# Patient Record
Sex: Male | Born: 1940
Health system: Southern US, Community
[De-identification: ages and names within clinical notes are randomized; demographics above are authoritative.]

## PROBLEM LIST (undated history)

## (undated) DIAGNOSIS — E785 Hyperlipidemia, unspecified: Secondary | ICD-10-CM

## (undated) DIAGNOSIS — IMO0001 Reserved for inherently not codable concepts without codable children: Secondary | ICD-10-CM

## (undated) DIAGNOSIS — B001 Herpesviral vesicular dermatitis: Secondary | ICD-10-CM

## (undated) DIAGNOSIS — I639 Cerebral infarction, unspecified: Secondary | ICD-10-CM

## (undated) DIAGNOSIS — R011 Cardiac murmur, unspecified: Secondary | ICD-10-CM

## (undated) DIAGNOSIS — Z5189 Encounter for other specified aftercare: Secondary | ICD-10-CM

## (undated) DIAGNOSIS — K222 Esophageal obstruction: Secondary | ICD-10-CM

## (undated) DIAGNOSIS — F4310 Post-traumatic stress disorder, unspecified: Secondary | ICD-10-CM

## (undated) DIAGNOSIS — D126 Benign neoplasm of colon, unspecified: Secondary | ICD-10-CM

## (undated) DIAGNOSIS — F329 Major depressive disorder, single episode, unspecified: Secondary | ICD-10-CM

## (undated) DIAGNOSIS — I219 Acute myocardial infarction, unspecified: Secondary | ICD-10-CM

## (undated) DIAGNOSIS — K5790 Diverticulosis of intestine, part unspecified, without perforation or abscess without bleeding: Secondary | ICD-10-CM

## (undated) DIAGNOSIS — M4150 Other secondary scoliosis, site unspecified: Secondary | ICD-10-CM

## (undated) DIAGNOSIS — R351 Nocturia: Secondary | ICD-10-CM

## (undated) DIAGNOSIS — T884XXA Failed or difficult intubation, initial encounter: Secondary | ICD-10-CM

## (undated) DIAGNOSIS — Z8719 Personal history of other diseases of the digestive system: Secondary | ICD-10-CM

## (undated) DIAGNOSIS — Z9889 Other specified postprocedural states: Secondary | ICD-10-CM

## (undated) DIAGNOSIS — M4180 Other forms of scoliosis, site unspecified: Secondary | ICD-10-CM

## (undated) DIAGNOSIS — T7840XA Allergy, unspecified, initial encounter: Secondary | ICD-10-CM

## (undated) DIAGNOSIS — N189 Chronic kidney disease, unspecified: Secondary | ICD-10-CM

## (undated) DIAGNOSIS — I259 Chronic ischemic heart disease, unspecified: Secondary | ICD-10-CM

## (undated) DIAGNOSIS — I10 Essential (primary) hypertension: Secondary | ICD-10-CM

## (undated) DIAGNOSIS — G478 Other sleep disorders: Secondary | ICD-10-CM

## (undated) DIAGNOSIS — H269 Unspecified cataract: Secondary | ICD-10-CM

## (undated) DIAGNOSIS — M47812 Spondylosis without myelopathy or radiculopathy, cervical region: Secondary | ICD-10-CM

## (undated) DIAGNOSIS — F0280 Dementia in other diseases classified elsewhere without behavioral disturbance: Secondary | ICD-10-CM

## (undated) DIAGNOSIS — G309 Alzheimer's disease, unspecified: Secondary | ICD-10-CM

## (undated) DIAGNOSIS — G4730 Sleep apnea, unspecified: Secondary | ICD-10-CM

## (undated) DIAGNOSIS — H9190 Unspecified hearing loss, unspecified ear: Secondary | ICD-10-CM

## (undated) DIAGNOSIS — R51 Headache: Secondary | ICD-10-CM

## (undated) DIAGNOSIS — S0990XA Unspecified injury of head, initial encounter: Secondary | ICD-10-CM

## (undated) DIAGNOSIS — C801 Malignant (primary) neoplasm, unspecified: Secondary | ICD-10-CM

## (undated) DIAGNOSIS — M1990 Unspecified osteoarthritis, unspecified site: Secondary | ICD-10-CM

## (undated) DIAGNOSIS — J189 Pneumonia, unspecified organism: Secondary | ICD-10-CM

## (undated) DIAGNOSIS — F419 Anxiety disorder, unspecified: Secondary | ICD-10-CM

## (undated) DIAGNOSIS — R0683 Snoring: Secondary | ICD-10-CM

## (undated) DIAGNOSIS — G2581 Restless legs syndrome: Secondary | ICD-10-CM

## (undated) DIAGNOSIS — K219 Gastro-esophageal reflux disease without esophagitis: Secondary | ICD-10-CM

## (undated) DIAGNOSIS — F32A Depression, unspecified: Secondary | ICD-10-CM

## (undated) HISTORY — DX: Allergy, unspecified, initial encounter: T78.40XA

## (undated) HISTORY — DX: Other forms of scoliosis, site unspecified: M41.80

## (undated) HISTORY — DX: Encounter for other specified aftercare: Z51.89

## (undated) HISTORY — PX: HERNIA REPAIR: SHX51

## (undated) HISTORY — PX: COLONOSCOPY: SHX174

## (undated) HISTORY — DX: Cerebral infarction, unspecified: I63.9

## (undated) HISTORY — DX: Spondylosis without myelopathy or radiculopathy, cervical region: M47.812

## (undated) HISTORY — DX: Anxiety disorder, unspecified: F41.9

## (undated) HISTORY — DX: Diverticulosis of intestine, part unspecified, without perforation or abscess without bleeding: K57.90

## (undated) HISTORY — DX: Pneumonia, unspecified organism: J18.9

## (undated) HISTORY — DX: Major depressive disorder, single episode, unspecified: F32.9

## (undated) HISTORY — DX: Restless legs syndrome: G25.81

## (undated) HISTORY — DX: Benign neoplasm of colon, unspecified: D12.6

## (undated) HISTORY — DX: Other secondary scoliosis, site unspecified: M41.50

## (undated) HISTORY — DX: Dementia in other diseases classified elsewhere without behavioral disturbance: F02.80

## (undated) HISTORY — DX: Malignant (primary) neoplasm, unspecified: C80.1

## (undated) HISTORY — DX: Esophageal obstruction: K22.2

## (undated) HISTORY — DX: Cardiac murmur, unspecified: R01.1

## (undated) HISTORY — PX: POLYPECTOMY: SHX149

## (undated) HISTORY — DX: Depression, unspecified: F32.A

## (undated) HISTORY — DX: Sleep apnea, unspecified: G47.30

## (undated) HISTORY — DX: Essential (primary) hypertension: I10

## (undated) HISTORY — DX: Hyperlipidemia, unspecified: E78.5

## (undated) HISTORY — PX: EYE SURGERY: SHX253

## (undated) HISTORY — DX: Gastro-esophageal reflux disease without esophagitis: K21.9

## (undated) HISTORY — DX: Alzheimer's disease, unspecified: G30.9

## (undated) HISTORY — DX: Post-traumatic stress disorder, unspecified: F43.10

---
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
## 1966-12-23 DIAGNOSIS — Z5189 Encounter for other specified aftercare: Secondary | ICD-10-CM

## 1966-12-23 DIAGNOSIS — IMO0001 Reserved for inherently not codable concepts without codable children: Secondary | ICD-10-CM

## 1966-12-23 HISTORY — DX: Reserved for inherently not codable concepts without codable children: IMO0001

## 1966-12-23 HISTORY — DX: Encounter for other specified aftercare: Z51.89

## 2000-12-23 DIAGNOSIS — I219 Acute myocardial infarction, unspecified: Secondary | ICD-10-CM

## 2000-12-23 DIAGNOSIS — F0280 Dementia in other diseases classified elsewhere without behavioral disturbance: Secondary | ICD-10-CM

## 2000-12-23 DIAGNOSIS — G478 Other sleep disorders: Secondary | ICD-10-CM

## 2000-12-23 HISTORY — DX: Acute myocardial infarction, unspecified: I21.9

## 2000-12-23 HISTORY — DX: Other sleep disorders: G47.8

## 2000-12-23 HISTORY — DX: Dementia in other diseases classified elsewhere, unspecified severity, without behavioral disturbance, psychotic disturbance, mood disturbance, and anxiety: F02.80

## 2001-01-22 ENCOUNTER — Encounter: Payer: Self-pay | Admitting: Internal Medicine

## 2001-02-20 ENCOUNTER — Other Ambulatory Visit: Admission: RE | Admit: 2001-02-20 | Discharge: 2001-02-20 | Payer: Self-pay | Admitting: Internal Medicine

## 2001-02-20 ENCOUNTER — Encounter: Payer: Self-pay | Admitting: Internal Medicine

## 2001-10-15 ENCOUNTER — Encounter: Payer: Self-pay | Admitting: Internal Medicine

## 2001-10-15 ENCOUNTER — Ambulatory Visit (HOSPITAL_COMMUNITY): Admission: RE | Admit: 2001-10-15 | Discharge: 2001-10-15 | Payer: Self-pay | Admitting: Internal Medicine

## 2002-01-22 HISTORY — PX: DRUG INDUCED ENDOSCOPY: SHX6808

## 2002-10-28 ENCOUNTER — Encounter: Payer: Self-pay | Admitting: Internal Medicine

## 2002-12-23 HISTORY — PX: OTHER SURGICAL HISTORY: SHX169

## 2003-09-08 ENCOUNTER — Encounter: Payer: Self-pay | Admitting: Family Medicine

## 2003-09-08 ENCOUNTER — Encounter: Admission: RE | Admit: 2003-09-08 | Discharge: 2003-09-08 | Payer: Self-pay | Admitting: Family Medicine

## 2003-10-17 ENCOUNTER — Encounter: Payer: Self-pay | Admitting: Critical Care Medicine

## 2003-10-17 ENCOUNTER — Ambulatory Visit (HOSPITAL_COMMUNITY): Admission: RE | Admit: 2003-10-17 | Discharge: 2003-10-17 | Payer: Self-pay | Admitting: Critical Care Medicine

## 2003-10-25 ENCOUNTER — Ambulatory Visit (HOSPITAL_COMMUNITY): Admission: RE | Admit: 2003-10-25 | Discharge: 2003-10-25 | Payer: Self-pay | Admitting: Critical Care Medicine

## 2003-10-25 ENCOUNTER — Encounter (INDEPENDENT_AMBULATORY_CARE_PROVIDER_SITE_OTHER): Payer: Self-pay | Admitting: Specialist

## 2003-10-25 HISTORY — PX: BRONCHOSCOPY: SUR163

## 2003-12-14 ENCOUNTER — Inpatient Hospital Stay (HOSPITAL_COMMUNITY): Admission: RE | Admit: 2003-12-14 | Discharge: 2003-12-14 | Payer: Self-pay | Admitting: Otolaryngology

## 2003-12-15 HISTORY — PX: UVULOPALATOPHARYNGOPLASTY: SHX827

## 2003-12-24 HISTORY — PX: SEPTOPLASTY: SUR1290

## 2004-03-13 ENCOUNTER — Encounter: Payer: Self-pay | Admitting: Internal Medicine

## 2004-05-04 ENCOUNTER — Inpatient Hospital Stay (HOSPITAL_COMMUNITY): Admission: AD | Admit: 2004-05-04 | Discharge: 2004-05-07 | Payer: Self-pay | Admitting: Internal Medicine

## 2004-08-29 ENCOUNTER — Ambulatory Visit: Admission: RE | Admit: 2004-08-29 | Discharge: 2004-08-29 | Payer: Self-pay | Admitting: Critical Care Medicine

## 2004-08-29 ENCOUNTER — Encounter (INDEPENDENT_AMBULATORY_CARE_PROVIDER_SITE_OTHER): Payer: Self-pay | Admitting: *Deleted

## 2004-10-23 ENCOUNTER — Ambulatory Visit: Payer: Self-pay | Admitting: Critical Care Medicine

## 2004-11-18 ENCOUNTER — Ambulatory Visit: Payer: Self-pay | Admitting: Hematology & Oncology

## 2004-11-23 ENCOUNTER — Ambulatory Visit: Payer: Self-pay | Admitting: Hematology & Oncology

## 2004-11-27 ENCOUNTER — Ambulatory Visit: Payer: Self-pay | Admitting: Infectious Diseases

## 2004-11-27 ENCOUNTER — Ambulatory Visit: Payer: Self-pay | Admitting: Critical Care Medicine

## 2004-11-27 ENCOUNTER — Inpatient Hospital Stay (HOSPITAL_COMMUNITY): Admission: RE | Admit: 2004-11-27 | Discharge: 2004-11-30 | Payer: Self-pay | Admitting: Critical Care Medicine

## 2004-11-29 ENCOUNTER — Encounter (INDEPENDENT_AMBULATORY_CARE_PROVIDER_SITE_OTHER): Payer: Self-pay | Admitting: *Deleted

## 2004-12-10 ENCOUNTER — Ambulatory Visit: Payer: Self-pay | Admitting: Critical Care Medicine

## 2004-12-23 HISTORY — PX: BUNIONECTOMY: SHX129

## 2004-12-31 ENCOUNTER — Encounter (HOSPITAL_COMMUNITY): Admission: RE | Admit: 2004-12-31 | Discharge: 2005-03-31 | Payer: Self-pay | Admitting: Critical Care Medicine

## 2005-01-15 ENCOUNTER — Ambulatory Visit: Payer: Self-pay | Admitting: Critical Care Medicine

## 2005-01-28 ENCOUNTER — Inpatient Hospital Stay (HOSPITAL_COMMUNITY): Admission: EM | Admit: 2005-01-28 | Discharge: 2005-01-31 | Payer: Self-pay | Admitting: Emergency Medicine

## 2005-01-28 ENCOUNTER — Ambulatory Visit: Payer: Self-pay | Admitting: Internal Medicine

## 2005-01-28 ENCOUNTER — Ambulatory Visit: Payer: Self-pay | Admitting: Infectious Diseases

## 2005-02-14 ENCOUNTER — Ambulatory Visit: Payer: Self-pay | Admitting: Critical Care Medicine

## 2005-02-28 ENCOUNTER — Ambulatory Visit (HOSPITAL_BASED_OUTPATIENT_CLINIC_OR_DEPARTMENT_OTHER): Admission: RE | Admit: 2005-02-28 | Discharge: 2005-02-28 | Payer: Self-pay | Admitting: Critical Care Medicine

## 2005-03-18 ENCOUNTER — Ambulatory Visit: Payer: Self-pay | Admitting: Internal Medicine

## 2005-03-20 ENCOUNTER — Ambulatory Visit: Payer: Self-pay | Admitting: Pulmonary Disease

## 2005-03-27 ENCOUNTER — Ambulatory Visit: Payer: Self-pay | Admitting: Critical Care Medicine

## 2005-03-29 ENCOUNTER — Ambulatory Visit: Payer: Self-pay | Admitting: Family Medicine

## 2005-04-18 ENCOUNTER — Ambulatory Visit: Payer: Self-pay | Admitting: Internal Medicine

## 2005-04-26 ENCOUNTER — Ambulatory Visit: Payer: Self-pay | Admitting: Family Medicine

## 2005-05-07 ENCOUNTER — Ambulatory Visit: Payer: Self-pay | Admitting: Internal Medicine

## 2005-05-13 ENCOUNTER — Ambulatory Visit: Payer: Self-pay | Admitting: Internal Medicine

## 2005-05-21 ENCOUNTER — Ambulatory Visit: Payer: Self-pay | Admitting: Internal Medicine

## 2005-08-05 ENCOUNTER — Ambulatory Visit: Payer: Self-pay | Admitting: Critical Care Medicine

## 2005-10-23 ENCOUNTER — Ambulatory Visit: Payer: Self-pay | Admitting: Internal Medicine

## 2005-12-06 ENCOUNTER — Ambulatory Visit: Payer: Self-pay | Admitting: Critical Care Medicine

## 2005-12-10 ENCOUNTER — Ambulatory Visit: Payer: Self-pay | Admitting: Internal Medicine

## 2005-12-23 HISTORY — PX: BUNIONECTOMY: SHX129

## 2005-12-23 HISTORY — PX: OTHER SURGICAL HISTORY: SHX169

## 2006-01-03 ENCOUNTER — Emergency Department (HOSPITAL_COMMUNITY): Admission: EM | Admit: 2006-01-03 | Discharge: 2006-01-03 | Payer: Self-pay | Admitting: Emergency Medicine

## 2006-01-29 ENCOUNTER — Ambulatory Visit: Payer: Self-pay | Admitting: Internal Medicine

## 2006-02-07 ENCOUNTER — Ambulatory Visit: Payer: Self-pay | Admitting: Internal Medicine

## 2006-04-30 ENCOUNTER — Ambulatory Visit: Payer: Self-pay | Admitting: Internal Medicine

## 2006-05-02 ENCOUNTER — Ambulatory Visit: Payer: Self-pay | Admitting: Critical Care Medicine

## 2006-08-29 ENCOUNTER — Ambulatory Visit: Payer: Self-pay | Admitting: Internal Medicine

## 2006-10-10 ENCOUNTER — Ambulatory Visit: Payer: Self-pay | Admitting: Internal Medicine

## 2006-11-18 ENCOUNTER — Ambulatory Visit: Payer: Self-pay | Admitting: Internal Medicine

## 2006-11-25 ENCOUNTER — Ambulatory Visit: Payer: Self-pay | Admitting: Cardiology

## 2006-11-26 ENCOUNTER — Ambulatory Visit: Payer: Self-pay | Admitting: Internal Medicine

## 2006-11-27 ENCOUNTER — Ambulatory Visit: Payer: Self-pay | Admitting: Internal Medicine

## 2006-11-27 LAB — CONVERTED CEMR LAB
BUN: 13 mg/dL (ref 6–23)
Calcium: 9.4 mg/dL (ref 8.4–10.5)
GFR calc non Af Amer: 90 mL/min
Glomerular Filtration Rate, Af Am: 109 mL/min/{1.73_m2}
Potassium: 3.4 meq/L — ABNORMAL LOW (ref 3.5–5.1)

## 2006-12-02 ENCOUNTER — Ambulatory Visit: Payer: Self-pay | Admitting: Internal Medicine

## 2006-12-10 ENCOUNTER — Ambulatory Visit: Payer: Self-pay | Admitting: Internal Medicine

## 2006-12-10 LAB — HM COLONOSCOPY

## 2007-02-09 ENCOUNTER — Ambulatory Visit: Payer: Self-pay | Admitting: Internal Medicine

## 2007-03-03 ENCOUNTER — Ambulatory Visit: Payer: Self-pay | Admitting: Internal Medicine

## 2007-03-03 LAB — CONVERTED CEMR LAB: Potassium: 4.3 meq/L (ref 3.5–5.1)

## 2007-03-27 ENCOUNTER — Encounter: Payer: Self-pay | Admitting: Internal Medicine

## 2007-03-27 ENCOUNTER — Ambulatory Visit: Payer: Self-pay | Admitting: Internal Medicine

## 2007-03-27 DIAGNOSIS — G4733 Obstructive sleep apnea (adult) (pediatric): Secondary | ICD-10-CM

## 2007-03-27 DIAGNOSIS — K219 Gastro-esophageal reflux disease without esophagitis: Secondary | ICD-10-CM | POA: Insufficient documentation

## 2007-03-27 DIAGNOSIS — H9190 Unspecified hearing loss, unspecified ear: Secondary | ICD-10-CM | POA: Insufficient documentation

## 2007-03-27 LAB — CONVERTED CEMR LAB
CO2: 27 meq/L (ref 19–32)
Chloride: 106 meq/L (ref 96–112)
Eosinophils Absolute: 0.3 10*3/uL (ref 0.0–0.7)
Glucose, Bld: 92 mg/dL (ref 70–99)
Lymphocytes Relative: 23 % (ref 12–46)
Lymphs Abs: 2.1 10*3/uL (ref 0.7–3.3)
Neutro Abs: 6 10*3/uL (ref 1.7–7.7)
Neutrophils Relative %: 67 % (ref 43–77)
Platelets: 212 10*3/uL (ref 150–400)
Potassium: 4.2 meq/L (ref 3.5–5.3)
Sodium: 143 meq/L (ref 135–145)
TSH: 0.686 microintl units/mL (ref 0.350–5.50)
WBC: 8.9 10*3/uL (ref 4.0–10.5)

## 2007-04-10 ENCOUNTER — Ambulatory Visit: Payer: Self-pay | Admitting: Internal Medicine

## 2007-04-10 ENCOUNTER — Encounter: Admission: RE | Admit: 2007-04-10 | Discharge: 2007-04-10 | Payer: Self-pay | Admitting: Internal Medicine

## 2007-04-10 ENCOUNTER — Encounter: Payer: Self-pay | Admitting: Internal Medicine

## 2007-04-17 ENCOUNTER — Ambulatory Visit: Payer: Self-pay | Admitting: Critical Care Medicine

## 2007-05-08 DIAGNOSIS — G2581 Restless legs syndrome: Secondary | ICD-10-CM | POA: Insufficient documentation

## 2007-05-13 ENCOUNTER — Ambulatory Visit: Payer: Self-pay | Admitting: Critical Care Medicine

## 2007-05-13 ENCOUNTER — Encounter: Payer: Self-pay | Admitting: Internal Medicine

## 2007-05-13 ENCOUNTER — Ambulatory Visit: Payer: Self-pay | Admitting: Internal Medicine

## 2007-05-13 DIAGNOSIS — I10 Essential (primary) hypertension: Secondary | ICD-10-CM

## 2007-05-14 ENCOUNTER — Ambulatory Visit: Payer: Self-pay | Admitting: Internal Medicine

## 2007-05-25 LAB — CONVERTED CEMR LAB
PSA: 0.82 ng/mL (ref 0.10–4.00)
Total CHOL/HDL Ratio: 5.8
Triglycerides: 152 mg/dL — ABNORMAL HIGH (ref 0–149)

## 2007-05-31 ENCOUNTER — Ambulatory Visit (HOSPITAL_BASED_OUTPATIENT_CLINIC_OR_DEPARTMENT_OTHER): Admission: RE | Admit: 2007-05-31 | Discharge: 2007-05-31 | Payer: Self-pay | Admitting: Internal Medicine

## 2007-06-03 ENCOUNTER — Ambulatory Visit: Payer: Self-pay | Admitting: Internal Medicine

## 2007-06-03 ENCOUNTER — Telehealth: Payer: Self-pay | Admitting: Internal Medicine

## 2007-06-06 ENCOUNTER — Ambulatory Visit: Payer: Self-pay | Admitting: Internal Medicine

## 2007-06-08 LAB — CONVERTED CEMR LAB
Creatinine, Ser: 0.8 mg/dL (ref 0.4–1.5)
Potassium: 4.3 meq/L (ref 3.5–5.1)
Sodium: 138 meq/L (ref 135–145)

## 2007-06-11 ENCOUNTER — Telehealth (INDEPENDENT_AMBULATORY_CARE_PROVIDER_SITE_OTHER): Payer: Self-pay | Admitting: *Deleted

## 2007-06-15 ENCOUNTER — Ambulatory Visit: Payer: Self-pay | Admitting: Internal Medicine

## 2007-06-18 ENCOUNTER — Ambulatory Visit: Payer: Self-pay | Admitting: Internal Medicine

## 2007-07-07 ENCOUNTER — Ambulatory Visit: Payer: Self-pay | Admitting: Internal Medicine

## 2007-07-10 ENCOUNTER — Telehealth (INDEPENDENT_AMBULATORY_CARE_PROVIDER_SITE_OTHER): Payer: Self-pay | Admitting: *Deleted

## 2007-07-17 ENCOUNTER — Ambulatory Visit: Payer: Self-pay | Admitting: Internal Medicine

## 2007-07-22 ENCOUNTER — Ambulatory Visit: Payer: Self-pay | Admitting: Family Medicine

## 2007-07-23 LAB — CONVERTED CEMR LAB
BUN: 16 mg/dL (ref 6–23)
CO2: 32 meq/L (ref 19–32)
GFR calc Af Amer: 109 mL/min
Glucose, Bld: 124 mg/dL — ABNORMAL HIGH (ref 70–99)
Potassium: 4 meq/L (ref 3.5–5.1)

## 2007-07-28 ENCOUNTER — Ambulatory Visit: Payer: Self-pay | Admitting: Internal Medicine

## 2007-08-03 ENCOUNTER — Telehealth (INDEPENDENT_AMBULATORY_CARE_PROVIDER_SITE_OTHER): Payer: Self-pay | Admitting: *Deleted

## 2007-08-13 ENCOUNTER — Ambulatory Visit: Payer: Self-pay | Admitting: Internal Medicine

## 2007-08-20 ENCOUNTER — Telehealth (INDEPENDENT_AMBULATORY_CARE_PROVIDER_SITE_OTHER): Payer: Self-pay | Admitting: *Deleted

## 2007-08-20 ENCOUNTER — Encounter: Payer: Self-pay | Admitting: Internal Medicine

## 2007-08-28 ENCOUNTER — Telehealth: Payer: Self-pay | Admitting: Internal Medicine

## 2007-08-28 ENCOUNTER — Ambulatory Visit: Payer: Self-pay | Admitting: Internal Medicine

## 2007-08-28 ENCOUNTER — Encounter: Payer: Self-pay | Admitting: Internal Medicine

## 2007-08-28 DIAGNOSIS — K449 Diaphragmatic hernia without obstruction or gangrene: Secondary | ICD-10-CM | POA: Insufficient documentation

## 2007-08-31 ENCOUNTER — Encounter: Payer: Self-pay | Admitting: Internal Medicine

## 2007-09-16 ENCOUNTER — Encounter: Payer: Self-pay | Admitting: Internal Medicine

## 2007-11-16 ENCOUNTER — Ambulatory Visit: Payer: Self-pay | Admitting: Internal Medicine

## 2007-11-30 ENCOUNTER — Encounter: Payer: Self-pay | Admitting: Internal Medicine

## 2007-11-30 ENCOUNTER — Ambulatory Visit: Payer: Self-pay | Admitting: Internal Medicine

## 2007-12-29 ENCOUNTER — Telehealth: Payer: Self-pay | Admitting: Internal Medicine

## 2008-02-04 ENCOUNTER — Ambulatory Visit: Payer: Self-pay | Admitting: Internal Medicine

## 2008-02-10 LAB — CONVERTED CEMR LAB
Basophils Relative: 0.5 % (ref 0.0–1.0)
CO2: 32 meq/L (ref 19–32)
Eosinophils Relative: 6 % — ABNORMAL HIGH (ref 0.0–5.0)
GFR calc Af Amer: 124 mL/min
Glucose, Bld: 91 mg/dL (ref 70–99)
HCT: 42.6 % (ref 39.0–52.0)
HDL: 44.3 mg/dL (ref >39.0)
Hemoglobin: 14.4 g/dL (ref 13.0–17.0)
Lymphocytes Relative: 26.3 % (ref 12.0–46.0)
Monocytes Absolute: 0.5 10*3/uL (ref 0.2–0.7)
Neutro Abs: 4.3 10*3/uL (ref 1.4–7.7)
PSA: 0.86 ng/mL (ref 0.10–4.00)
Potassium: 3.9 meq/L (ref 3.5–5.1)
VLDL: 8 mg/dL (ref 0–40)
WBC: 7 10*3/uL (ref 4.5–10.5)

## 2008-03-17 ENCOUNTER — Telehealth: Payer: Self-pay | Admitting: Internal Medicine

## 2008-05-23 ENCOUNTER — Ambulatory Visit: Payer: Self-pay | Admitting: Internal Medicine

## 2008-06-17 ENCOUNTER — Ambulatory Visit: Payer: Self-pay | Admitting: Internal Medicine

## 2008-06-17 ENCOUNTER — Telehealth (INDEPENDENT_AMBULATORY_CARE_PROVIDER_SITE_OTHER): Payer: Self-pay | Admitting: *Deleted

## 2008-06-17 ENCOUNTER — Encounter: Payer: Self-pay | Admitting: Internal Medicine

## 2008-06-17 ENCOUNTER — Telehealth: Payer: Self-pay | Admitting: Internal Medicine

## 2008-06-17 LAB — CONVERTED CEMR LAB
ALT: 31 units/L (ref 0–53)
AST: 25 units/L (ref 0–37)
Albumin: 4.6 g/dL (ref 3.5–5.2)
Alkaline Phosphatase: 72 units/L (ref 39–117)
Bilirubin Urine: NEGATIVE
CO2: 24 meq/L (ref 19–32)
Calcium: 9.7 mg/dL (ref 8.4–10.5)
Chloride: 104 meq/L (ref 96–112)
Eosinophils Absolute: 0.4 10*3/uL (ref 0.0–0.7)
Glucose, Bld: 76 mg/dL (ref 70–99)
Indirect Bilirubin: 0.3 mg/dL (ref 0.0–0.9)
Ketones, urine, test strip: NEGATIVE
Lymphocytes Relative: 21 % (ref 12–46)
Lymphs Abs: 2.1 10*3/uL (ref 0.7–4.0)
MCV: 88.3 fL (ref 78.0–100.0)
Monocytes Relative: 5 % (ref 3–12)
Neutrophils Relative %: 70 % (ref 43–77)
Potassium: 4 meq/L (ref 3.5–5.3)
Protein, U semiquant: NEGATIVE
RBC: 4.89 M/uL (ref 4.22–5.81)
Sodium: 143 meq/L (ref 135–145)
Total Protein: 6.9 g/dL (ref 6.0–8.3)
Urobilinogen, UA: NEGATIVE
WBC: 9.9 10*3/uL (ref 4.0–10.5)
pH: 6

## 2008-06-20 ENCOUNTER — Encounter: Payer: Self-pay | Admitting: Internal Medicine

## 2008-06-22 ENCOUNTER — Encounter (INDEPENDENT_AMBULATORY_CARE_PROVIDER_SITE_OTHER): Payer: Self-pay | Admitting: *Deleted

## 2008-06-23 ENCOUNTER — Encounter (INDEPENDENT_AMBULATORY_CARE_PROVIDER_SITE_OTHER): Payer: Self-pay | Admitting: *Deleted

## 2008-07-01 ENCOUNTER — Ambulatory Visit: Payer: Self-pay | Admitting: Internal Medicine

## 2008-07-01 DIAGNOSIS — F329 Major depressive disorder, single episode, unspecified: Secondary | ICD-10-CM

## 2008-07-14 ENCOUNTER — Ambulatory Visit: Payer: Self-pay | Admitting: Licensed Clinical Social Worker

## 2008-07-18 ENCOUNTER — Telehealth (INDEPENDENT_AMBULATORY_CARE_PROVIDER_SITE_OTHER): Payer: Self-pay | Admitting: *Deleted

## 2008-07-21 ENCOUNTER — Ambulatory Visit: Payer: Self-pay | Admitting: Licensed Clinical Social Worker

## 2008-07-26 ENCOUNTER — Ambulatory Visit: Payer: Self-pay | Admitting: Internal Medicine

## 2008-07-28 ENCOUNTER — Ambulatory Visit: Payer: Self-pay | Admitting: Licensed Clinical Social Worker

## 2008-08-23 ENCOUNTER — Telehealth (INDEPENDENT_AMBULATORY_CARE_PROVIDER_SITE_OTHER): Payer: Self-pay | Admitting: *Deleted

## 2008-08-23 ENCOUNTER — Encounter: Payer: Self-pay | Admitting: Internal Medicine

## 2008-08-24 ENCOUNTER — Encounter: Payer: Self-pay | Admitting: Internal Medicine

## 2008-08-24 ENCOUNTER — Encounter (INDEPENDENT_AMBULATORY_CARE_PROVIDER_SITE_OTHER): Payer: Self-pay | Admitting: *Deleted

## 2008-08-25 ENCOUNTER — Encounter: Payer: Self-pay | Admitting: Internal Medicine

## 2008-09-02 ENCOUNTER — Ambulatory Visit: Payer: Self-pay | Admitting: Internal Medicine

## 2008-09-15 ENCOUNTER — Ambulatory Visit: Payer: Self-pay | Admitting: Internal Medicine

## 2008-10-06 ENCOUNTER — Encounter: Payer: Self-pay | Admitting: Internal Medicine

## 2008-10-06 ENCOUNTER — Telehealth (INDEPENDENT_AMBULATORY_CARE_PROVIDER_SITE_OTHER): Payer: Self-pay | Admitting: *Deleted

## 2008-11-14 ENCOUNTER — Ambulatory Visit: Payer: Self-pay | Admitting: Internal Medicine

## 2008-12-05 ENCOUNTER — Encounter: Payer: Self-pay | Admitting: Internal Medicine

## 2008-12-05 ENCOUNTER — Ambulatory Visit: Payer: Self-pay | Admitting: Internal Medicine

## 2008-12-05 DIAGNOSIS — L821 Other seborrheic keratosis: Secondary | ICD-10-CM | POA: Insufficient documentation

## 2008-12-09 ENCOUNTER — Telehealth (INDEPENDENT_AMBULATORY_CARE_PROVIDER_SITE_OTHER): Payer: Self-pay | Admitting: *Deleted

## 2008-12-23 HISTORY — PX: OTHER SURGICAL HISTORY: SHX169

## 2009-04-05 ENCOUNTER — Ambulatory Visit: Payer: Self-pay | Admitting: Internal Medicine

## 2009-04-05 DIAGNOSIS — M949 Disorder of cartilage, unspecified: Secondary | ICD-10-CM

## 2009-04-05 DIAGNOSIS — M899 Disorder of bone, unspecified: Secondary | ICD-10-CM | POA: Insufficient documentation

## 2009-04-05 LAB — CONVERTED CEMR LAB: Vit D, 25-Hydroxy: 45 ng/mL (ref 30–89)

## 2009-04-10 LAB — CONVERTED CEMR LAB
BUN: 19 mg/dL (ref 6–23)
CO2: 36 meq/L — ABNORMAL HIGH (ref 19–32)
Chloride: 103 meq/L (ref 96–112)
Creatinine, Ser: 0.8 mg/dL (ref 0.4–1.5)
Direct LDL: 168.2 mg/dL
Glucose, Bld: 94 mg/dL (ref 70–99)
Hemoglobin: 14.6 g/dL (ref 13.0–17.0)
Potassium: 4.6 meq/L (ref 3.5–5.1)
Total CHOL/HDL Ratio: 6
VLDL: 29.2 mg/dL (ref 0.0–40.0)

## 2009-04-20 ENCOUNTER — Encounter: Payer: Self-pay | Admitting: Internal Medicine

## 2009-07-26 ENCOUNTER — Telehealth: Payer: Self-pay | Admitting: Internal Medicine

## 2009-10-04 ENCOUNTER — Ambulatory Visit: Payer: Self-pay | Admitting: Internal Medicine

## 2009-10-04 DIAGNOSIS — E785 Hyperlipidemia, unspecified: Secondary | ICD-10-CM | POA: Insufficient documentation

## 2009-10-05 ENCOUNTER — Telehealth (INDEPENDENT_AMBULATORY_CARE_PROVIDER_SITE_OTHER): Payer: Self-pay | Admitting: *Deleted

## 2009-10-09 LAB — CONVERTED CEMR LAB
BUN: 16 mg/dL (ref 6–23)
CO2: 33 meq/L — ABNORMAL HIGH (ref 19–32)
Chloride: 102 meq/L (ref 96–112)
Cholesterol: 202 mg/dL — ABNORMAL HIGH (ref 0–200)
Direct LDL: 157.9 mg/dL
Glucose, Bld: 84 mg/dL (ref 70–99)
Potassium: 4.7 meq/L (ref 3.5–5.1)
VLDL: 29 mg/dL (ref 0.0–40.0)

## 2009-10-24 ENCOUNTER — Ambulatory Visit: Payer: Self-pay | Admitting: Internal Medicine

## 2009-10-25 ENCOUNTER — Telehealth (INDEPENDENT_AMBULATORY_CARE_PROVIDER_SITE_OTHER): Payer: Self-pay | Admitting: *Deleted

## 2009-10-25 ENCOUNTER — Telehealth: Payer: Self-pay | Admitting: Internal Medicine

## 2009-10-25 LAB — CONVERTED CEMR LAB
Basophils Relative: 0.1 % (ref 0.0–3.0)
Eosinophils Absolute: 0.4 10*3/uL (ref 0.0–0.7)
HCT: 39.4 % (ref 39.0–52.0)
Hemoglobin: 14.1 g/dL (ref 13.0–17.0)
Lymphocytes Relative: 14.1 % (ref 12.0–46.0)
Lymphs Abs: 1.8 10*3/uL (ref 0.7–4.0)
MCHC: 35.8 g/dL (ref 30.0–36.0)
Monocytes Relative: 7.2 % (ref 3.0–12.0)
Neutro Abs: 9.6 10*3/uL — ABNORMAL HIGH (ref 1.4–7.7)
RBC: 4.23 M/uL (ref 4.22–5.81)

## 2009-10-26 ENCOUNTER — Ambulatory Visit: Payer: Self-pay | Admitting: Gastroenterology

## 2009-10-26 DIAGNOSIS — Z860100 Personal history of colon polyps, unspecified: Secondary | ICD-10-CM | POA: Insufficient documentation

## 2009-10-26 DIAGNOSIS — R6881 Early satiety: Secondary | ICD-10-CM

## 2009-10-26 DIAGNOSIS — K5730 Diverticulosis of large intestine without perforation or abscess without bleeding: Secondary | ICD-10-CM | POA: Insufficient documentation

## 2009-10-31 ENCOUNTER — Ambulatory Visit (HOSPITAL_COMMUNITY): Admission: RE | Admit: 2009-10-31 | Discharge: 2009-10-31 | Payer: Self-pay | Admitting: Gastroenterology

## 2009-11-22 ENCOUNTER — Ambulatory Visit: Payer: Self-pay | Admitting: Internal Medicine

## 2009-11-28 ENCOUNTER — Telehealth: Payer: Self-pay | Admitting: Internal Medicine

## 2009-12-13 ENCOUNTER — Telehealth: Payer: Self-pay | Admitting: Internal Medicine

## 2009-12-23 HISTORY — PX: NISSEN FUNDOPLICATION: SHX2091

## 2010-01-09 ENCOUNTER — Encounter: Admission: RE | Admit: 2010-01-09 | Discharge: 2010-01-09 | Payer: Self-pay | Admitting: Surgery

## 2010-01-19 ENCOUNTER — Inpatient Hospital Stay (HOSPITAL_COMMUNITY): Admission: RE | Admit: 2010-01-19 | Discharge: 2010-01-23 | Payer: Self-pay | Admitting: Surgery

## 2010-03-21 ENCOUNTER — Ambulatory Visit: Payer: Self-pay | Admitting: Internal Medicine

## 2010-03-23 LAB — CONVERTED CEMR LAB
Basophils Absolute: 0.1 10*3/uL (ref 0.0–0.1)
Eosinophils Absolute: 0.4 10*3/uL (ref 0.0–0.7)
HCT: 45.6 % (ref 39.0–52.0)
Hemoglobin: 15 g/dL (ref 13.0–17.0)
Lymphs Abs: 2 10*3/uL (ref 0.7–4.0)
MCHC: 32.9 g/dL (ref 30.0–36.0)
Neutro Abs: 3.8 10*3/uL (ref 1.4–7.7)
RDW: 12.6 % (ref 11.5–14.6)

## 2010-03-26 ENCOUNTER — Ambulatory Visit: Payer: Self-pay | Admitting: Internal Medicine

## 2010-03-26 ENCOUNTER — Encounter: Payer: Self-pay | Admitting: Internal Medicine

## 2010-04-04 ENCOUNTER — Ambulatory Visit: Payer: Self-pay | Admitting: Internal Medicine

## 2010-04-05 ENCOUNTER — Encounter: Payer: Self-pay | Admitting: Internal Medicine

## 2010-04-09 LAB — CONVERTED CEMR LAB
BUN: 15 mg/dL (ref 6–23)
Calcium: 9.7 mg/dL (ref 8.4–10.5)
Creatinine, Ser: 0.8 mg/dL (ref 0.4–1.5)
GFR calc non Af Amer: 101.91 mL/min (ref >60)
Glucose, Bld: 92 mg/dL (ref 70–99)

## 2010-04-19 ENCOUNTER — Encounter: Payer: Self-pay | Admitting: Internal Medicine

## 2010-04-23 ENCOUNTER — Telehealth: Payer: Self-pay | Admitting: Internal Medicine

## 2010-07-02 ENCOUNTER — Encounter: Payer: Self-pay | Admitting: Internal Medicine

## 2010-07-04 ENCOUNTER — Encounter: Payer: Self-pay | Admitting: Internal Medicine

## 2010-07-30 ENCOUNTER — Telehealth: Payer: Self-pay | Admitting: Internal Medicine

## 2010-08-20 ENCOUNTER — Encounter: Payer: Self-pay | Admitting: Internal Medicine

## 2010-09-17 ENCOUNTER — Inpatient Hospital Stay (HOSPITAL_COMMUNITY): Admission: RE | Admit: 2010-09-17 | Discharge: 2010-09-20 | Payer: Self-pay | Admitting: Neurosurgery

## 2010-09-17 HISTORY — PX: OTHER SURGICAL HISTORY: SHX169

## 2010-10-08 ENCOUNTER — Ambulatory Visit: Payer: Self-pay | Admitting: Internal Medicine

## 2010-10-08 DIAGNOSIS — B009 Herpesviral infection, unspecified: Secondary | ICD-10-CM | POA: Insufficient documentation

## 2010-10-11 ENCOUNTER — Encounter: Payer: Self-pay | Admitting: Internal Medicine

## 2010-10-11 LAB — CONVERTED CEMR LAB
CO2: 30 meq/L (ref 19–32)
Calcium: 9.9 mg/dL (ref 8.4–10.5)
Chloride: 102 meq/L (ref 96–112)
Sodium: 140 meq/L (ref 135–145)
Total CHOL/HDL Ratio: 6

## 2010-10-30 ENCOUNTER — Encounter: Admission: RE | Admit: 2010-10-30 | Discharge: 2010-10-30 | Payer: Self-pay | Admitting: Neurosurgery

## 2010-11-01 ENCOUNTER — Encounter: Payer: Self-pay | Admitting: Internal Medicine

## 2010-12-23 HISTORY — PX: OTHER SURGICAL HISTORY: SHX169

## 2010-12-27 ENCOUNTER — Telehealth: Payer: Self-pay | Admitting: Internal Medicine

## 2011-01-04 ENCOUNTER — Encounter: Payer: Self-pay | Admitting: Internal Medicine

## 2011-01-08 ENCOUNTER — Encounter
Admission: RE | Admit: 2011-01-08 | Discharge: 2011-01-08 | Payer: Self-pay | Source: Home / Self Care | Attending: Neurosurgery | Admitting: Neurosurgery

## 2011-01-17 ENCOUNTER — Other Ambulatory Visit: Payer: Self-pay | Admitting: Neurosurgery

## 2011-01-17 DIAGNOSIS — Z981 Arthrodesis status: Secondary | ICD-10-CM

## 2011-01-24 NOTE — Medication Information (Signed)
Summary: Patient Assistance Form/Reclast  Patient Assistance Form/Reclast   Imported By: Lanelle Bal 04/27/2010 10:49:15  _____________________________________________________________________  External Attachment:    Type:   Image     Comment:   External Document

## 2011-01-24 NOTE — Progress Notes (Signed)
Summary: [facility] refill  Phone Note Refill Request Message from:  Fax from Pharmacy on December 27, 2010 9:56 AM  Refills Requested: Medication #1:  HYDROCHLOROTHIAZIDE 25 MG  TABS 1 by mouth once daily   Notes: [facility]  Medication #2:  CELEBREX 200 MG CAPS 1 by mouth bid  Medication #3:  MICARDIS 80 MG  TABS 2 p.o. daily Initial call taken by: Lucious Groves CMA,  December 27, 2010 9:57 AM    Prescriptions: CELEBREX 200 MG CAPS (CELECOXIB) 1 by mouth bid  #180 x 2   Entered by:   Lucious Groves CMA   Authorized by:   Nolon Rod. Arville Postlewaite MD   Signed by:   Lucious Groves CMA on 12/27/2010   Method used:   Faxed to ...       [facility]             , [place]         Ph: [phone]       Fax: [phone]   RxID:   [id number] MICARDIS 80 MG  TABS (TELMISARTAN) 2 p.o. daily  #180 x 2   Entered by:   Lucious Groves CMA   Authorized by:   Nolon Rod. Darlys Buis MD   Signed by:   Lucious Groves CMA on 12/27/2010   Method used:   Faxed to ...       [facility]             , [place]         Ph: [phone]       Fax: [phone]   RxID:   [id number] HYDROCHLOROTHIAZIDE 25 MG  TABS (HYDROCHLOROTHIAZIDE) 1 by mouth once daily  #90 x 2   Entered by:   Lucious Groves CMA   Authorized by:   Nolon Rod. Zelina Jimerson MD   Signed by:   Lucious Groves CMA on 12/27/2010   Method used:   Faxed to ...       [facility]             , [place]         Ph: [phone]       Fax: [phone]   RxID:   [id number]

## 2011-01-24 NOTE — Letter (Signed)
Summary: back pain, Rx  epidural---orthopedic surgery  [facility]   Imported By: Lanelle Bal 07/11/2010 11:18:13  _____________________________________________________________________  External Attachment:    Type:   Image     Comment:   External Document

## 2011-01-24 NOTE — Assessment & Plan Note (Signed)
Summary: 6 MTH FU/KDC   Vital Signs:  Patient profile:   70 year old male Weight:      191 pounds Pulse rate:   103 / minute Pulse rhythm:   regular BP sitting:   138 / 82  (left arm) Cuff size:   large  Vitals Entered By: Army Fossa CMA (October 08, 2010 8:34 AM) CC: 6 month f/u -fasting  [facility] [address]   History of Present Illness: ROV  HYPERTENSION-- good ambulatory BPs , 120-140/80   BACK PAIN -- after local shots failed, had surgery, see PSH  has a lesion on the nose, increase in size?Marland Kitchen Would like a referral  During the wintertime, he has many episodes of "fever blisters" would like a prescription for Valtrex  Osteopenia--last bone density test  4 / 2011, showed osteopenia, declined reclast. States that orthopedic surgery is following up on that  Current Medications (verified): 1)  Hydrochlorothiazide 25 Mg  Tabs (Hydrochlorothiazide) .Marland Kitchen.. 1 By Mouth Once Daily 2)  Micardis 80 Mg  Tabs (Telmisartan) .... 2 P.o. Daily 3)  Celebrex 200 Mg Caps (Celecoxib) .Marland Kitchen.. 1 By Mouth Bid 4)  Mangosteen .Marland Kitchen.. 1 By Mouth Once Daily Prn 5)  Multivitamins  Tabs (Multiple Vitamin) .Marland Kitchen.. 1 By Mouth Once Daily 6)  Oxycodone-Acetaminophen 5-325 Mg Tabs (Oxycodone-Acetaminophen) .Marland Kitchen.. 1-2 Tabs X 4-6 Hrs As Needed 7)  Flexeril 10 Mg Tabs (Cyclobenzaprine Hcl) .Marland Kitchen.. 1 By Mouth Daily. 8)  Pataday 0.2 % Soln (Olopatadine Hcl) .Marland Kitchen.. 1 Drop Daily.  Allergies (verified): 1)  ! * Calcium Channel Blockers 2)  * Penicillins Group 3)  * Vioxx (Rofecoxib) 4)  Felodipine (Felodipine)  Past History:  Past Medical History: GERD  HYPERTENSION  RESTLESS LEG SYNDROME  BACK PAIN  SPONDYLOSIS, CERVICAL SLEEP APNEA, CHRONIC ,CPAP intolerant DECREASED HEARING  COLONOSCOPY: last 11/2006, TICS , no polyp; next 2011/HYPERPLASTIC 2005 Hx of PNEUMONIA, RECURRENT , bronchoscopy 2005: Chronic organized pneumonia, Dr Delford Field Dx w/ [facility] DISEASE (2002 Dr Barb Merino  resolved Depression Osteopenia  Past Surgical History: Tonsillectomy uvuloplasty 2004 Foot surgery 2006 Nissen Fundoplication 12/2009 back fusion L 3-4-5 09-17-10 DR Wynetta Emery   Social History: Reviewed history from 04/04/2010 and no changes required. 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 CV:  Denies chest pain or discomfort and swelling of feet. Resp:  Denies cough and shortness of breath. GI:  Denies bloody stools, nausea, and vomiting.  Physical Exam  General:  alert and well-developed.   Nose:  he has a 3 mm soft mass in the right side of the nose consistent with a cyst. Skin over the cyst seems normal Lungs:  normal respiratory effort, no intercostal retractions, no accessory muscle use, and normal breath sounds.   Heart:  normal rate, regular rhythm, no murmur, and no gallop.   Extremities:  no edema Psych:  Oriented X3, memory intact for recent and remote, normally interactive, good eye contact, not anxious appearing, and not depressed appearing.     Impression & Recommendations:  Problem # 1:  HYPERLIPIDEMIA, MILD (ICD-272.4) labs   Labs Reviewed: SGOT: 26 (04/04/2010)   SGPT: 25 (04/04/2010)   HDL:33.90 (10/04/2009), 36.00 (04/05/2009)  LDL:122 (02/04/2008), 134 (05/13/2007)  Chol:202 (10/04/2009), 232 (  04/05/2009)  Trig:145.0 (10/04/2009), 146.0 (04/05/2009)  Orders: Venipuncture (78295) TLB-Lipid Panel (80061-LIPID) Specimen Handling (62130)  Problem # 2:  OSTEOPENIA (ICD-733.90) last bone density test 03/2010, showed osteopenia, declined reclast. States that orthopedic surgery is following up on that  Problem # 3:  HYPERTENSION (ICD-401.9) at goal, labs His updated  medication list for this problem includes:    Hydrochlorothiazide 25 Mg Tabs (Hydrochlorothiazide) .Marland Kitchen... 1 by mouth once daily    Micardis 80 Mg Tabs (Telmisartan) .Marland Kitchen... 2 p.o. daily    BP today: 138/82 Prior BP: 140/72 (04/04/2010)  Labs Reviewed: K+: 3.8 (04/04/2010) Creat: : 0.8 (04/04/2010)   Chol: 202 (10/04/2009)   HDL: 33.90 (10/04/2009)   LDL: 122 (02/04/2008)   TG: 145.0 (10/04/2009)  Orders: TLB-BMP (Basic Metabolic Panel-BMET) (80048-METABOL) Specimen Handling (86578)  Problem # 4:  BACK PAIN (ICD-724.5) status post surgery September 2011, so far doing well The following medications were removed from the medication list:    Tylenol Extra Strength 500 Mg Tabs (Acetaminophen) .Marland Kitchen... Take as needed His updated medication list for this problem includes:    Celebrex 200 Mg Caps (Celecoxib) .Marland Kitchen... 1 by mouth bid    Oxycodone-acetaminophen 5-325 Mg Tabs (Oxycodone-acetaminophen) .Marland Kitchen... 1-2 tabs x 4-6 hrs as needed    Flexeril 10 Mg Tabs (Cyclobenzaprine hcl) .Marland Kitchen... 1 by mouth daily.  Problem # 5:  FEVER BLISTER (ICD-054.9) Assessment: New symptoms usually more frequent during the wintertime, request Valtrex. Done   Problem # 6:  ? of SEBACEOUS CYST (ICD-706.2)  sebaceous cyst in the nose? Refer to ENT  Orders: ENT Referral (ENT)  Complete Medication List: 1)  Hydrochlorothiazide 25 Mg Tabs (Hydrochlorothiazide) .Marland Kitchen.. 1 by mouth once daily 2)  Micardis 80 Mg Tabs (Telmisartan) .... 2 p.o. daily 3)  Celebrex 200 Mg Caps (Celecoxib) .Marland Kitchen.. 1 by mouth bid 4)  Mangosteen  .Marland Kitchen.. 1 by mouth once daily prn 5)  Multivitamins Tabs (Multiple vitamin) .Marland Kitchen.. 1 by mouth once daily 6)  Oxycodone-acetaminophen 5-325 Mg Tabs (Oxycodone-acetaminophen) .Marland Kitchen.. 1-2 tabs x 4-6 hrs as needed 7)  Flexeril 10 Mg Tabs (Cyclobenzaprine hcl) .Marland Kitchen.. 1 by mouth daily. 8)  Pataday 0.2 % Soln (Olopatadine hcl) .Marland Kitchen.. 1 drop daily. 9)  Valtrex 1 Gm Tabs (Valacyclovir hcl) .... 2 tablets twice a day for one day  with the onset of fever blisters  Patient Instructions: 1)  Please schedule a follow-up appointment in 4 to 5  months .  Prescriptions: VALTREX 1 GM TABS (VALACYCLOVIR HCL) 2 tablets twice a day for one day with the onset of fever blisters  #30 x 0   Entered and Authorized by:   Nolon Rod. Dayane Hillenburg MD   Signed by:   Nolon Rod. Lille Karim MD on 10/08/2010   Method used:   [facility]        [facility] [phone]* (retail)       [address]       [place], [place]  [address]       Ph: [phone]       Fax: [phone]   RxID:   [id number]    Orders Added: 1)  Venipuncture [29518] 2)  TLB-Lipid Panel [80061-LIPID] 3)  TLB-BMP (Basic Metabolic Panel-BMET) [80048-METABOL] 4)  Specimen Handling [99000] 5)  ENT Referral [ENT] 6)  Est. Patient Level IV [84166]   Immunization History:  Influenza Immunization History:    Influenza:  historical (09/19/2010)   Immunization History:  Influenza Immunization History:  Influenza:  Historical (09/19/2010)

## 2011-01-24 NOTE — Miscellaneous (Signed)
Summary: BONE DENSITY  Clinical Lists Changes  Orders: Added new Test order of T-Bone Densitometry (77080) - Signed Added new Test order of T-Lumbar Vertebral Assessment (77082) - Signed 

## 2011-01-24 NOTE — Assessment & Plan Note (Signed)
Summary: 6 mth fu/ns/kdc   Vital Signs:  Patient profile:   70 year old male Height:      66.5 inches Weight:      187.6 pounds BMI:     29.93 Pulse rate:   76 / minute BP sitting:   140 / 72  Vitals Entered By: Shary Decamp (April 04, 2010 8:34 AM) CC: ROV, FASTING   History of Present Illness: yearly, chart reviewed   was seen w/ dyspnea few days ago; feels better, SOB back to baseline, no F, no cough anymore   GERD-- symptoms improved , s/p Nissan fundaplic. 1-11, has hicups occasionally   HYPERTENSION -- ambulatory BPs 120-140/60-70   Depression-- mood ok, varies through the year, mood worse in the winter     Current Medications (verified): 1)  Hydrochlorothiazide 25 Mg  Tabs (Hydrochlorothiazide) .Marland Kitchen.. 1 By Mouth Once Daily 2)  Micardis 80 Mg  Tabs (Telmisartan) .... 2 P.o. Daily 3)  Celebrex 200 Mg Caps (Celecoxib) .Marland Kitchen.. 1 By Mouth Bid 4)  Calcium--Vit D--Mvi 5)  Mangosteen .Marland Kitchen.. 1 By Mouth Once Daily Prn 6)  Multivitamins  Tabs (Multiple Vitamin) .Marland Kitchen.. 1 By Mouth Once Daily 7)  Tylenol Extra Strength 500 Mg Tabs (Acetaminophen) .... Take As Needed 8)  Folic Acid  Allergies (verified): 1)  ! * Calcium Channel Blockers 2)  * Penicillins Group 3)  * Vioxx (Rofecoxib) 4)  Felodipine (Felodipine)  Past History:  Past Medical History: GERD  HYPERTENSION  RESTLESS LEG SYNDROME  BACK PAIN  SPONDYLOSIS, CERVICAL SLEEP APNEA, CHRONIC ,CPAP intolerant DECREASED HEARING  COLONOSCOPY: last 11/2006, TICS , no polyp; next 2011/HYPERPLASTIC 2005 Hx of PNEUMONIA, RECURRENT , bronchoscopy 2005: Chronic organized pneumonia, Dr Delford Field Dx w/ [facility] DISEASE (2002 Dr Barb Merino resolved Depression Osteopenia  Past Surgical History: Reviewed history from 03/21/2010 and no changes required. 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 CV:  Denies chest pain or discomfort; occasionally LE edema (mostly feet particulalrly if inactive). GI:  Denies bloody stools, nausea, and vomiting; occasionally diarrhea , no blood . GU:  Denies dysuria, hematuria, urinary frequency, and urinary hesitancy.  Physical Exam  General:  alert, well-developed, and well-nourished.   Neck:  no masses and no thyromegaly.   Lungs:  normal respiratory effort, no intercostal retractions, no accessory muscle use, and normal breath sounds.   Heart:  normal rate, regular rhythm, no murmur, and no gallop.   Abdomen:  soft, non-tender, no distention, and no masses.   Rectal:  external hemorrhoids noted. Normal sphincter tone. No rectal masses or tenderness. no stools found Prostate:  Prostate gland firm and smooth, no enlargement, nodularity, tenderness, mass, asymmetry or induration. Extremities:  no edema Psych:  Oriented X3, memory intact for recent and remote, normally interactive, good eye contact, not anxious appearing, and not depressed appearing.     Impression & Recommendations:  Problem # 1:  HYPERLIPIDEMIA, MILD (ICD-272.4)  Labs Reviewed: SGOT: 26 (04/05/2009)   SGPT: 34 (04/05/2009)   HDL:33.90 (  10/04/2009), 36.00 (04/05/2009)  LDL:122 (02/04/2008), 134 (05/13/2007)  Chol:202 (10/04/2009), 232 (04/05/2009)  Trig:145.0 (10/04/2009), 146.0 (04/05/2009)  Orders: TLB-ALT (SGPT) (84460-ALT) TLB-AST (SGOT) (84450-SGOT)  Problem # 2:  HEALTH SCREENING (ICD-V70.0) Td 2002 pneumonia shot   4-10 2008 had shingles shot   Cscope several Cscopes due to polyps, last 12-07, TICS , no polyp; next 2011; patient aware   tobacco--quit 2010, praised! encoraged a healthy life style  check a PSA   Problem # 3:  OSTEOPENIA (ICD-733.90) last bone density test on December 2008-- had one recently, results pending  4-10  Vit D normal took alendronate up until 11-10    Problem # 4:  HYPERTENSION (ICD-401.9)  no change  His updated medication list for this problem includes:    Hydrochlorothiazide 25 Mg Tabs (Hydrochlorothiazide) .Marland Kitchen... 1 by mouth once daily    Micardis 80 Mg Tabs (Telmisartan) .Marland Kitchen... 2 p.o. daily  BP today: 140/72 Prior BP: 158/88 (03/21/2010)  Labs Reviewed: K+: 4.7 (10/04/2009) Creat: : 0.9 (10/04/2009)   Chol: 202 (10/04/2009)   HDL: 33.90 (10/04/2009)   LDL: 122 (02/04/2008)   TG: 145.0 (10/04/2009)  Orders: Venipuncture ([address]) TLB-TSH (Thyroid Stimulating Hormone) (84443-TSH) TLB-BMP (Basic Metabolic Panel-BMET) (80048-METABOL)  Problem # 5:  GERD (ICD-530.81) status post a Nissan fundoplication 1/11, symptoms   well controlled except for hicups sporadically, off PPI  Complete Medication List: 1)  Hydrochlorothiazide 25 Mg Tabs (Hydrochlorothiazide) .Marland Kitchen.. 1 by mouth once daily 2)  Micardis 80 Mg Tabs (Telmisartan) .... 2 p.o. daily 3)  Celebrex 200 Mg Caps (Celecoxib) .Marland Kitchen.. 1 by mouth bid 4)  Calcium--vit D--mvi  5)  Mangosteen  .Marland Kitchen.. 1 by mouth once daily prn 6)  Multivitamins Tabs (Multiple vitamin) .Marland Kitchen.. 1 by mouth once daily 7)  Tylenol Extra Strength 500 Mg Tabs (Acetaminophen) .... Take as needed 8)  Folic Acid   Other Orders: TLB-PSA (Prostate Specific Antigen) (84153-PSA)  Patient Instructions: 1)  Please schedule a follow-up appointment in 6 months .

## 2011-01-24 NOTE — Medication Information (Signed)
Summary: Patient Assistance Form/Reclast  Patient Assistance Form/Reclast   Imported By: Lanelle Bal 07/06/2010 08:27:50  _____________________________________________________________________  External Attachment:    Type:   Image     Comment:   External Document

## 2011-01-24 NOTE — Letter (Signed)
Summary: doing well after Nissan Fundaplic.  [facility]   Imported By: Lanelle Bal 05/01/2010 12:14:56  _____________________________________________________________________  External Attachment:    Type:   Image     Comment:   External Document

## 2011-01-24 NOTE — Medication Information (Signed)
Summary: Drug Interaction Notification  Drug Interaction Notification   Imported By: Maryln Gottron 01/16/2011 14:40:04  _____________________________________________________________________  External Attachment:    Type:   Image     Comment:   External Document

## 2011-01-24 NOTE — Letter (Signed)
Summary: Rx back surgery----[facility]  [facility]   Imported By: Lanelle Bal 09/03/2010 14:24:06  _____________________________________________________________________  External Attachment:    Type:   Image     Comment:   External Document

## 2011-01-24 NOTE — Letter (Signed)
Summary: Colonoscopy Letter  [facility]  [address] [place], [place] [address]   Phone: [phone]  Fax: [phone]      November 01, 2010 MRN: [id number]   DOC MANDALA [address] [place], [place]  [address]   Dear Mr. Mckendree,   According to your medical record, it is time for you to schedule a Colonoscopy. The American Cancer Society recommends this procedure as a method to detect early colon cancer. Patients with a family history of colon cancer, or a personal history of colon polyps or inflammatory bowel disease are at increased risk.  This letter has been generated based on the recommendations made at the time of your procedure. If you feel that in your particular situation this may no longer apply, please contact our office.  Please call our office at [phone] to schedule this appointment or to update your records at your earliest convenience.  Thank you for cooperating with [place] to provide you with the very best care possible.   Sincerely,  Wilhemina Bonito. Marina Goodell, M.D.  [facility] [phone]

## 2011-01-24 NOTE — Progress Notes (Signed)
Summary: fyi - reclast not approved, will try again in July  Medicare denied reclast for dx of osteopenia.  However, I was told today by drug rep that after june Medicare will be changing providers.  They should start covering the dx.  Advised pt that we would try again in July so that he will not have to pay as much out of pocket. Shary Decamp  Apr 23, 2010 11:15 AM

## 2011-01-24 NOTE — Progress Notes (Signed)
Summary: FYI- declined  reclast  Phone Note Outgoing Call   Summary of Call: I called pt and spoke with him regarding his Reclast, and he states he does not want to go on with the Reclast. He is being seeing by [facility] and they  are montioring his osteopenia. He has some bulging discs in his back. He states they are treating everything.  Army Fossa CMA  July 30, 2010 9:39 AM   Follow-up for Phone Call        noted  Follow-up by: [facility] E. Paz MD,  July 30, 2010 1:59 PM

## 2011-01-24 NOTE — Consult Note (Signed)
Summary: [facility]  [facility]   Imported By: Lanelle Bal 10/31/2010 09:23:09  _____________________________________________________________________  External Attachment:    Type:   Image     Comment:   External Document

## 2011-01-24 NOTE — Assessment & Plan Note (Signed)
Summary: possible pneumonia//lch   Vital Signs:  Patient profile:   70 year old male Weight:      190.2 pounds BMI:     30.35 O2 Sat:      95 % Temp:     98.3 degrees F oral Pulse rate:   76 / minute Resp:     16 per minute BP sitting:   158 / 88  (left arm) Cuff size:   large  Vitals Entered By: Shonna Chock (March 21, 2010 1:25 PM) CC: Possible Pneumonia, shortness of breath Comments REVIEWED MED LIST, PATIENT AGREED DOSE AND INSTRUCTION CORRECT  Pulse O2% started at 95%, and while walking to the lab it increased to 96% and remained there.   Primary Care Provider:  Marga Melnick, MD  CC:  Possible Pneumonia and shortness of breath.  History of Present Illness: Dyspnea , ? antedated  surgery for  "a leak in esophagus"  & HH 01/19/2010 by Dr Daphine Deutscher.Op report reviewed : Nissen fundoplication. Dyspnea no worse since surgery but he  "breathes shorter" according to wife. PMH of Sleep Apnea.  PMH of PNA X 11 , last  in Fall of 2010. There is no history of COPD, asthma, ASCVD, CHF, valvular heart disease, arrhythmias, renal failure, or  thyroid disease.There is PMH of "chronic organized PNA" as per PMH in EMR.  Patient notes dyspnea with ADL's, walking on a flat surface after 150 feet, and walking up one flight of stairs.    Allergies: 1)  ! * Calcium Channel Blockers 2)  * Penicillins Group 3)  * Vioxx (Rofecoxib) 4)  Felodipine (Felodipine)  Past History:  Past Medical History: GERD/ HYPERTENSION  RESTLESS LEG SYNDROME  BACK PAIN  SPONDYLOSIS, CERVICAL SLEEP APNEA, CHRONIC ,CPAP intolerant DECREASED HEARING  COLONOSCOPY: last 11/2006, TICS , no polyp; next 2011/HYPERPLASTIC 2005 Hx of PNEUMONIA, RECURRENT , bronchoscopy 2005: Chronic organized pneumonia, Dr Delford Field Dx w/ [facility] DISEASE (2002 Dr Barb Merino resolved Depression Osteopenia  Past Surgical History: Tonsillectomy uvuloplasty 2004 Foot surgery 2006 Nissen Fundoplication 12/2009  Review of  Systems General:  Complains of chills and sweats; denies fever and weight loss; Weight up 12 #. ENT:  No frontal headache , facial pain or purulence. CV:  Complains of swelling of hands; denies bluish discoloration of lips or nails, difficulty breathing at night, difficulty breathing while lying down, and swelling of feet. Resp:  Denies coughing up blood and sputum productive; Occasional pleuritic chest pain.  Physical Exam  General:  in no acute distress; alert,appropriate and cooperative throughout examination Ears:  External ear exam shows no significant lesions or deformities.  Otoscopic examination reveals clear canals, tympanic membranes are intact bilaterally without bulging, retraction, inflammation or discharge. Hearing is grossly  decreased("I don't wear hearing aids in the rain") Nose:  External nasal examination shows no deformity or inflammation. Nasal mucosa are pink and moist without lesions or exudates. septal dislocation Mouth:  Oral mucosa and oropharynx without lesions or exudates.  Teeth in good repair.S/P uvulectomy Lungs:  Normal respiratory effort, chest expands symmetrically. Lungs are clear to auscultation, no crackles or wheezes. O2 sats 95 @ rest, rose to 96% with ambulation Heart:  normal rate, regular rhythm, no murmur, no gallop, no rub, no JVD, and no HJR.   Abdomen:  Bowel sounds positive,abdomen soft and non-tender without masses, organomegaly or hernias noted. Multiple lap op scars Pulses:  R and L carotid,radial,dorsalis pedis and posterior tibial pulses are full and equal bilaterally Extremities:  No clubbing, cyanosis, edema. Neg  Homan's  Neurologic:  alert ; history disjointed (? affected by hearing compromise) Skin:  Intact without suspicious lesions or rashes Cervical Nodes:  No lymphadenopathy noted Axillary Nodes:  No palpable lymphadenopathy Psych:  flat affect and subdued.     Impression & Recommendations:  Problem # 1:  DYSPNEA/SHORTNESS OF  BREATH (ICD-786.09)  His updated medication list for this problem includes:    Hydrochlorothiazide 25 Mg Tabs (Hydrochlorothiazide) .Marland Kitchen... 1 by mouth once daily  Orders: Venipuncture (09811) TLB-CBC Platelet - w/Differential (85025-CBCD) T-2 View CXR (71020TC)  Problem # 2:  GERD (ICD-530.81) PMH of HH, S/P Nissen Fundoplication01/2011; ? exacerbation with 12+ # weight gain The following medications were removed from the medication list:    Protonix 40 Mg Tbec (Pantoprazole sodium) .Marland Kitchen... 1 tablet by mouth two times a day  Orders: Venipuncture (91478) TLB-CBC Platelet - w/Differential (85025-CBCD)  Problem # 3:  HIATAL HERNIA (ICD-553.3)  The following medications were removed from the medication list:    Protonix 40 Mg Tbec (Pantoprazole sodium) .Marland Kitchen... 1 tablet by mouth two times a day  Orders: T-2 View CXR (71020TC)  Complete Medication List: 1)  Hydrochlorothiazide 25 Mg Tabs (Hydrochlorothiazide) .Marland Kitchen.. 1 by mouth once daily 2)  Micardis 80 Mg Tabs (Telmisartan) .... 2 p.o. daily 3)  Celebrex 200 Mg Caps (Celecoxib) .Marland Kitchen.. 1 by mouth bid 4)  Calcium--vit D--mvi  5)  Pataday 0.2 % Soln (Olopatadine hcl) .Marland Kitchen.. 1 gtt both eyes as needed 6)  Mangosteen  .Marland Kitchen.. 1 by mouth once daily prn 7)  Multivitamins Tabs (Multiple vitamin) .Marland Kitchen.. 1 by mouth once daily 8)  Tylenol Extra Strength 500 Mg Tabs (Acetaminophen) .... Take as needed  Patient Instructions: 1)  Lung function tests  may be necessary if films are negative & symptoms progress

## 2011-02-12 ENCOUNTER — Ambulatory Visit (INDEPENDENT_AMBULATORY_CARE_PROVIDER_SITE_OTHER): Payer: Medicare Other | Admitting: Internal Medicine

## 2011-02-12 ENCOUNTER — Other Ambulatory Visit: Payer: Self-pay | Admitting: Internal Medicine

## 2011-02-12 ENCOUNTER — Encounter: Payer: Self-pay | Admitting: Internal Medicine

## 2011-02-12 DIAGNOSIS — M549 Dorsalgia, unspecified: Secondary | ICD-10-CM

## 2011-02-12 DIAGNOSIS — I10 Essential (primary) hypertension: Secondary | ICD-10-CM

## 2011-02-12 LAB — BASIC METABOLIC PANEL
BUN: 17 mg/dL (ref 6–23)
Calcium: 10.1 mg/dL (ref 8.4–10.5)
GFR: 84.39 mL/min (ref >60.00)
Potassium: 5.2 mEq/L — ABNORMAL HIGH (ref 3.5–5.1)
Sodium: 141 mEq/L (ref 135–145)

## 2011-02-12 LAB — CBC WITH DIFFERENTIAL/PLATELET
Eosinophils Relative: 4.6 % (ref 0.0–5.0)
HCT: 44.4 % (ref 39.0–52.0)
Hemoglobin: 15.3 g/dL (ref 13.0–17.0)
Lymphocytes Relative: 20.9 % (ref 12.0–46.0)
Lymphs Abs: 1.7 10*3/uL (ref 0.7–4.0)
Monocytes Relative: 7.8 % (ref 3.0–12.0)
Platelets: 230 10*3/uL (ref 150.0–400.0)
WBC: 8.3 10*3/uL (ref 4.5–10.5)

## 2011-02-19 ENCOUNTER — Encounter: Payer: Self-pay | Admitting: Internal Medicine

## 2011-02-19 NOTE — Assessment & Plan Note (Signed)
Summary: 4-5 MONTH FU/SPH   Vital Signs:  Patient profile:   70 year old male Height:      66.5 inches Weight:      193.50 pounds BMI:     30.88 Pulse rate:   79 / minute Pulse rhythm:   regular BP sitting:   124 / 82  (left arm) Cuff size:   large  Vitals Entered By: Army Fossa CMA (February 12, 2011 7:58 AM) CC: 5 month f/u- fasting  Comments express scripts    History of Present Illness: ROV  HYPERTENSION -good medication compliance   BP readings varies through the day: 170s in AM, 120s in the afternoon, sometimes as low as 100 at 6 pm, DBP usually ok at 70. when SBP  ~100, sometimes feels wek but not consistently so.   Current Medications (verified): 1)  Hydrochlorothiazide 25 Mg  Tabs (Hydrochlorothiazide) .Marland Kitchen.. 1 By Mouth Once Daily 2)  Micardis 80 Mg  Tabs (Telmisartan) .... 2 P.o. Daily 3)  Celebrex 200 Mg Caps (Celecoxib) .Marland Kitchen.. 1 By Mouth Bid 4)  Mangosteen .Marland Kitchen.. 1 By Mouth Once Daily Prn 5)  Multivitamins  Tabs (Multiple Vitamin) .Marland Kitchen.. 1 By Mouth Once Daily 6)  Pataday 0.2 % Soln (Olopatadine Hcl) .Marland Kitchen.. 1 Drop Daily. 7)  Valtrex 1 Gm Tabs (Valacyclovir Hcl) .... 2 Tablets Twice A Day For One Day With The Onset of Fever Blisters  Allergies (verified): 1)  ! * Calcium Channel Blockers 2)  * Penicillins Group 3)  * Vioxx (Rofecoxib) 4)  Felodipine (Felodipine)  Past History:  Past Medical History: Reviewed history from 10/08/2010 and no changes required. GERD  HYPERTENSION  RESTLESS LEG SYNDROME  BACK PAIN  SPONDYLOSIS, CERVICAL SLEEP APNEA, CHRONIC ,CPAP intolerant DECREASED HEARING  COLONOSCOPY: last 11/2006, TICS , no polyp; next 2011/HYPERPLASTIC 2005 Hx of PNEUMONIA, RECURRENT , bronchoscopy 2005: Chronic organized pneumonia, Dr Delford Field Dx w/ [facility] DISEASE (2002 Dr Barb Merino resolved Depression Osteopenia  Past Surgical History: Reviewed history from 10/08/2010 and no changes required. Tonsillectomy uvuloplasty 2004 Foot surgery  2006 Nissen Fundoplication 12/2009 back fusion L 3-4-5 09-17-10 DR Wynetta Emery   Review of Systems CV:  Denies chest pain or discomfort, shortness of breath with exertion, and swelling of feet. GI:  Denies diarrhea, nausea, and vomiting. MS:  shoulder paoin R, thumb pain L--- to be seen by ortho.  Physical Exam  General:  alert and well-developed.   Lungs:  normal respiratory effort, no intercostal retractions, no accessory muscle use, and normal breath sounds.   Heart:  normal rate, regular rhythm, no murmur, and no gallop.   Psych:  Oriented X3, memory intact for recent and remote, normally interactive, good eye contact, not anxious appearing, and not depressed appearing.     Impression & Recommendations:  Problem # 1:  HYPERTENSION (ICD-401.9) has been on micardis two times a day x long time, BP lowest in the afternoon before his 2nd micardis. Change timing to: 1/2  micardis AM 1.5 micardis PM  we are checking as BMP, last creat. was slightly  high, consider decrease ARB dose  His updated medication list for this problem includes:    Hydrochlorothiazide 25 Mg Tabs (Hydrochlorothiazide) .Marland Kitchen... 1 by mouth once daily    Micardis 80 Mg Tabs (Telmisartan) .Marland Kitchen... 1/2  tab in am and 1.5 tab in the afternoon  Orders: Venipuncture (98119) TLB-BMP (Basic Metabolic Panel-BMET) (80048-METABOL) TLB-CBC Platelet - w/Differential (85025-CBCD) Specimen Handling (14782)  Problem # 2:  BACK PAIN (ICD-724.5) s/p surgery last year, still recovering  The following medications were removed from the medication list:    Oxycodone-acetaminophen 5-325 Mg Tabs (Oxycodone-acetaminophen) .Marland Kitchen... 1-2 tabs x 4-6 hrs as needed    Flexeril 10 Mg Tabs (Cyclobenzaprine hcl) .Marland Kitchen... 1 by mouth daily. His updated medication list for this problem includes:    Celebrex 200 Mg Caps (Celecoxib) .Marland Kitchen... 1 by mouth bid  Problem # 3:  HEALTH SCREENING (ICD-V70.0) due for a Cscope, still recovering from surgery in ther back ,  has put Cscope on hold   Complete Medication List: 1)  Hydrochlorothiazide 25 Mg Tabs (Hydrochlorothiazide) .Marland Kitchen.. 1 by mouth once daily 2)  Micardis 80 Mg Tabs (Telmisartan) .... 1/2  tab in am and 1.5 tab in the afternoon 3)  Celebrex 200 Mg Caps (Celecoxib) .Marland Kitchen.. 1 by mouth bid 4)  Mangosteen  .Marland Kitchen.. 1 by mouth once daily prn 5)  Multivitamins Tabs (Multiple vitamin) .Marland Kitchen.. 1 by mouth once daily 6)  Pataday 0.2 % Soln (Olopatadine hcl) .Marland Kitchen.. 1 drop daily. 7)  Valtrex 1 Gm Tabs (Valacyclovir hcl) .... 2 tablets twice a day for one day with the onset of fever blisters  Patient Instructions: 1)  Please schedule a follow-up appointment in 3 months . Fasting , physical exam .    Orders Added: 1)  Venipuncture [36415] 2)  TLB-BMP (Basic Metabolic Panel-BMET) [80048-METABOL] 3)  TLB-CBC Platelet - w/Differential [85025-CBCD] 4)  Specimen Handling [99000] 5)  Est. Patient Level III [38756]

## 2011-03-05 NOTE — Letter (Signed)
Summary: [facility]  [facility]   Imported By: Maryln Gottron 02/22/2011 10:49:48  _____________________________________________________________________  External Attachment:    Type:   Image     Comment:   External Document

## 2011-03-07 LAB — CBC
MCV: 92 fL (ref 78.0–100.0)
Platelets: 246 10*3/uL (ref 150–400)
RBC: 4.77 MIL/uL (ref 4.22–5.81)
RDW: 12.8 % (ref 11.5–15.5)
WBC: 8.7 10*3/uL (ref 4.0–10.5)

## 2011-03-07 LAB — ABO/RH: ABO/RH(D): O POS

## 2011-03-07 LAB — BASIC METABOLIC PANEL
BUN: 12 mg/dL (ref 6–23)
Calcium: 10.4 mg/dL (ref 8.4–10.5)
Chloride: 105 mEq/L (ref 96–112)
Creatinine, Ser: 0.93 mg/dL (ref 0.4–1.5)
GFR calc Af Amer: >60 mL/min (ref >60)
GFR calc non Af Amer: >60 mL/min (ref >60)

## 2011-03-07 LAB — SURGICAL PCR SCREEN
MRSA, PCR: NEGATIVE
Staphylococcus aureus: NEGATIVE

## 2011-03-07 LAB — TYPE AND SCREEN: ABO/RH(D): O POS

## 2011-03-10 LAB — DIFFERENTIAL
Eosinophils Absolute: 0 10*3/uL (ref 0.0–0.7)
Eosinophils Relative: 0 % (ref 0–5)
Lymphocytes Relative: 9 % — ABNORMAL LOW (ref 12–46)
Lymphs Abs: 1.4 10*3/uL (ref 0.7–4.0)
Monocytes Absolute: 1.2 10*3/uL — ABNORMAL HIGH (ref 0.1–1.0)

## 2011-03-10 LAB — BASIC METABOLIC PANEL
BUN: 15 mg/dL (ref 6–23)
Chloride: 98 mEq/L (ref 96–112)
GFR calc non Af Amer: >60 mL/min (ref >60)
Glucose, Bld: 127 mg/dL — ABNORMAL HIGH (ref 70–99)
Potassium: 4.4 mEq/L (ref 3.5–5.1)
Sodium: 138 mEq/L (ref 135–145)

## 2011-03-10 LAB — CBC
HCT: 38.2 % — ABNORMAL LOW (ref 39.0–52.0)
Hemoglobin: 12.8 g/dL — ABNORMAL LOW (ref 13.0–17.0)
MCV: 91.6 fL (ref 78.0–100.0)
WBC: 15.3 10*3/uL — ABNORMAL HIGH (ref 4.0–10.5)

## 2011-03-13 LAB — CBC
HCT: 39.1 % (ref 39.0–52.0)
MCHC: 34.2 g/dL (ref 30.0–36.0)
MCV: 91.7 fL (ref 78.0–100.0)
Platelets: 249 10*3/uL (ref 150–400)
RDW: 12.5 % (ref 11.5–15.5)

## 2011-03-13 LAB — DIFFERENTIAL
Basophils Absolute: 0 10*3/uL (ref 0.0–0.1)
Basophils Relative: 0 % (ref 0–1)
Eosinophils Absolute: 0.4 10*3/uL (ref 0.0–0.7)
Eosinophils Relative: 6 % — ABNORMAL HIGH (ref 0–5)
Neutrophils Relative %: 56 % (ref 43–77)

## 2011-03-28 ENCOUNTER — Other Ambulatory Visit: Payer: Self-pay | Admitting: Neurosurgery

## 2011-03-28 ENCOUNTER — Ambulatory Visit
Admission: RE | Admit: 2011-03-28 | Discharge: 2011-03-28 | Disposition: A | Discharge status: Home / Self Care | Payer: Medicare Other | Source: Ambulatory Visit | Attending: Neurosurgery | Admitting: Neurosurgery

## 2011-03-28 DIAGNOSIS — M542 Cervicalgia: Secondary | ICD-10-CM

## 2011-03-28 DIAGNOSIS — Z09 Encounter for follow-up examination after completed treatment for conditions other than malignant neoplasm: Secondary | ICD-10-CM

## 2011-03-28 DIAGNOSIS — M25511 Pain in right shoulder: Secondary | ICD-10-CM

## 2011-03-30 ENCOUNTER — Ambulatory Visit
Admission: RE | Admit: 2011-03-30 | Discharge: 2011-03-30 | Disposition: A | Discharge status: Home / Self Care | Payer: Medicare Other | Source: Ambulatory Visit | Attending: Neurosurgery | Admitting: Neurosurgery

## 2011-03-30 DIAGNOSIS — M25511 Pain in right shoulder: Secondary | ICD-10-CM

## 2011-03-30 DIAGNOSIS — M542 Cervicalgia: Secondary | ICD-10-CM

## 2011-04-29 ENCOUNTER — Ambulatory Visit (INDEPENDENT_AMBULATORY_CARE_PROVIDER_SITE_OTHER): Payer: Medicare Other | Admitting: Internal Medicine

## 2011-04-29 ENCOUNTER — Encounter: Payer: Self-pay | Admitting: Internal Medicine

## 2011-04-29 VITALS — BP 144/86 | HR 76 | Wt 196.2 lb

## 2011-04-29 DIAGNOSIS — G459 Transient cerebral ischemic attack, unspecified: Secondary | ICD-10-CM

## 2011-04-29 NOTE — Progress Notes (Signed)
  Subjective:    Patient ID: [name], male    DOB: 16-Jul-1941, 70 y.o.   MRN: [id number]  HPI TIA? 2 episodes of generalized left-sided numbness that lasted for a few minutes, one episode on April 25 and the second episode of April 29. The first episode lasted about 10 minutes and the second episode lasted 3 minutes. During the second episode, he stumble , wife had to help him to prevent a fall. Both symptoms happening in the context of severe neck pain, he has seen neurosurgery and subsequently pain management. He has constant bilateral neck pain with  "pins and needles" in the left arm. He is scheduled to have a local injection in the neck in 3 days, he is holding his regular aspirin for that reasom. Reports a recent Cervical MRI in preparation to local injection .  Past Medical History  Diagnosis Date  . GERD (gastroesophageal reflux disease)   . Hypertension   . RLS (restless legs syndrome)   . Back pain   . Spondylosis, cervical   . Sleep apnea     chronic, CPAP intolerant  . Decreased hearing   . Pneumonia     recurrent, bronchoscopy 2005: chronic organized pneumonia, Dr. Delford Field   . Alzheimer disease 2002    Dr.Ferrarou- sx resolved   . Depression   . Osteopenia     Past Surgical History  Procedure Date  . Tonsillectomy   . Uvoloplasty 2004  . Foot surgery 2006  . Nissen fundoplication 12/2009  . Back fusion 09/17/10    L 3-4-5, Dr.Cram    Review of Systems Denies any associated nausea, diplopia, slurred speech. No phase paralysis or motor deficits. No dizziness. He is ambulatory BP readings are within normal. No chest pain or shortness of breath     Objective:   Physical Exam Alert, oriented x3. Neck, normal carotid pulses without bruit. Lungs, CTA bilaterally Cardiovascular, regular rate and rhythm, no murmur. Extremities, no edema. Neurological exam: Cranial nerves intact, motor symmetric, DTRs symmetric. Gait is normal, speech is fluent, he is  oriented x3.       Assessment & Plan:

## 2011-04-29 NOTE — Assessment & Plan Note (Addendum)
TIA?? Episodic left-sided numbness in the setting of severe neck pain with left-sided radiculopathy. Neurological exam is negative, no red flag symptoms such as slurred speech or dizziness. As far as his cardiovascular risk factors, he quit tobacco in 2009, last LDL was 143  Few months ago. His blood pressure is well controlled. I recommended to restart aspirin but the patient is somewhat reluctant because he is looking forward to have his cervical injection due to the severe pain. With that in mind, we will schedule a brain MRI as soon as possible, also a carotid ultrasound, echocardiogram. If all of above normal, will restart aspirin as soon as possible, and consider neurological referral although I think sx can be explained by neck issues .

## 2011-04-30 ENCOUNTER — Ambulatory Visit
Admission: RE | Admit: 2011-04-30 | Discharge: 2011-04-30 | Disposition: A | Discharge status: Home / Self Care | Payer: Medicare Other | Source: Ambulatory Visit | Attending: Internal Medicine | Admitting: Internal Medicine

## 2011-04-30 ENCOUNTER — Telehealth: Payer: Self-pay | Admitting: *Deleted

## 2011-04-30 DIAGNOSIS — G459 Transient cerebral ischemic attack, unspecified: Secondary | ICD-10-CM

## 2011-04-30 MED ORDER — GADOBENATE DIMEGLUMINE 529 MG/ML IV SOLN
18.0000 mL | Freq: Once | INTRAVENOUS | Status: AC | PRN
  Administered 2011-04-30: 18 mL via INTRAVENOUS

## 2011-04-30 NOTE — Progress Notes (Signed)
Addended by: Doristine Devoid on: 04/30/2011 10:55 AM   Modules accepted: Orders

## 2011-04-30 NOTE — Telephone Encounter (Signed)
Message copied by Army Fossa on Tue Apr 30, 2011 10:23 AM ------      Message from: Donzetta Kohut      Created: Tue Apr 30, 2011 10:14 AM      Regarding: ORDERS TO BE ENTERED       DR. PAZ SENT ME BELOW......  PLEASE ENTER THE ORDERS HE IS REQUESTING.  I HAVEN'T ENTERED ORDERS, SO I DON'T KNOW HOW.            ----- Message -----         From: Wanda Plump, MD         Sent: 04/30/2011   9:43 AM           To: Donzetta Kohut            Please enter at echocardiogram to be done at a [facility] cardiology. I couldn't do it. Also need a brain MRI and a carotid ultrasound at [facility]

## 2011-04-30 NOTE — Telephone Encounter (Signed)
Dr.Paz need diagnosis for all orders below.

## 2011-04-30 NOTE — Telephone Encounter (Signed)
Dx TIA or L sided numbness/paresthesia

## 2011-05-03 ENCOUNTER — Other Ambulatory Visit (HOSPITAL_COMMUNITY): Payer: Medicare Other | Admitting: Radiology

## 2011-05-03 ENCOUNTER — Ambulatory Visit (HOSPITAL_COMMUNITY): Payer: Medicare Other | Attending: Internal Medicine | Admitting: Radiology

## 2011-05-03 DIAGNOSIS — G459 Transient cerebral ischemic attack, unspecified: Secondary | ICD-10-CM

## 2011-05-03 DIAGNOSIS — R0989 Other specified symptoms and signs involving the circulatory and respiratory systems: Secondary | ICD-10-CM

## 2011-05-07 ENCOUNTER — Telehealth: Payer: Self-pay | Admitting: *Deleted

## 2011-05-07 DIAGNOSIS — G459 Transient cerebral ischemic attack, unspecified: Secondary | ICD-10-CM

## 2011-05-07 NOTE — Assessment & Plan Note (Signed)
[name]                         GASTROENTEROLOGY OFFICE NOTE   [name], [name]                       MRN:          [id number]  DATE:07/17/2007                            DOB:          1941-12-09    REASON FOR EVALUATION:  Reflux and dysphagia.   HISTORY:  This is a 70 year old white male with a history of  hypertension, sleep apnea, arthritis, gastroesophageal reflux disease,  and adenomatous colon polyps.  He has had recurrent problems with  pneumonia felt likely due to aspiration.  Patient had been on Prevacid  chronically for his reflux.  Recently had been experiencing some  problems with breakthrough symptoms, regurgitation, particularly at  night, and intermittent solid food dysphagia.  He was tried on Protonix,  and subsequently Zegerid.  Currently on Zegerid 40 mg daily, symptoms  have improved.  He still has some intermittent dysphagia.  He sleeps on  pillows, but does not have his bed elevated.  He has had slight decrease  in appetite, and about a 4-pound weight loss over the past 4 weeks.  No  other active symptoms.  He is up to date in terms of his surveillance  colonoscopy.   PAST MEDICAL HISTORY:  As above.   PAST SURGICAL HISTORY:  Bunionectomy.   ALLERGIES:  1. TRAZODONE.  2. PENICILLIN.  3. VIOXX.   CURRENT MEDICATIONS:  1. Aspirin 325 mg daily.  2. Multivitamin.  3. Mirapex 0.5 two at night.  4. Celebrex 200 mg b.i.d.  5. Metoprolol 100 mg b.i.d.  6. Nasonex b.i.d.  7. Zegerid 40 mg daily.  8. Glucosamine sulfate.  9. Micardis 80 mg b.i.d.  10.He also takes Advil p.r.n., Restoril p.r.n., Provigil p.r.n., and      eye drops p.r.n.   FAMILY HISTORY:  Per diagnostic evaluation form.   SOCIAL HISTORY:  Per diagnostic evaluation form.   REVIEW OF SYSTEMS:  Per diagnostic evaluation form.   PHYSICAL EXAMINATION:  A well-appearing male in no acute distress.  Blood pressure is 140/72.  Heart rate is 48 and  regular.  Weight is 175  pounds.  He is 5 feet 7 inches in height.  HEENT:  Sclerae anicteric.  Conjunctivae are pink.  Oral mucosa is  intact.  No adenopathy.  LUNGS:  Clear.  HEART:  Regular.  ABDOMEN:  Soft without tenderness, mass, or hernia.  No succussion  splash.  Good bowel sounds heard.  EXTREMITIES:  Without edema.   IMPRESSION:  1. Gastroesophageal reflux disease.  The patient was experiencing      breakthrough symptoms on Prevacid.  He seems to be doing better on      Zegerid.  2. Intermittent solid food dysphagia.  Rule out peptic stricture.   RECOMMENDATIONS:  1. Continue Zegerid.  2. Reflux precautions with attention to elevating the head of the bed      at night and avoiding late night meals.  3. Schedule upper endoscopy with possible esophageal dilation.  The      nature of the procedure, as well as the risks, benefits, and      alternatives have been  reviewed.  He understood and agreed to      proceed.     Wilhemina Bonito. Marina Goodell, MD  Electronically Signed    JNP/MedQ  DD: 07/17/2007  DT: 07/18/2007  Job #: [id number]   cc:   Joni Fears D. Maple Willmon, MD, FCCP, FACP  Willow Ora, MD

## 2011-05-07 NOTE — Telephone Encounter (Signed)
Pt was scheduled for an Stress ECHO had stress ECHO done, they are now questioning if the Stress echo was the correct test to be done. Please advise.

## 2011-05-07 NOTE — Assessment & Plan Note (Signed)
[facility]                             PULMONARY OFFICE NOTE   DEVINE, KLINGEL                       MRN:          [id number]  DATE:07/28/2007                            DOB:          December 18, 1941    PROBLEMS:  1. Obstructive sleep apnea with hypersomnia.  2. Recurrent pneumonia/aspiration.  3. Restless legs syndrome.  4. Esophageal reflux (Dr. Marina Goodell).   HISTORY:  He failed completely to tolerate CPAP and turned it back in  because he says that he was taken it off every night. He says that he  feels much better rested without it. Now pending endoscopy for reflux.  Dr. Drue Novel has given Provigil which helps with daytime alertness. I  discussed Provigil as a treatment of symptom, but not of underlying  medical problems with sleep apnea. He understands this. He has contact  information to explore an oral appliance, but has not followed through  on it. I encouraged him to do so. He does keep his weight down and  understands to sleep off the flat of his back. 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   MEDICATIONS:  1. Celebrex 200 mg.  2. Metoprolol 100 mg.  3. Zegerid 40 mg.  4. Mirapex 0.5 mg.  5. Hydrochlorothiazide 25 mg.  6. Glucosamine.  7. Aspirin 325 mg.  8. Multivitamin.  9. Micardis 80 mg.  10.Nasonex.  11.Provigil p.r.n. usually 1/2 of a 200 mg tablet p.r.n.  12.Temazepam 15 mg p.r.n. at night.  13.Patiday eye drops.  14.Advil.   Drug intolerant TRAZODONE, PENICILLIN, VIOXX.   OBJECTIVE:  Weight 171 pounds, blood pressure 116/74, pulse 52, room air  saturation 97%. He is trim and alert with minor crackles in the lung  bases. Work of breathing is not increased. There is no cough. No  adenopathy. No neck vein distension. Heart sounds are unremarkable.   IMPRESSION:  1. Obstructive sleep apnea after palatoplasty, intolerant of CPAP.  2. Recurrent pneumonia  strongly suspicious aspiration events.  3. Esophageal reflux.   PLAN:  We agreed that I would see him again in a year, earlier p.r.n. I  did encourage him to learn more about oral appliances for treatment of  sleep apnea. We can repeat his sleep study in the future when needed,  but note that he scored 61.7 events per hour on 05/31/2007.     Clinton D. Maple Davie, MD, Tonny Bollman, FACP  Electronically Signed    CDY/MedQ  DD: 08/02/2007  DT: 08/03/2007  Job #: [id number]   cc:   Willow Ora, MD

## 2011-05-07 NOTE — Procedures (Signed)
NAME:  [name], [name] NO.:  [id number]   MEDICAL RECORD NO.:  [id number]          PATIENT TYPE:  OUT   LOCATION:  SLEEP CENTER                 FACILITY[facility]  [facility]   PHYSICIAN:  Clinton D. Maple Goulette, MD, FCCP, FACPDATE OF BIRTH:  10-17-41   DATE OF STUDY:  05/31/2007                            NOCTURNAL POLYSOMNOGRAM   REFERRING PHYSICIAN:  Clinton D. Young, MD, FCCP, FACP   INDICATION FOR STUDY:  Hypersomnia with sleep apnea.  The patient has a  history of obstructive sleep apnea with inability to tolerate CPAP tried  in the past and is being re-evaluated.  Also, a history of restless leg  syndrome and recurrent pneumonias.   EPWORTH SLEEPINESS SCORE:  12/24, BMI 27.2, weight 174 pounds.   MEDICATIONS:  Home medications are listed and reviewed.   SLEEP ARCHITECTURE:  Total sleep time 276 minutes with sleep deficiency  79%.  Stage one was 6%, stage two 94%.  Stages 3, 4 and REM were absent.  Sleep latency 2 minutes, awake after sleep onset 71 minutes, arousal  index markedly increased at 72.6.  No bedtime medication was taken.   RESPIRATORY DATA:  Apnea/hypopnea index (AHI, RDI) 61.7 obstructive  events per hour indicating severe obstructive sleep apnea/hypopnea  syndrome.  There were 14 central apneas, 175 obstructive apneas, 5 mixed  apneas and 90 hypopneas.  Most sleep and therefore most events initially  were supine.  The technician worked with a number of different mask  styles and with both CPAP and BIPAP but could not get the patient to  tolerate any of these sufficiently to permit CPAP titration by split  protocol.   OXYGEN DATA:  Moderately loud snoring with oxygen desaturation to a  nadir of 85%.  Mean oxygen saturation through the study was 93% on room  air.   CARDIAC DATA:  Normal sinus rhythm.   MOVEMENT-PARASOMNIA:  No significant limb movement or activity  disturbance.  Bathroom x1.   IMPRESSIONS-RECOMMENDATIONS:  1. Short total sleep time  absent stages 3, 4 and REM, with increased      sleep fragmentation/EEG arousals.  2. Severe obstructive sleep apnea/hypopnea syndrome, AHI 61.7 per hour      with non-positional events, more frequent while supine, but most      sleep also while supine.  Moderately loud snoring with oxygen      desaturation to a nadir of 85%.  3. Patient was unable to tolerate any of multiple CPAP masks, CPAP or      BIPAP modalities tried.  He seemed to      prefer a SNAPP med, CPAP mask, and the technician suggests use of a      sleeping pill.  He will need formal desensitization, unless other      therapies are more appropriate.  This will be determined in office      followup.      Clinton D. Maple Knutzen, MD, FCCP, FACP  Diplomate, Biomedical engineer of Sleep Medicine  Electronically Signed     CDY/MEDQ  D:  06/07/2007 11:06:17  T:  06/07/2007 19:41:22  Job:  [id number]

## 2011-05-07 NOTE — Assessment & Plan Note (Signed)
[facility]                             PULMONARY OFFICE NOTE   [name], [name]                       MRN:          [id number]  DATE:06/18/2007                            DOB:          05/09/1941    PROBLEM:  1. Obstructive sleep apnea with hypersomnia.  2. Recurrent pneumonia/aspiration.  3. Restless leg syndrome.  4. Esophageal reflux (Dr. Marina Goodell).   HISTORY:  He says he is doing much better. He is more comfortable since  nifedipine was switched to Micardis which resolved some discomfort in  the legs, including leg edema and generalized malaise.  He still finds  trouble sleeping.  His nocturnal polysomnogram done May 31, 2007  confirms severe obstructive apnea with an index of 61.7 per hour,  moderate snoring and desaturation to 85%.  He was unable to try any of a  number of different CPAP and BiPAP adjustments and masks, so titration  could not be done.  This had been his experience in the past as well. He  is hopeful he that he might be able to get used to a nasal pillows mask  style.  We spent time today reviewing the medical significance of sleep  apnea and available treatments, noting that he is not overweight.  He  asks reevaluation by Dr. Marina Goodell of an esophageal stricture, asking what  causes my esophagus narrowing.  The front desk is going to help him  make a followup appointment with Dr. Marina Goodell.   MEDICATIONS:  1. Aspirin.  2. Mirapex 0.5 mg x2.  3. Celebrex 200 mg b.i.d.  4. Metoprolol 100 mg b.i.d.  5. Nasonex.  6. Zegerid 40 mg.  7. Micardis 40/80.   Drug intolerance TRAZODONE, PENICILLIN and VIOXX.   OBJECTIVE:  Weight 173 pounds.  BP 126/60.  Pulse 58, room air  saturation 99%.  He is not overweight.  Lung fields are clear.  Palate spacing is 3 to 4/4 with high arched palate, status post  uvuloplasty.  There is no stridor, voice quality is normal.   IMPRESSION:  1. Severe obstructive sleep apnea, probable basis for his  complaints      of waking after sleep onset and nonrestorative sleep.  2. Recurrent pneumonias, likely associated with aspiration.  3. History of reflux and esophageal stricture.   PLAN:  1. We are going to work with CPAP titration and desensitization,      supplemented by  Temazepam 15 to 30 mg at night p.r.n.  2. Appointment with Dr. Marina Goodell to reassess his esophageal problems,      including reflux and stricture by his description.  3. Schedule return in 1 month, earlier p.r.n.     Clinton D. Maple Mccubbins, MD, Tonny Bollman, FACP  Electronically Signed    CDY/MedQ  DD: 06/18/2007  DT: 06/19/2007  Job #: [id number]   cc:   Willow Ora, MD

## 2011-05-07 NOTE — Telephone Encounter (Signed)
Does ECHO need to be without contrast or with?

## 2011-05-07 NOTE — Assessment & Plan Note (Signed)
[facility]                             PULMONARY OFFICE NOTE   [name], [name]                       MRN:          [id number]  DATE:05/14/2007                            DOB:          10-11-1941    PRIMARY CARE PHYSICIAN:  Willow Ora, M.D.   PULMONOLOGIST:  Charlcie Cradle. Delford Field, M.D.   PROBLEM:  1. Obstructive sleep apnea with hypersomnia.  2. Recurrent pneumonia/aspiration.  3. Restless legs syndrome.  4. Esophageal reflux.   HISTORY:  This gentleman is followed by Dr. Delford Field for recurrent  aspiration pneumonias.  We had stopped CPAP for him because he could not  keep it on on retrial.  He had previously seen Dr. Narda Bonds for ENT  surgical evaluation.  We had offered referral for oral appliances in  2006.  His original apnea/hypopnea index was 55 per hour, desaturating  to 83% in March of 2006.  He returns now saying he has had pneumonia  eight times.  He has come back to me because he says he is tired all  the time and can fall asleep in conversation.  He says he has no  motivation to get up and go outside to do anything and no stamina when  he gets there.  We talked again about his inability to make CPAP a  sustained therapy in 2006.  He says he is told now he has little snore  but does stop breathing.  His wife shakes him.  From the description I  am not sure if he might be having episodes of central apnea to explain  the lack of witnessed snoring.  Mirapex previously helped restless legs  quite a lot.  He is not sure if that is a problem any longer.  I told  him that both metoprolol and Mirapex might contribute to a sense of  daytime fatigue or tiredness.  He admits to some depression.   MEDICATIONS:  1. Aspirin.  2. Multivitamins.  3. Mirapex 0.5 mg x2 at h.s.  4. Celebrex 200 mg b.i.d.  5. Metoprolol.  6. Nasonex.  7. Zegerid 40 mg.  8. Nifedipine 60 mg.   ALLERGIES:  Drug intolerant:  1. TRAZODONE.  2. PENICILLIN.  3.  VIOXX.   OBJECTIVE:  VITAL SIGNS:  Weight 174 pounds, blood pressure 126/78,  pulse regular and 47, room air saturation 97%.  LUNGS:  Trace wheeze, left base.  HEART:  Heart sounds regular without murmur.  NEUROLOGIC:  There is no tremor or restlessness.  Affect is  unremarkable.  Speech quality is normal.   IMPRESSION:  1. Obstructive sleep apnea, currently untreated.  2. Complaint of daytime tiredness, which may be multifactorial,      recognizing possible contribution of medication effects, sleep      disturbance, and possibly depression.   PLAN:  1. We are scheduling a repeat sleep study and may need later to get a      multiple sleep latency test.  Consider referral for oral appliance.      Consider trial of Provigil.     Clinton D. Young,  MD, Tonny Bollman, FACP  Electronically Signed    CDY/MedQ  DD: 05/17/2007  DT: 05/18/2007  Job #: [id number]

## 2011-05-07 NOTE — Assessment & Plan Note (Signed)
[facility]                             PULMONARY OFFICE NOTE   [name], [name]                       MRN:          [id number]  DATE:05/13/2007                            DOB:          20-Apr-1941    [name] returns today in followup.  A 70 year old white male with  recent hemoptysis, acute bronchitis.  He was treated with a 10-day  course of Avelox.  Since he finished the course of therapy, his cough is  completely resolved.  He had a left, lower lobe, patchy nodular  infiltrate from an April chest x-ray which confirmed this, evidence of  pneumonia.  Pulmonary-wise he is improving.  He also received a course  of Cleocin 300 mg 3 times daily for 14 days.  He was switched over from  omeprazole to Zegerid 40 mg daily.  Overall he has improved.  He states  the Zegerid is working better than the omeprazole at this time.  He is  also on the aspirin 325 mg daily, Mirapex 0.5 mg 2 daily, metoprolol 100  mg b.i.d., Celebrex 200 mg b.i.d., nifedipine 60 mg daily.  He is off  Lyrica.   PHYSICAL EXAMINATION:  Temp was 98, blood pressure 162/78, pulse 47,  saturation 96% room air.  Chest showed to be clear, no evidence of wheeze or rhonchi.  CARDIAC EXAM:  Showed a regular rate and rhythm without S3, normal S1,  S2.  ABDOMEN:  Soft, nontender.  EXTREMITIES:  Showed no edema or clubbing.  SKIN:  Clear.   IMPRESSION:  Right lower lobe aspiration pneumonia now resolved.  Also  underlying obstructive sleep apnea, untreated with significant lethargy.   PLAN:  Refer back to Dr. Fannie Knee for further sleep reevaluation.  Pulmonary-wise, the patient will maintain Zegerid 40 mg daily for reflux  disease and will return this patient back to this office for followup as  needed.     Charlcie Cradle Delford Field, MD, [facility]  Electronically Signed    PEW/MedQ  DD: 05/13/2007  DT: 05/13/2007  Job #: [id number]   cc:   Willow Ora, MD

## 2011-05-07 NOTE — Telephone Encounter (Signed)
Per Dr.Paz needs ECHO w/o.

## 2011-05-10 NOTE — Op Note (Signed)
   [name], [name] NO.:  [id number]   MEDICAL RECORD NO.:  [id number]                   PATIENT TYPE:  AMB   LOCATION:  ENDO                                 FACILITY:  [facility]   PHYSICIAN:  Shan Levans, M.D. [facility]            DATE OF BIRTH:  [birth year]   DATE OF PROCEDURE:  10/25/2003  DATE OF DISCHARGE:                                 OPERATIVE REPORT   INDICATIONS FOR PROCEDURE:  Right upper lobe nodular infiltrate, evaluate  for cause.   SURGEON:  Shan Levans, M.D. [facility]   ANESTHESIA:  1% Xylocaine local.   PREMEDICATION:  Demerol 50 mg IV push, Versed 5 mg IV push.   DESCRIPTION OF PROCEDURE:  The Olympus video bronchoscope was introduced  through the right nares.  The upper airways were visualized and were  unremarkable.  The entire tracheobronchial tree was visualized and revealed  no endobronchial lesions, no excess secretions were seen, minimal  tracheobronchitis was identified.  Attention was then paid to the right  upper lobe under fluoroscopic control.  Transbronchial biopsies x6 were  obtained.  Bronchial washings were obtained as well.  There were no  complications.  Under fluoroscopy, nodular infiltrate could easily be seen  and was easily approached with transbronchial biopsies.   COMPLICATIONS:  None.   IMPRESSION:  Right upper lobe nodular infiltrate, rule out alveolar cell  carcinoma versus other infiltrate or pulmonary process.   RECOMMENDATIONS:  Follow with pathology and microbiology.                                               Shan Levans, M.D. [facility]    PW/MEDQ  D:  10/25/2003  T:  10/25/2003  Job:  [id number]   cc:   Angelena Sole, M.D. [facility]
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
## 2011-05-10 NOTE — H&P (Signed)
NAME:  [name], [name] NO.:  [id number]   MEDICAL RECORD NO.:  [id number]                   PATIENT TYPE:  OUT   LOCATION:  XRAY                                 FACILITY:  [facility]   PHYSICIAN:  Wanda Plump, MD [facility]                 DATE OF BIRTH:  10/16/1941   DATE OF ADMISSION:  11/01/2003  DATE OF DISCHARGE:  11/01/2003                                HISTORY & PHYSICAL   CHIEF COMPLAINT:  Fever.   HISTORY OF PRESENT ILLNESS:  [name] is a 70 year old white male who  presented today to the office complaining of fever.  His problems started  last night when he began coughing and had some heartburn.  He woke up  earlier today with a temperature of 102, along with chills and diaphoresis.  His temperature has somehow subsided with Tylenol and rest.  His wife  noticed him to have shallow and labored breathing earlier today.  At the  office, the patient was diagnosed with pneumonia, and has been admitted for  further work-up.   PAST MEDICAL HISTORY:  1. Arthritis of the neck.  2. Chronic paresthesias.  3. History of severe bilateral pneumonia about a year ago, and at that time     he was admitted to the [facility].  4. History of severe sleep apnea diagnosed in May of 2004.  He was operated     on on December of 2004 by Dr. Ezzard Standing, who performed a     uvulopalatopharyngoplasty, as well as turbinate reduction.  5. History of GERD.  6. History of a negative echo in October of 2004.   FAMILY HISTORY:  Positive for coronary artery disease, bladder cancer, and  prostate cancer.   SOCIAL HISTORY:  The patient drinks very seldom.  He used to smoke a pipe,  but right now he only chews on his pipe, and does not inhale the smoke.   REVIEW OF SYSTEMS:  He has started with some URI type of symptoms today,  characterized by runny nose.  He denies any sore throat or sinus pain.  He  denies any chest pain.  He did have some lower extremity edema.  No  nausea,  vomiting, or diarrhea.  No dysuria.  No chest congestion that he can tell.   MEDICATIONS:  1. Neurontin 300 mg one in the morning and 3 at bedtime.  2. Doxepin 25, 1-2 capsules at bedtime.  3. Exelon 6 mg one p.o. b.i.d.  4. Protonix 40 one b.i.d.  5. Mirapex 25 mg two tablets at bedtime.  6. HCTZ 25 mg one p.o. daily.  7. Albuterol inhaler as needed.  8. Advair 250/50 twice a day.  9. Concerta 10 mg 1-2 tablets daily.  10.      He takes a number of vitamins and minerals.   ALLERGIES:  1. PENICILLIN.  2. VIOXX.  3. FLU  SHOT.   PHYSICAL EXAMINATION:  GENERAL:  At the time of my examination, the patient  is alert, oriented.  He does not look toxic, but he is certainly weak.  VITAL SIGNS:  O2 saturation is 95%.  He weighs 172 pounds.  Afebrile at this  point.  Pulse 86, respirations 18, blood pressure 110/60.  HEENT:  Ears are normal.  Oropharynx is normal without redness or discharge.  LUNGS:  He has a few rhonchi bilaterally, but no respiratory distress.  CARDIOVASCULAR:  Regular rate and rhythm without a murmur.  EXTREMITIES:  No edema.  ABDOMEN:  Not distended, soft with good bowel sounds.  No organomegaly.  SKIN:  There is no rash.  NEUROLOGIC:  Speech, gait, and motor are intact.   LABORATORY AND X-RAYS:  Chest x-ray today shows extensive right middle lobe  pneumonia.  This looks very similar to the pneumonia he had a year ago.  However, I was able to get a report from a chest x-ray done in January 2005,  and it is read as normal.  Consequently, this is an acute infiltrate.   ASSESSMENT AND PLAN:  [name] has been admitted to the hospital with  extensive pneumonia.  After appropriate cultures are taken, I am going to  start him on Levaquin 500 mg IV daily.  Will continue with his routine  medications, as well as some Tylenol and oxygen supplementation.                                                Wanda Plump, MD [facility]    JEP/MEDQ  D:  05/04/2004  T:   05/04/2004  Job:  [id number]

## 2011-05-10 NOTE — Discharge Summary (Signed)
NAMEADDIS, TUOHY NO.:  [id number]   MEDICAL RECORD NO.:  [id number]          PATIENT TYPE:  INP   LOCATION:  0364                         FACILITY:  [facility]   PHYSICIAN:  Shan Levans, M.D. LHCDATE OF BIRTH:  1941/07/05   DATE OF ADMISSION:  01/28/2005  DATE OF DISCHARGE:  01/31/2005                                 DISCHARGE SUMMARY   DISCHARGE DIAGNOSIS:  Suspected aspiration pneumonia, recurrent.   HISTORY OF PRESENT ILLNESS:  Mr. Maul is a complex 70 year old gentleman  who presented to the emergency department  on January 28, 2005, at [facility] with altered mental status, i.e., being obtunded, rapid  shallow respirations, fever greater than 101, and nonproductive cough. He is  a pipe smoker, a Information systems manager. He does have a complex past  medical history. He was in his usual state of health and went to work on  January 27, 2005. In the early a.m. he developed rapid shallow respirations,  muscle spasms, decreased level of consciousness, labored breathing, and  fever to 101. Due to his history of recurrent pneumonia in his right upper  lobe, IgG deficiency, obstructive sleep apnea, and Agent Orange exposure he  was admitted for further evaluation and treatment at that time.   PAST MEDICAL HISTORY:  Significant for recurrent pneumonia with fiberoptic  bronchoscopy demonstrating a Klebsiella bacteria. He also has a history of  pneumothorax following fine-needle aspiration in December 2005 on the right.  He also has a known gait disorder, short-term memory loss, attention deficit  disorder that is followed by Dr. Rosario Jacks in [place], [place]. He also has restless leg syndrome, cervical disk degeneration. He  has multiple injuries secondary to [employer] and combat during  [place]. He also has borderline diabetes mellitus. His allergies consist of  PENICILLIN which creates a rash and __________ which creates  mental status  changes.   LABORATORY DATA:  Drug screen was negative. Urine culture was unremarkable  without growth. Arterial blood gas on room air revealed pH of 7.46, PCO2 36,  PO2 of 61 with a bicarbonate of 26. WBC 23.1, hemoglobin 14.5, hematocrit  42.6, platelet count 227,000, absolute neutrophil count 21.5, and  lymphocytes 3, Absolute lymphocyte count 0.7. Monocytes 0.4.  Absolute  monocyte count 0.9 and eosinophils 0, eosinophils absolute 0, and basophils  of 0.  AST 22, ALT 19, alkaline phosphatase 82, total bilirubin 0.9.  Hemoglobin A1C is 5.6.  CK is 21, CK-MB 1.0, troponin-I 0.01. TSH is 0.274.  Cortisol level was 12.8. PSA 0.35.  Blood culture showed no growth. Urine  culture showed no growth.  Legionella was negative. IgA serum was 175, IgG  serum was 545, IgM serum was 54. Brain natriuretic peptide less than 30.   RADIOGRAPHIC DATA:  Chest x-ray demonstrated right upper lobe pneumonia  which was improving. CT of the chest with contrast demonstrated progressive  airspace density in the right upper lobe which could represent progressive  pneumonia or questionable alveolar cell carcinoma, a 17-mm right hilar lymph  node which was seen previously but may be  a millimeter or 2 larger. Slight  worsening of opacity density in the superior segment of the right lower  lobe. Other areas of patchy density in the right lower lobe have resolved.  This was read by Dr. Janeece Riggers. [place] of radiology.   HOSPITAL COURSE BY DISCHARGE DIAGNOSES:  1.  Recurrent right upper lobe pneumonia, questionable etiology,      questionable aspiration. Mr. Hardman was admitted to [facility]      and treated with the usual pharmaceutical interventions of IV      antibiotics with continuation of oral prednisone. Cultures were      unremarkable. ID consult was called by Dr. Lenn Sink, evaluated,      and did not feel that IgG deficiency was creating this problem.      Therefore, he will be  treated with continuation of ten days more of      equivalent therapy with Avelox and then followed up with Dr. Delford Field. It      was doubtful that we were seeing __________ pneumonia or autoimmune      response or IgG and immune system was playing a role. There was some      question of having further evaluation at the [facility],      i.e., [facility] or [facility]      in the future.  2.  Attention deficit disorder. He is being followed by Dr. Rosario Jacks in      [place], [place]. He will continue on his current medications      until further evaluated by her. He will also continue on his medications      for narcolepsy and catalepsy.   DISCHARGE MEDICATIONS:  1.  Aspirin 325 mg one daily.  2.  Neurontin 300 mg one in the a.m., one at 12 noon, and 4 at bedtime.  3.  Doxepin 50 mg q.h.s.  4.  Exelon  6 mg one or two times a day.  5.  Namenda 10 mg two times a day.  6.  Zanaflex 4 mg one four times a day.  7.  Mirapex 1 mg q.h.s.  8.  Protonix 4 mg b.i.d.  9.  Avelox 400 mg daily until gone.  10. __________ salts 10 mg one to two daily.   ACTIVITY:  As tolerated.   DIET:  No-concentrated sweets.   SPECIAL INSTRUCTIONS:  To call for any problems. He has a followup with Dr.  Shan Levans on February 23, 006, at 4:50 p.m.   DISPOSITION/CONDITION ON DISCHARGE:  His altered mental status has improved.  He is now oriented without problems and his fever has resolved. He is being  discharged home in improved condition. Of note, he has  requested a copy of his chart to be sent to Dr. Rosanne Ashing Ward in [place],  [place], who is a [employer] fast streamer at the Department  of [employer]. His fax number is [phone].  He is located at [address], [place], [place], [address].      SM/MEDQ  D:  01/31/2005  T:  01/31/2005  Job:  [id number]   cc:   Rockey Situ. Flavia Shipper.,  M.D. [address]  [place]  [place] [address]  Fax: [phone]   Rosario Jacks  [address]  [place]  [place] [address]  Fax: [phone]   Genene Churn. Love, M.D.  [address]  [place] [address]  Fax: [phone]

## 2011-05-10 NOTE — Op Note (Signed)
NAME:  [name], STRAUCH                          ACCOUNT NO.:  [id number]   MEDICAL RECORD NO.:  [id number]                   PATIENT TYPE:  OUT   LOCATION:  XRAY                                 FACILITY:  [facility]   PHYSICIAN:  Kristine Garbe. Ezzard Standing, M.D.         DATE OF BIRTH:  January 29, 1941   DATE OF PROCEDURE:  12/14/2003  DATE OF DISCHARGE:  11/01/2003                                 OPERATIVE REPORT   PREOPERATIVE DIAGNOSIS:  Obstructive sleep apnea.  Turbinate hypertrophy.   POSTOPERATIVE DIAGNOSIS:  Obstructive sleep apnea.  Turbinate hypertrophy.   OPERATION PERFORMED:  1. Uvulopalatopharyngoplasty.  2. Bilateral inferior turbinate reductions.   SURGEON:  Kristine Garbe. Ezzard Standing, M.D.   ANESTHESIA:  General endotracheal.   COMPLICATIONS:  None.   INDICATIONS FOR PROCEDURE:  [name] is a 70 year old gentleman with a  diagnosis of obstructive sleep apnea.  He underwent a sleep test which  showed RDI of 59.  He has tried nasal CPAP.  Really did not tolerate this  well.  He is taken to the operating room at this time for turbinate  reductions and uvulopalatopharyngoplasty.   DESCRIPTION OF PROCEDURE:  After adequate endotracheal anesthesia, the  patient received 1 g IV preoperatively as well as 8 mg of Decadron.  The  nose was examined first.  The patient had large inferior turbinates.  He had  mild septal deformity to the left but this was causing only minimal  obstruction.  The turbinates were injected with 0.25% Marcaine with  1:100,000 epinephrine.  Cotton pledgets soaked in Afrin were placed for  decongestion and hemostasis.  After injecting the turbinates, incision was  made along the inferior edge of the turbinates.  The mucosa was elevated off  the turbinate bone and submucosal resection of turbinate bone was performed  bilaterally.  In addition, on the left side, a portion of the inferior  turbinate was amputated.  The remaining turbinate tissue was outfractured  and suction cautery was used for hemostasis.  After performing inferior  turbinate reductions and outfracturing the turbinates, mouth gag was used to  expose the oropharynx.  The patient was status post tonsillectomy.  Using  cautery, the distal 0.5 to 1 cm of soft palate was resected including the  uvula.  Hemostasis was obtained with cautery.  Mucosal edges of the palate  were then reapproximated with 4-0 Vicryl and 4-0 chromic sutures.  This  completed the procedure.  There was no substantial bleeding.  The oropharynx  was irrigated with saline.  The patient was awakened from anesthesia and  transferred to recovery room postoperatively doing well.   DISPOSITION:  The patient will be observed and admitted to the intensive  care unit overnight and discharged to home in the morning on Keflex 500 mg  twice daily for one week, Tylenol and Lortab elixir 1 to 1-1/2 tablespoons  every four hours as needed for pain.  Have him follow up in my  office in two  weeks for recheck.                                               Kristine Garbe. Ezzard Standing, M.D.    CEN/MEDQ  D:  12/14/2003  T:  12/15/2003  Job:  [id number]   cc:   Angelena Sole, M.D. [facility]

## 2011-05-10 NOTE — Procedures (Signed)
NAMEARTURO, SOFRANKO NO.:  [id number]   MEDICAL RECORD NO.:  [id number]          PATIENT TYPE:  OUT   LOCATION:  SLEEP LAB                    FACILITY[facility]  [facility]   PHYSICIAN:  Marcelyn Bruins, M.D. [facility] DATE OF BIRTH:  1941/03/20   DATE OF STUDY:  02/28/2005                              NOCTURNAL POLYSOMNOGRAM   REFERRING PHYSICIAN:  Dr. Shan Levans   INDICATION FOR THE STUDY:  Hypersomnia with sleep apnea.  Epworth score is  14.   SLEEP ARCHITECTURE:  The patient had total sleep time of 398 minutes with  decreased REM and slow wave sleep. Sleep onset latency was very rapid at 1-  1/2 minutes, and REM onset was normal.   IMPRESSION:  1.  Severe obstructive sleep apnea/hypopnea syndrome with a respiratory      disturbance index of 55 events per hour and O2 desaturation as low as      83%.  Most of the study was spent in the supine position.  2.  Severe snoring noted throughout.  3.  No clinically significant cardiac arrhythmias.      KC/MEDQ  D:  03/19/2005 10:56:07  T:  03/19/2005 15:41:03  Job:  [id number]   cc:   Shan Levans, M.D. [facility]

## 2011-05-10 NOTE — Consult Note (Signed)
NAMEMASAYOSHI, [name] NO.:  [id number]   MEDICAL RECORD NO.:  [id number]          PATIENT TYPE:  INP   LOCATION:  0375                         FACILITY:  [facility]   PHYSICIAN:  [name], M.D. DATE OF BIRTH:  [birth year]   DATE OF CONSULTATION:  11/29/2004  DATE OF DISCHARGE:                                   CONSULTATION   REFERRING PHYSICIAN:  Shan Mckee, M.D.   REASON FOR CONSULTATION:  1.  Recurrent pneumonia.  2.  Hypogamma globulinemia.   HISTORY OF PRESENT ILLNESS:  [name] is a very nice 70 year old white  gentleman who was a former career Conservation officer, historic buildings.  He was in [place].  He  states a lot of exposure to toxic chemicals, including Agent Orange.  He has  been in [place] for several years.  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  He is followed by [name].  He apparently has had problems with  recurrent pneumonia.  He has had bronchoscopy in the past which has not  shown any endobronchial lesions.  He has also had problems with respect to  syncope.  He has been seen by a physician out at [facility] for this.  No  studies have been done to elucidate this according to [name].  [name] is seeing him at the present time.  [name] did has last  bronchoscopy prior to this admission back in September 2005.  Everything  looked unremarkable.  Preoperatively [name] did have a CT scan done this  admission.  This was done on November 28, 2004.  The CT scan did show a mass-  like density in the right upper lobe, measuring 3.9 x 2.7 cm.  There was  some small central air bronchograms.  Extensive infiltrate noted in the  right upper lung.  There is decrease in his precarinal and right hilar  adenopathy.  CT scan of the head was unremarkable.  He also underwent an MRI  of the entire spine.  This was done on November 28, 2004.  CT scan of the  cervical spine showed no remarkable findings.  The CT scan of the thoracic  spine  showed no evidence of bony destruction.  CT scan of the lumbar spine  showed degenerative changes but nothing that was considered significant.  He  has been found to have decreased IgG level.  On November 27, 2004,  quantitative immunoglobulins showed an IgG of 588.  He had IJ level of 194  and IgM of 64 which were both normal.   He has seen [name] who did not find any autoimmune or causing vascular  abnormality.  He is also followed by [name].  [name] has not  uncovered any obvious infectious etiology.  [name] did order specific  studies to try to further evaluate the IgG deficiency.  [name] did  receive a dose of IVIG of 20 g on November 28, 2004.  He has lost about 30  pounds.  He has not had any change in bowel or bladder  problems.  He has had  no rashes.  He has not had any joint problems.  He has not noted any change  in vision.   PAST MEDICAL HISTORY:  1.  History of gastroesophageal reflux disease.  2.  ADD found in 1999.  3.  Suspected early Alzheimer's.  4.  Restless leg syndrome.  5.  Sleep apnea, status post surgical intervention.   ALLERGIES:  1.  PENICILLIN.  2.  VIOXX/CELEBREX.   CURRENT MEDICATIONS:  1.  Neurontin 900 mg  at night.  2.  Doxepin 25 mg daily.  3.  Amphetamine salts.  4.  Mirapex.  5.  Protonix 40 mg p.o. b.i.d.  6.  Nimenda 5 mg daily.  7.  Prednisone 5 mg daily.  8.  Aspirin 81 mg p.o. daily.  9.  Percocet as needed.  10. Exelon 6 mg b.i.d.  11. Ritalin 10 mg daily.   SOCIAL HISTORY:  He does smoke a pipe.  He has not had any alcohol for six  months.   FAMILY HISTORY:  Brother died at age 56 with lung cancer.  Mother died at  age 62 with sepsis.   REVIEW OF SYSTEMS:  As in the history of present illness.   PHYSICAL EXAMINATION:  GENERAL:  Well-developed, well-nourished white  gentleman in no obvious distress.  Alert and oriented x3.  VITAL SIGNS:  Temperature 97.4, pulse 69, respiratory 16, blood pressure  139/84.   HEENT:  Normocephalic, atraumatic skull.  No ocular or oral lesions.  No  thrush is noted.  NECK:  No adenopathy.  There is no palpable thyroid.  LUNGS:  Clear to percussion and auscultation bilaterally.  CARDIAC:  Regular rate and rhythm with a normal S1 and S2.  No murmurs, rubs  or bruits.  ABDOMEN:  Soft. Good bowel sounds.  No palpable abdominal mass.  No palpable  hepatosplenomegaly.  BACK:  No tenderness over the spine, ribs or hips.  EXTREMITIES:  No clubbing, cyanosis or edema.  He has good symmetric  strength bilaterally.  There is no joint swelling, erythema, or warmth.  SKIN:  No rashes, ecchymosis or petechiae.  NEUROLOGIC:  No focal neurological deficits.   ADDITIONAL LABORATORY DATA:  His CD4 count is 510 (normal [phone]).  TSH  0.713.  Blood cultures are negative.  His metabolic panel shows a BUN of 7,  creatinine 1.  LFTs are within normal limits.  His total protein is 6,  albumin 3.  Urinalysis is negative for protein.  Prothrombin time is 11.5  seconds with PTT of 20 seconds.   IMPRESSION:  [name] is a 70 year old gentleman with decreased IgG.  This  is certainly unclear as to the nature of this.  His IgG level certainly is  not that depressed that he should be having problems with recurrent  infection.  Typically, patients who have pulmonary and upper respiratory  tract infections often have depressed IgA levels.  Would think it would be  unlikely that he has any underlying hematologic abnormality.  One always  could check for a myeloma.  In particular, light chain disease certainly  could be evaluated.  As such, we can work him up as an outpatient easily  enough.  We could easily get a 24-hour urine.  We now have a serum light  chain assay that could be done.   I do not see any obvious signs of malignancy.  He did have a lung biopsy  today.  I suspect that this would not  be malignant.  I certainly can understand the need for IVIG.  This hopefully will  help  provide a boost to his immune system.  I think studies are controversial  as to whether or not maintenance IVIG is helpful.   Mr. Barich certainly is a nice guy.  We may end up having to do a bone  marrow biopsy on him if any of our studies are inconclusive.  I will  certainly follow along both as an inpatient and outpatient.   Thank you so much for allowing me to see Mr. Dibello.     Pete   PRE/MEDQ  D:  11/29/2004  T:  11/30/2004  Job:  [id number]   cc:   [name], M.D. [facility]   Angelena Sole, M.D. [facility]   Genene Churn. Love, M.D.  [address]  [address]  [place]  [place] [address]  Fax: [phone]   Fransisco Hertz, M.D.  [address]  [place] [address]  Fax: [phone]   Areatha Keas, M.D.  [address]  [address]  [place]  [place] [address]  Fax: [phone]

## 2011-05-10 NOTE — Assessment & Plan Note (Signed)
[facility]                             PULMONARY OFFICE NOTE   [name], [name]                       MRN:          [id number]  DATE:04/17/2007                            DOB:          09-05-1941    [name] is a 70 year old white male whom I have seen previously for  recurrent aspiration pneumonia, last seen in this office in May of 2007.  He has noted 10 days of coughing up brown and bloody material,  associated with green mucus and fever to 100 degrees.  No shortness of  breath or wheezing.   MEDICATIONS:  1. He is finishing 7 days of a 10-day course of Avelox.  2. Maintenance program includes aspirin 325 mg daily.  3. Multivitamins daily.  4. Potassium daily.  5. Mirapex 1 mg at bedtime.  6. Celebrex 200 mg b.i.d.  7. Metoprolol 100 mg b.i.d.  8. Mucinex b.i.d.  9. Omeprazole 20 mg b.i.d.  10.Nifedipine 60 mg daily.  11.Lyrica 50 mg t.i.d.  12.Nasonex b.i.d. 2 sprays.   EXAM:  Temperature was 97.5, blood pressure 128/72, pulse 53, saturation  is 96% on room air.  CHEST:  Shows distant breath sounds without evidence of wheeze or  rhonchi.  Chest exam also revealed rales at the left base.  CARDIAC:  Regular rate and rhythm without S3.  Normal S1, S2.  ABDOMEN:  Soft.  Nontender.  EXTREMITIES:  No edema or clubbing.  SKIN:  Clear.   Chest x-ray today showed infiltrate, left lower lobe, patchy and  nodular.  CT scan from April 10, 2007 confirmed this.   IMPRESSION:  Chronic recurrent aspiration pneumonia with acute infection  and lower airway inflammation with esophageal dysmotility.   PLAN:  Add Cleocin 300 mg 3 times daily for 14 days.  Switch omeprazole  to Zegerid 40 mg daily.  Finish a course of Avelox.  Return to this  office in 3 weeks.     Charlcie Cradle Delford Field, MD, [facility]  Electronically Signed    PEW/MedQ  DD: 04/17/2007  DT: 04/17/2007  Job #: [id number]   cc:   Willow Ora, MD

## 2011-05-10 NOTE — Op Note (Signed)
NAME:  [name], [name] NO.:  [id number]   MEDICAL RECORD NO.:  [id number]                   PATIENT TYPE:  OUT   LOCATION:  CARD                                 FACILITY:  [facility]   PHYSICIAN:  Shan Levans, M.D. [facility]            DATE OF BIRTH:  06/02/1941   DATE OF PROCEDURE:  08/29/2004  DATE OF DISCHARGE:                                 OPERATIVE REPORT   PROCEDURE:  Bronchoscopy.   INDICATIONS:  Chronic right upper lobe infiltrate.   OPERATOR:  Shan Levans, M.D.   ANESTHESIA:  1% Xylocaine local.   PREMEDICATION:  Demerol 40 mg IV push, Versed 4 mg IV push.   PROCEDURE:  The Pentax video bronchoscope was introduced through the right  naris.  The upper airways were visualized and were unremarkable.  The entire  tracheobronchial tree was visualized and revealed diffuse tracheobronchitis,  particularly involving the right upper lobe orifice.  Bronchoalveolar lavage  was obtained from the right upper lobe, 150 mL infused saline with 80 mL  return.  Transbronchial biopsies also obtained from similar areas of the  apical and posterior segments, right upper lobe.   COMPLICATIONS:  None.  Minimal bleeding controlled with topical epinephrine.   IMPRESSION:  Chronic organized pneumonia and inflammation, bronchitis, right  upper lobe.   RECOMMENDATIONS:  Follow up microbiology and pathology.                                               Shan Levans, M.D. [facility]    PW/MEDQ  D:  08/29/2004  T:  08/29/2004  Job:  [id number]   cc:   Wanda Plump, MD [facility]  [phone] [address] [place], [place] [address]

## 2011-05-10 NOTE — Discharge Summary (Signed)
NAME:  [name], [name] NO.:  [id number]   MEDICAL RECORD NO.:  [id number]                   PATIENT TYPE:  INP   LOCATION:  5506                                 FACILITY:  [facility]   PHYSICIAN:  Corwin Levins, M.D. [facility]             DATE OF BIRTH:  November 18, 1941   DATE OF ADMISSION:  05/04/2004  DATE OF DISCHARGE:  05/07/2004                                 DISCHARGE SUMMARY   DISCHARGE DIAGNOSES:  1. Right-sided pneumonia.  2. Confusion.  3. Chronic seizures.  4. Cervical spine degenerative joint disease.  5. Obstructive sleep apnea.  6. Gastroesophageal reflux disease.  7. Slight glucose intolerance.   See otherwise history and physical on November 01, 2003.   PROCEDURE:  Chest CT with contrast.  No evidence of pulmonary embolism.  Right upper lobe and right lower lobe pneumonia, probable reactive right  hilar adenopathy.  Recommend followup chest x-ray at some point.   CONSULTATIONS:  None.   HISTORY OF PRESENT ILLNESS:  See the dictated date of admission, May 04, 2004.   HOSPITAL COURSE:  [name] is a very nice 70 year old white male with a  history of pneumonia, bilateral, in 2003 at [facility], as well as  sleep apnea and chronic pipe smoking who presents with left chest discomfort  and signs and symptoms suggestive of pneumonia, right side.  He was treated  aggressively with nebulizer treatments, O2, IV Avelox to which he responded  quite nicely with remarkable improvement over the next three days.  There  was also some slight glucose intolerance with glucose 140 and slight low  potassium with potassium of 3.2.  White blood cell count 19.9 at time of  admission.  Chest x-ray suggestive of pneumonia.  Chest CT done as above to  further investigate left posterior chest discomfort which was negative for  PE.  He became afebrile.  White blood cell count reduced to 12.9.  Hemoglobin remained stable.  Potassium rose to 3.6 and glucose  only slightly  high at 129 with hemoglobin A1c overall 5.5.  There were some mental status  changes at the onset as well for which the Ritalin was discontinued.  Transiently, he was restarted on this without difficulty and confusion  seemed to resolve with the improvement of pneumonia.  He was otherwise  continuing all other medications.  As he was ambulating, eating well, O2  saturation 95% on room air, and otherwise as above, he was felt to have  gained maximal benefit from his hospitalization at the time of discharge.  He was therefore to be discharged to home in good condition.  There are no  activity or dietary restrictions.  He needs to follow up with Dr. Drue Novel in 1-2  weeks.   DISCHARGE MEDICATIONS:  To include Avelox 400 mg p.o. daily to start May 08, 2004 for six days and all home medications to include:  1. Advair 250/50 one puff b.i.d.  2. Aldactone 25 mg p.o. daily.  3. Exelon 3-mg capsule 6 mg b.i.d.  4. K-Dur 20 mEq p.o. b.i.d.  5. Mirapex 0.25-mg tablet with 0.5 mg q.h.s.  6. Neurontin 300 mg q.a.m. with 900 mg q.h.s.  7. Protonix 40 mg b.i.d.  8. Ritalin 10 mg daily.                                                Corwin Levins, M.D. [facility]    JWJ/MEDQ  D:  05/07/2004  T:  05/07/2004  Job:  [id number]

## 2011-05-14 ENCOUNTER — Encounter: Payer: Self-pay | Admitting: Internal Medicine

## 2011-05-14 ENCOUNTER — Ambulatory Visit (INDEPENDENT_AMBULATORY_CARE_PROVIDER_SITE_OTHER): Payer: Medicare Other | Admitting: Internal Medicine

## 2011-05-14 DIAGNOSIS — Z23 Encounter for immunization: Secondary | ICD-10-CM

## 2011-05-14 DIAGNOSIS — Z125 Encounter for screening for malignant neoplasm of prostate: Secondary | ICD-10-CM

## 2011-05-14 DIAGNOSIS — M1990 Unspecified osteoarthritis, unspecified site: Secondary | ICD-10-CM | POA: Insufficient documentation

## 2011-05-14 DIAGNOSIS — Z8042 Family history of malignant neoplasm of prostate: Secondary | ICD-10-CM

## 2011-05-14 DIAGNOSIS — Z0000 Encounter for general adult medical examination without abnormal findings: Secondary | ICD-10-CM

## 2011-05-14 DIAGNOSIS — G459 Transient cerebral ischemic attack, unspecified: Secondary | ICD-10-CM

## 2011-05-14 DIAGNOSIS — E785 Hyperlipidemia, unspecified: Secondary | ICD-10-CM

## 2011-05-14 DIAGNOSIS — I10 Essential (primary) hypertension: Secondary | ICD-10-CM

## 2011-05-14 DIAGNOSIS — M542 Cervicalgia: Secondary | ICD-10-CM

## 2011-05-14 DIAGNOSIS — M899 Disorder of bone, unspecified: Secondary | ICD-10-CM

## 2011-05-14 LAB — LDL CHOLESTEROL, DIRECT: Direct LDL: 189.3 mg/dL

## 2011-05-14 LAB — BASIC METABOLIC PANEL
CO2: 29 mEq/L (ref 19–32)
Calcium: 9.8 mg/dL (ref 8.4–10.5)
Creatinine, Ser: 1.1 mg/dL (ref 0.4–1.5)
GFR: 73.41 mL/min (ref >60.00)

## 2011-05-14 LAB — LIPID PANEL
Cholesterol: 226 mg/dL — ABNORMAL HIGH (ref 0–200)
HDL: 48.8 mg/dL
Total CHOL/HDL Ratio: 5
Triglycerides: 150 mg/dL — ABNORMAL HIGH (ref 0.0–149.0)
VLDL: 30 mg/dL (ref 0.0–40.0)

## 2011-05-14 NOTE — Assessment & Plan Note (Addendum)
Workup so far is negative. Brain MRI normal Carotid ultrasound  ",50%" plaque he had a stress echo, what he needed was a TTE which is pending. Based on the clinical course, symptoms are most likely related to cervical spine issues.

## 2011-05-14 NOTE — Assessment & Plan Note (Addendum)
At some point she was prescribed Fosamax Subsequently he stopped it apparently under the advice of orthopedic surgery. He has declined reclast before. Last bone density test showed osteopenia, April 2011. Patient has reported in the past that orthopedic surgeries following up on this problem.

## 2011-05-14 NOTE — Assessment & Plan Note (Signed)
F/u by the ortho

## 2011-05-14 NOTE — Assessment & Plan Note (Addendum)
Td 2002 pneumonia shot 03-2009 shingles shot -- 2008  Cscope several Cscopes due to polyps, last 12-07, TICS , no polyp; next  Is due , GI sent a letter, pt aware. At this point he has other problems that he needs to work on. Unable to exercise as much as he likes due to pain. Will reassess that in few months. Counseling about diet. Check a PSA, he has a family history of prostate cancer  (addendum, unable to check a PSA due to coding  issue. Will keep trying)

## 2011-05-14 NOTE — Assessment & Plan Note (Signed)
Mild hyperlipidemia, he has never taken medications to my knowledge. Labs

## 2011-05-14 NOTE — Progress Notes (Signed)
Subjective:    Patient ID: [name], male    DOB: 07-Oct-1941, 70 y.o.   MRN: [id number]  HPI Here for Medicare AWV:  1. Risk factors based on Past M, S, F history: reviewed 2. Physical Activities: no active x 1 year d/t pain  3. Depression/mood:  Pain is an issue but he remains optimistic  4. Hearing:  Decreased , uses hearing aids, no major problems noted today to normal  conversation 5. ADL's:  Independent except for unable to trim his toe nails, request a referal to podiatrist---- will do 6. Fall Risk: no recent falls, risk increased due to neck pain, counseled about prevention 7. home Safety: does feelsafe at home  8. Height, weight, &visual acuity: see VS, sees ophtalmology yearly, doing well 9. Counseling: provided 10. Labs ordered based on risk factors: if needed  11. Referral Coordination: if needed 12.  Care Plan, see assessment and plan  13.   Cognitive Assessment:motor skills limited by neck pain, cognition normal  In addition, today we discussed the following: Neck pain, no doing any better, he had a local injection few days ago, since then the pain is steady and radiates to the left shoulder, left arm, he has some tingling. TIA? At this point I think is more clear that sx are radicular in nature , see above. Had a "stress ECHO"", needs a TTE (pending) HTN-- amb BPs wnl Osteopenia -- not on Ca and vit D, not exercising   Past Medical History  Diagnosis Date  . GERD (gastroesophageal reflux disease)   . Hypertension   . RLS (restless legs syndrome)   . Back pain   . Spondylosis, cervical   . Sleep apnea     chronic, CPAP intolerant  . Decreased hearing   . Pneumonia     recurrent, bronchoscopy 2005: chronic organized pneumonia, Dr. Delford Field   . Alzheimer disease 2002    Dr.Ferrarou- sx resolved   . Depression   . Osteopenia    Past Surgical History  Procedure Date  . Tonsillectomy   . Uvoloplasty 2004  . Foot surgery 2006  . Nissen fundoplication 12/2009   . 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 No mental status changes. No chest pain or shortness of breath No nausea, vomiting, diarrhea. No blood in the stools    Objective:   Physical Exam  Constitutional: He is oriented to person, place, and time. He appears well-developed and well-nourished.       On no distress however is clear that his neck mobility is diminished.  HENT:  Head: Normocephalic and atraumatic.  Neck: No thyromegaly present.       Normal carotid pulses  Cardiovascular: Normal rate, regular rhythm and normal heart sounds.   No murmur heard. Pulmonary/Chest: Effort normal and breath sounds normal. No respiratory distress. He has no wheezes.  Abdominal: Soft. Bowel sounds are normal. He exhibits no distension. There is no tenderness. There is no rebound and no guarding.  Genitourinary: Rectum normal and prostate normal.  Neurological: He is alert and oriented to person, place, and time.       No formal neurological exam is done today   Psychiatric: He has  a normal mood and affect. His behavior is normal. Thought content normal.          Assessment & Plan:

## 2011-05-14 NOTE — Assessment & Plan Note (Signed)
Blood pressure well controlled, last potassium was slightly elevated. Check a BMP

## 2011-05-16 ENCOUNTER — Ambulatory Visit (HOSPITAL_COMMUNITY): Payer: Medicare Other | Attending: Internal Medicine

## 2011-05-16 DIAGNOSIS — G459 Transient cerebral ischemic attack, unspecified: Secondary | ICD-10-CM | POA: Insufficient documentation

## 2011-05-16 DIAGNOSIS — I379 Nonrheumatic pulmonary valve disorder, unspecified: Secondary | ICD-10-CM | POA: Insufficient documentation

## 2011-05-16 DIAGNOSIS — I079 Rheumatic tricuspid valve disease, unspecified: Secondary | ICD-10-CM | POA: Insufficient documentation

## 2011-05-16 DIAGNOSIS — E785 Hyperlipidemia, unspecified: Secondary | ICD-10-CM | POA: Insufficient documentation

## 2011-05-16 DIAGNOSIS — I059 Rheumatic mitral valve disease, unspecified: Secondary | ICD-10-CM | POA: Insufficient documentation

## 2011-05-16 DIAGNOSIS — I10 Essential (primary) hypertension: Secondary | ICD-10-CM | POA: Insufficient documentation

## 2011-06-18 ENCOUNTER — Other Ambulatory Visit: Payer: Self-pay | Admitting: Internal Medicine

## 2011-07-15 ENCOUNTER — Ambulatory Visit (HOSPITAL_COMMUNITY)
Admission: RE | Admit: 2011-07-15 | Discharge: 2011-07-15 | Disposition: A | Discharge status: Home / Self Care | Payer: Medicare Other | Source: Ambulatory Visit | Attending: Neurosurgery | Admitting: Neurosurgery

## 2011-07-15 ENCOUNTER — Encounter (HOSPITAL_COMMUNITY)
Admission: RE | Admit: 2011-07-15 | Discharge: 2011-07-15 | Disposition: A | Discharge status: Home / Self Care | Payer: Medicare Other | Source: Ambulatory Visit | Attending: Neurosurgery | Admitting: Neurosurgery

## 2011-07-15 ENCOUNTER — Other Ambulatory Visit (HOSPITAL_COMMUNITY): Payer: Self-pay | Admitting: Neurosurgery

## 2011-07-15 DIAGNOSIS — I10 Essential (primary) hypertension: Secondary | ICD-10-CM | POA: Insufficient documentation

## 2011-07-15 DIAGNOSIS — M47812 Spondylosis without myelopathy or radiculopathy, cervical region: Secondary | ICD-10-CM | POA: Insufficient documentation

## 2011-07-15 DIAGNOSIS — Z01818 Encounter for other preprocedural examination: Secondary | ICD-10-CM | POA: Insufficient documentation

## 2011-07-15 DIAGNOSIS — Z01812 Encounter for preprocedural laboratory examination: Secondary | ICD-10-CM | POA: Insufficient documentation

## 2011-07-15 LAB — CBC
Hemoglobin: 16 g/dL (ref 13.0–17.0)
MCH: 31.8 pg (ref 26.0–34.0)
Platelets: 255 10*3/uL (ref 150–400)
RBC: 5.03 MIL/uL (ref 4.22–5.81)
WBC: 8.8 10*3/uL (ref 4.0–10.5)

## 2011-07-15 LAB — BASIC METABOLIC PANEL
CO2: 33 mEq/L — ABNORMAL HIGH (ref 19–32)
Calcium: 10.5 mg/dL (ref 8.4–10.5)
Chloride: 101 mEq/L (ref 96–112)
Glucose, Bld: 124 mg/dL — ABNORMAL HIGH (ref 70–99)
Potassium: 4.2 mEq/L (ref 3.5–5.1)
Sodium: 142 mEq/L (ref 135–145)

## 2011-07-15 LAB — SURGICAL PCR SCREEN
MRSA, PCR: NEGATIVE
Staphylococcus aureus: NEGATIVE

## 2011-07-19 ENCOUNTER — Inpatient Hospital Stay (HOSPITAL_COMMUNITY)
Admission: RE | Admit: 2011-07-19 | Discharge: 2011-07-20 | DRG: 473 | Disposition: A | Discharge status: Home / Self Care | Payer: Medicare Other | Source: Ambulatory Visit | Attending: Neurosurgery | Admitting: Neurosurgery

## 2011-07-19 ENCOUNTER — Observation Stay (HOSPITAL_COMMUNITY): Payer: Medicare Other

## 2011-07-19 DIAGNOSIS — Z87891 Personal history of nicotine dependence: Secondary | ICD-10-CM

## 2011-07-19 DIAGNOSIS — I2510 Atherosclerotic heart disease of native coronary artery without angina pectoris: Secondary | ICD-10-CM | POA: Diagnosis present

## 2011-07-19 DIAGNOSIS — Z887 Allergy status to serum and vaccine status: Secondary | ICD-10-CM

## 2011-07-19 DIAGNOSIS — Z79899 Other long term (current) drug therapy: Secondary | ICD-10-CM

## 2011-07-19 DIAGNOSIS — M47812 Spondylosis without myelopathy or radiculopathy, cervical region: Principal | ICD-10-CM | POA: Diagnosis present

## 2011-07-19 DIAGNOSIS — Z981 Arthrodesis status: Secondary | ICD-10-CM

## 2011-07-19 DIAGNOSIS — Z01812 Encounter for preprocedural laboratory examination: Secondary | ICD-10-CM

## 2011-07-19 DIAGNOSIS — K219 Gastro-esophageal reflux disease without esophagitis: Secondary | ICD-10-CM | POA: Diagnosis present

## 2011-07-19 DIAGNOSIS — I10 Essential (primary) hypertension: Secondary | ICD-10-CM | POA: Diagnosis present

## 2011-07-19 DIAGNOSIS — Z880 Allergy status to penicillin: Secondary | ICD-10-CM

## 2011-07-26 ENCOUNTER — Emergency Department (HOSPITAL_COMMUNITY)
Admission: EM | Admit: 2011-07-26 | Discharge: 2011-07-26 | Disposition: A | Discharge status: Home / Self Care | Payer: Medicare Other | Attending: Emergency Medicine | Admitting: Emergency Medicine

## 2011-07-26 ENCOUNTER — Emergency Department (HOSPITAL_COMMUNITY): Payer: Medicare Other

## 2011-07-26 ENCOUNTER — Encounter (HOSPITAL_COMMUNITY): Payer: Self-pay | Admitting: Radiology

## 2011-07-26 DIAGNOSIS — F4489 Other dissociative and conversion disorders: Secondary | ICD-10-CM | POA: Insufficient documentation

## 2011-07-26 DIAGNOSIS — I10 Essential (primary) hypertension: Secondary | ICD-10-CM | POA: Insufficient documentation

## 2011-07-26 DIAGNOSIS — R51 Headache: Secondary | ICD-10-CM | POA: Insufficient documentation

## 2011-07-26 DIAGNOSIS — Z9889 Other specified postprocedural states: Secondary | ICD-10-CM | POA: Insufficient documentation

## 2011-07-26 DIAGNOSIS — R6889 Other general symptoms and signs: Secondary | ICD-10-CM | POA: Insufficient documentation

## 2011-07-26 DIAGNOSIS — M542 Cervicalgia: Secondary | ICD-10-CM | POA: Insufficient documentation

## 2011-07-26 LAB — CBC
HCT: 38.2 % — ABNORMAL LOW (ref 39.0–52.0)
Hemoglobin: 13.8 g/dL (ref 13.0–17.0)
MCV: 87.2 fL (ref 78.0–100.0)
RBC: 4.38 MIL/uL (ref 4.22–5.81)
RDW: 12 % (ref 11.5–15.5)
WBC: 13.6 10*3/uL — ABNORMAL HIGH (ref 4.0–10.5)

## 2011-07-26 LAB — POCT I-STAT, CHEM 8
Chloride: 98 mEq/L (ref 96–112)
HCT: 41 % (ref 39.0–52.0)
Hemoglobin: 13.9 g/dL (ref 13.0–17.0)
Potassium: 4.2 mEq/L (ref 3.5–5.1)
Sodium: 133 mEq/L — ABNORMAL LOW (ref 135–145)

## 2011-07-26 LAB — URINALYSIS, ROUTINE W REFLEX MICROSCOPIC
Bilirubin Urine: NEGATIVE
Hgb urine dipstick: NEGATIVE
Nitrite: NEGATIVE
Specific Gravity, Urine: 1.038 — ABNORMAL HIGH (ref 1.005–1.030)
Urobilinogen, UA: 0.2 mg/dL (ref 0.0–1.0)
pH: 5.5 (ref 5.0–8.0)

## 2011-07-26 LAB — DIFFERENTIAL
Basophils Absolute: 0.1 10*3/uL (ref 0.0–0.1)
Eosinophils Relative: 4 % (ref 0–5)
Lymphocytes Relative: 16 % (ref 12–46)
Lymphs Abs: 2.2 10*3/uL (ref 0.7–4.0)
Neutro Abs: 9.8 10*3/uL — ABNORMAL HIGH (ref 1.7–7.7)
Neutrophils Relative %: 72 % (ref 43–77)

## 2011-07-26 LAB — SEDIMENTATION RATE: Sed Rate: 65 mm/hr — ABNORMAL HIGH (ref 0–16)

## 2011-07-26 MED ORDER — IOHEXOL 300 MG/ML  SOLN
75.0000 mL | Freq: Once | INTRAMUSCULAR | Status: AC | PRN
  Administered 2011-07-26: 75 mL via INTRAVENOUS

## 2011-07-27 LAB — C-REACTIVE PROTEIN: CRP: 4.2 mg/dL — ABNORMAL HIGH (ref <0.6)

## 2011-07-28 LAB — URINE CULTURE: Culture  Setup Time: 201208032028

## 2011-08-07 NOTE — Op Note (Signed)
[name], KAUFFMAN NO.:  [id number]  MEDICAL RECORD NO.:  [id number]  LOCATION:  3599                         FACILITY:  [facility]  PHYSICIAN:  Donalee Citrin, M.D.        DATE OF BIRTH:  1941/10/06  DATE OF PROCEDURE:  07/19/2011 DATE OF DISCHARGE:                              OPERATIVE REPORT   PREOPERATIVE DIAGNOSIS:  Cervical spondylosis with stenosis at C3-C4, C4- 5, and C5-6, with left-sided C4-5, C5-6 radiculopathies.  PROCEDURE:  Anterior cervical diskectomies and fusion at  C3-C4, C4-5, and C5-6 using Globus PEEK cages, packed with locally harvested autograft, mixed with Actifuse, and Medtronic Venture plate with eight variable angle 15-mm screws, actually six 15 and two 13.  SURGEON:  Donalee Citrin, MD.  ASSISTANT:  Kathaleen Maser. Pool, MD.  ANESTHESIA:  General endotracheal.  HISTORY OF PRESENT ILLNESS:  The patient is a very pleasant 70-year gentleman, who has had progressively worsening neck, predominantly left webspace, shoulder, and upper arm pain, which got progressively worse, failed all forms of treatment.  MRI scan showed multilevel spondylosis and stenosis at all the left-sided pathology was C3-C4, C4-5, and C5-6, so the patient is recommended anterior cervical diskectomy and fusion at all those levels.  The risks and benefits of the operation were explained to the patient.  He understood and agreed to proceed forward.  DESCRIPTION OF PROCEDURE:  The patient was brought to the OR, induced under general anesthesia, positioned supine and the neck flexed in extension with 5 pounds of halter traction.  The right side of his neck was prepped and draped in usual sterile fashion.  After preop x-ray was localized at the appropriate level, a curvilinear incision was made just off the midline to the anterior border of the sternocleidomastoid.  The superficial layer of the platysma was dissected out and divided longitudinally.  The avascular plane between the  sternocleidomastoid and strap muscles was developed down to the prevertebral fascia. Prevertebral fascia was then dissected with Kittners.  Intraoperative x- ray identified the C4-5 disk space level, so annulotomy was made with 15- mm scalpel to mark the disk space.  The longus colli was then reflected laterally at all three disk space levels and self-retaining retractor was placed.  Anterior osteophytes were bitten off the C3-4, C4-5, and C5- 6 disk spaces medially.  Both first working at C4-5 and C5-6 disk space were drilled down, capturing the bone shavings in mucus trap down the posterior annulus and osteophyte complexes.  Then working at C4-5, aggressive under biting both endplates and removal of a large posterior osteophyte coming off the C4 vertebral body causing marked spinal cord compression.  This was all removed.  Marching cross laterally at C5 pedicle.  Both C5 nerve roots were palpated and decompressed, flushed with pedicle.  There was marked spondylosis and stenosis predominantly along the left C5 of proximal foramen.  This was all cleaned up and decompressing the fiber.  After adequate decompression both centrally and lateral had been achieved, attention was taken to C5-6 in similar fashion, C5-6 was cleaned out.  The stenosis here was predominantly a midline osteophyte, coming off the C5 vertebral body.  This was all  aggressively under bitten, decompressing the central canal.  Both C6 pedicles were identified.  Both C6 neural foramina were opened up. Then, the bone graft was placed, 8-mm graft to C4-5, 7-mm to C5-6, all placed 2-mm deep to the anterior vertebral line.  Then, working at C3-4, again disk space was widened and was drilled down.  Aggressive under biting of the C3 vertebral body and marching across laterally with marked hypertrophy on the left and this was all bitten away, removed, and decompressing the 4 root.  After removal of PLL, both C4 pedicles were  identified, both C4 nerve roots were patent, and this was packed with seven graft, and then a Venture initially translational plate was attempted to be placed, but due to the patient's loss of lordosis and unable to be bend the translational plate, this was discarded and a Venture plate was placed.  All eight screws had excellent purchase, two 30-mm screws initially, and then fluoroscopy confirmed that these were a little bit short, so they were replaced with 15-mm screws everywhere else.  All locking mechanisms were engaged.  The wound was copiously irrigated, meticulous hemostasis was maintained.  The platysma was reapproximated with interrupted Vicryl and the skin was closed with running 4-0 subcuticular, Benzoin and Steri-Strips applied.  The patient went to the recovery room in stable condition.  At the end of the case, sponge and instrument count was correct.          ______________________________ Donalee Citrin, M.D.     GC/MEDQ  D:  07/19/2011  T:  07/19/2011  Job:  [id number]  Electronically Signed by Donalee Citrin M.D. on 08/07/2011 10:17:50 AM

## 2011-08-07 NOTE — H&P (Signed)
  [name], [name]  MEDICAL RECORD NO.:  [id number]  LOCATION:  3599                         FACILITY:  [facility]  PHYSICIAN:  [name], M.D.        DATE OF BIRTH:  [birth year]  DATE OF ADMISSION:  07/19/2011 DATE OF DISCHARGE:  07/15/2011                             HISTORY & PHYSICAL   PREOPERATIVE DIAGNOSES:  Cervical spondylosis with stenosis at C3-4, C4- 5, and C5-6 with left-sided C4, 5, and 6 radiculopathies.  HISTORY OF PRESENT ILLNESS:  The patient is a 71 year old gentleman with longstanding neck and left arm pain really dating back to the late 60s, but worse over the last several weeks and months.  The patient failed physical therapy and injections.  An MRI scan showed severe facet arthropathy and foraminal stenosis at C3-4, 4-5, and 5-6.  His clinical syndrome is consistent with left-sided neck pain, shoulder pain, and pain radiating down to his elbow, not so much into his hand.  PAST MEDICAL HISTORY:  Remarkable for hypertension, coronary artery disease.  SURGICAL HISTORY:  Sleep apnea, laparoscopy, bunionectomy, tonsillectomy, sebaceous cyst removal, lumbar fusion in 2011.  ALLERGIES:  He has no medication allergies.  CURRENT MEDICATIONS:  Glucosamine, multivitamins, Micardis, hydrochlorothiazide, Celebrex, atenolol, __________, and acidophilus.  PHYSICAL EXAMINATION:  GENERAL:  The patient is a very pleasant, awake, and alert 70 year old gentleman in no acute distress. HEENT:  Within normal limits. NECK:  Has limited range of motion. EXTREMITIES:  Strength is 5/5 in his deltoid, biceps, triceps, __________ 5/5.  Sensation is grossly intact and reflexes are grossly intact.  MRI scan shows severe cervical stenosis.  ASSESSMENT/PLAN:  This is a 70 year old gentleman who presents for anterior cervical diskectomy and fusion.  I went over the risks and benefits of the operation with him.  He understood and agreed to  proceed forward.          ______________________________ [name], M.D.     GC/MEDQ  D:  07/19/2011  T:  07/19/2011  Job:  [id number]  Electronically Signed by [name] M.D. on 08/07/2011 10:17:47 AM

## 2011-08-22 ENCOUNTER — Other Ambulatory Visit: Payer: Self-pay | Admitting: Neurosurgery

## 2011-08-22 ENCOUNTER — Ambulatory Visit
Admission: RE | Admit: 2011-08-22 | Discharge: 2011-08-22 | Disposition: A | Discharge status: Home / Self Care | Payer: Medicare Other | Source: Ambulatory Visit | Attending: Neurosurgery | Admitting: Neurosurgery

## 2011-08-22 DIAGNOSIS — M542 Cervicalgia: Secondary | ICD-10-CM

## 2011-08-30 ENCOUNTER — Other Ambulatory Visit: Payer: Self-pay | Admitting: Internal Medicine

## 2011-09-03 NOTE — Telephone Encounter (Signed)
Rx Done . 

## 2011-09-19 ENCOUNTER — Ambulatory Visit
Admission: RE | Admit: 2011-09-19 | Discharge: 2011-09-19 | Disposition: A | Discharge status: Home / Self Care | Payer: Medicare Other | Source: Ambulatory Visit | Attending: Neurosurgery | Admitting: Neurosurgery

## 2011-09-19 ENCOUNTER — Other Ambulatory Visit: Payer: Self-pay | Admitting: Neurosurgery

## 2011-09-19 DIAGNOSIS — M79609 Pain in unspecified limb: Secondary | ICD-10-CM

## 2011-10-02 NOTE — Discharge Summary (Signed)
  NAMEAPOLLOS, TENBRINK NO.:  [id number]  MEDICAL RECORD NO.:  [id number]  LOCATION:                                 FACILITY:  PHYSICIAN:  Donalee Citrin, M.D.        DATE OF BIRTH:  04-29-1941  DATE OF ADMISSION:  09/19/2011 DATE OF DISCHARGE:                              DISCHARGE SUMMARY   ADMITTING DIAGNOSIS:  Cervical spondylosis.  DISCHARGE DIAGNOSIS:  Cervical spondylosis.  PROCEDURE:  Anterior cervical discectomy and fusion.  HOSPITAL COURSE:  The patient was admitted as an EMA, went to the operating room, underwent the aforementioned procedure. Postoperatively, the patient did very well, went to recovery room on the floor.  On the floor was stable, ambulating, and voiding spontaneously on postop day #1.  He will be discharged home and scheduled to follow up in approximately 2 weeks.  At the time of discharge, the patient's neurologic pain and numbness had significantly improved.          ______________________________ Donalee Citrin, M.D.     GC/MEDQ  D:  09/19/2011  T:  09/19/2011  Job:  [id number]  Electronically Signed by Donalee Citrin M.D. on 10/02/2011 03:16:19 PM

## 2011-10-11 ENCOUNTER — Ambulatory Visit (AMBULATORY_SURGERY_CENTER): Payer: Medicare Other | Admitting: *Deleted

## 2011-10-11 VITALS — Ht 67.0 in | Wt 194.0 lb

## 2011-10-11 DIAGNOSIS — Z1211 Encounter for screening for malignant neoplasm of colon: Secondary | ICD-10-CM

## 2011-10-11 MED ORDER — PEG-KCL-NACL-NASULF-NA ASC-C 100 G PO SOLR: ORAL | Status: DC

## 2011-10-14 ENCOUNTER — Encounter: Payer: Self-pay | Admitting: Internal Medicine

## 2011-10-18 ENCOUNTER — Ambulatory Visit (AMBULATORY_SURGERY_CENTER): Payer: Medicare Other | Admitting: Internal Medicine

## 2011-10-18 ENCOUNTER — Encounter: Payer: Self-pay | Admitting: Internal Medicine

## 2011-10-18 VITALS — BP 132/78 | HR 68 | Temp 97.9°F | Resp 19 | Ht 67.0 in | Wt 194.0 lb

## 2011-10-18 DIAGNOSIS — D129 Benign neoplasm of anus and anal canal: Secondary | ICD-10-CM

## 2011-10-18 DIAGNOSIS — D1330 Benign neoplasm of unspecified part of small intestine: Secondary | ICD-10-CM

## 2011-10-18 DIAGNOSIS — Z1211 Encounter for screening for malignant neoplasm of colon: Secondary | ICD-10-CM

## 2011-10-18 DIAGNOSIS — Z860100 Personal history of colon polyps, unspecified: Secondary | ICD-10-CM

## 2011-10-18 DIAGNOSIS — D128 Benign neoplasm of rectum: Secondary | ICD-10-CM

## 2011-10-18 DIAGNOSIS — D126 Benign neoplasm of colon, unspecified: Secondary | ICD-10-CM

## 2011-10-18 MED ORDER — SODIUM CHLORIDE 0.9 % IV SOLN: 500.0000 mL | INTRAVENOUS | Status: DC

## 2011-10-18 NOTE — Patient Instructions (Signed)
Please read the handouts given to you by your recovery room nurse.   You need to come back in 5 years for another colonoscopy.   Resume your routine medications today.    If you have any questions, please call us at [phone].  Thank-you for choosing [place] today for your medical needs.

## 2011-10-21 ENCOUNTER — Telehealth: Payer: Self-pay | Admitting: *Deleted

## 2011-10-21 NOTE — Telephone Encounter (Signed)
Follow up Call- Patient questions:  Do you have a fever, pain , or abdominal swelling? no Pain Score  0 *  Have you tolerated food without any problems? yes  Have you been able to return to your normal activities? yes  Do you have any questions about your discharge instructions: Diet   no Medications  no Follow up visit  no  Do you have questions or concerns about your Care? no  Actions: * If pain score is 4 or above: No action needed, pain <4.  Pt states that he had severe upper chest and neck pain on Saturday morning but now it has "dissipated" Pt states that he is having no problems now. Educated pt to call back if any questions or concerns arose.

## 2011-10-31 ENCOUNTER — Other Ambulatory Visit: Payer: Medicare Other | Admitting: Gastroenterology

## 2011-11-12 ENCOUNTER — Encounter: Payer: Self-pay | Admitting: Internal Medicine

## 2011-11-12 ENCOUNTER — Ambulatory Visit (INDEPENDENT_AMBULATORY_CARE_PROVIDER_SITE_OTHER): Payer: Medicare Other | Admitting: Internal Medicine

## 2011-11-12 DIAGNOSIS — R0609 Other forms of dyspnea: Secondary | ICD-10-CM

## 2011-11-12 DIAGNOSIS — I10 Essential (primary) hypertension: Secondary | ICD-10-CM

## 2011-11-12 DIAGNOSIS — E785 Hyperlipidemia, unspecified: Secondary | ICD-10-CM

## 2011-11-12 DIAGNOSIS — R079 Chest pain, unspecified: Secondary | ICD-10-CM

## 2011-11-12 DIAGNOSIS — R0989 Other specified symptoms and signs involving the circulatory and respiratory systems: Secondary | ICD-10-CM

## 2011-11-12 DIAGNOSIS — Z23 Encounter for immunization: Secondary | ICD-10-CM

## 2011-11-12 NOTE — Assessment & Plan Note (Signed)
See last FLP, re do labs

## 2011-11-12 NOTE — Progress Notes (Signed)
  Subjective:    Patient ID: [name], male    DOB: Jul 25, 1941, 70 y.o.   MRN: [id number]  HPI Routine office visit, several concerns Echocardiogram showed LVH, patient wonders if the LVH is ischemic or not,  has several questions.  Has been quite inactive for the last few months d/t neck surgery, has noted his  stamina to be low, has difficulty breathing when he tries to do more than what he's doing (currently able to walk 10 or 15 minutes without problems and able to play golf). Wonders how he can get his stamina back.  Also has chest pain for 6 or 7 months, very rarely, unable to tell how often. Episodes are left-sided, nor radiation, last 5 minutes, w/ or without exertion, no associated diaphoresis, nausea or vomiting. Also continue to experience some difficulty swallowing since he had the cervical fusion.  Past Medical History  Diagnosis Date  . GERD (gastroesophageal reflux disease)   . Hypertension   . RLS (restless legs syndrome)   . Back pain   . Spondylosis, cervical   . Sleep apnea     chronic, CPAP intolerant  . Decreased hearing   . Pneumonia     recurrent, bronchoscopy 2005: chronic organized pneumonia, Dr. Delford Field   . Alzheimer disease 2002    Dr.Ferrarou- sx resolved   . Depression   . Osteopenia   . Blood transfusion   . Cataract   . Hyperlipidemia   . Osteoporosis   . Adenomatous colon polyp   . Enlarged heart    Past Surgical History  Procedure Date  . Tonsillectomy   . Uvoloplasty 2004  . Nissen fundoplication 12/2009  . Back fusion 09/17/10    L 3-4-5, Dr.Cram  . Cervical facets 2012  . Anterior cervical diskectomy with fusion 2012    c-5  . Bunionectomy 2007    left  . Bone graft/oral 2007  . Colonoscopy   . Cervical spine surgery July 2012    Dr Wynetta Emery, anterior aproach, fusion     Review of Systems He checks his BP frequently, BP usually 121/70, very rarely goes to 140. Medications reviewed, good compliance    Objective:   Physical  Exam  Constitutional: He is oriented to person, place, and time. He appears well-developed and well-nourished. No distress.  Cardiovascular: Normal rate, regular rhythm and normal heart sounds.   No murmur heard. Pulmonary/Chest: Effort normal and breath sounds normal. No respiratory distress. He has no wheezes. He has no rales.  Musculoskeletal: He exhibits no edema.  Neurological: He is alert and oriented to person, place, and time.  Skin: He is not diaphoretic.  Psychiatric: He has a normal mood and affect. His behavior is normal. Judgment and thought content normal.      Assessment & Plan:  Several Issues, F2F > 25 min due to chart review and answering multiple questions:  ECHO report reviewed, LVH not likely ischemic, BP currently under excellent control CP: sx are atypical, stress ECHO neg 5-12, rec observation. Will call if symptoms increase DOE: likely deconditioning, gradual return to a more active life style , discussed referral to  PT (declined).  Will check basic labs (last Creat and Hg slt low)

## 2011-11-12 NOTE — Assessment & Plan Note (Signed)
Well controlled 

## 2011-11-12 NOTE — Patient Instructions (Signed)
Please came back fasting: CBC BMP-- dx HTN FLP--- dx hyperlipidemia

## 2011-11-13 ENCOUNTER — Other Ambulatory Visit: Payer: Self-pay | Admitting: Internal Medicine

## 2011-11-13 DIAGNOSIS — E785 Hyperlipidemia, unspecified: Secondary | ICD-10-CM

## 2011-11-13 DIAGNOSIS — I10 Essential (primary) hypertension: Secondary | ICD-10-CM

## 2011-11-15 ENCOUNTER — Ambulatory Visit: Payer: Medicare Other | Admitting: Internal Medicine

## 2011-11-15 ENCOUNTER — Other Ambulatory Visit (INDEPENDENT_AMBULATORY_CARE_PROVIDER_SITE_OTHER): Payer: Medicare Other

## 2011-11-15 DIAGNOSIS — E785 Hyperlipidemia, unspecified: Secondary | ICD-10-CM

## 2011-11-15 DIAGNOSIS — I10 Essential (primary) hypertension: Secondary | ICD-10-CM

## 2011-11-15 LAB — CBC WITH DIFFERENTIAL/PLATELET
Basophils Absolute: 0.1 10*3/uL (ref 0.0–0.1)
Eosinophils Absolute: 0.3 10*3/uL (ref 0.0–0.7)
HCT: 45.3 % (ref 39.0–52.0)
Hemoglobin: 15.2 g/dL (ref 13.0–17.0)
Lymphs Abs: 1.6 10*3/uL (ref 0.7–4.0)
MCHC: 33.5 g/dL (ref 30.0–36.0)
MCV: 93.7 fl (ref 78.0–100.0)
Monocytes Absolute: 0.6 10*3/uL (ref 0.1–1.0)
Neutro Abs: 4.6 10*3/uL (ref 1.4–7.7)
Platelets: 235 10*3/uL (ref 150.0–400.0)
RDW: 13.2 % (ref 11.5–14.6)

## 2011-11-15 LAB — BASIC METABOLIC PANEL
Calcium: 9.9 mg/dL (ref 8.4–10.5)
Creatinine, Ser: 1 mg/dL (ref 0.4–1.5)
GFR: 82.19 mL/min (ref >60.00)
Sodium: 142 mEq/L (ref 135–145)

## 2011-11-15 LAB — LIPID PANEL
HDL: 43.1 mg/dL (ref >39.00)
Triglycerides: 226 mg/dL — ABNORMAL HIGH (ref 0.0–149.0)

## 2011-11-15 LAB — LDL CHOLESTEROL, DIRECT: Direct LDL: 148.9 mg/dL

## 2011-11-15 NOTE — Progress Notes (Signed)
12  

## 2011-11-21 ENCOUNTER — Other Ambulatory Visit: Payer: Self-pay | Admitting: Neurosurgery

## 2011-11-21 ENCOUNTER — Ambulatory Visit
Admission: RE | Admit: 2011-11-21 | Discharge: 2011-11-21 | Disposition: A | Discharge status: Home / Self Care | Payer: Medicare Other | Source: Ambulatory Visit | Attending: Neurosurgery | Admitting: Neurosurgery

## 2011-11-21 ENCOUNTER — Telehealth: Payer: Self-pay

## 2011-11-21 ENCOUNTER — Other Ambulatory Visit (HOSPITAL_COMMUNITY): Payer: Self-pay | Admitting: *Deleted

## 2011-11-21 DIAGNOSIS — M542 Cervicalgia: Secondary | ICD-10-CM

## 2011-11-21 NOTE — Telephone Encounter (Signed)
Message copied by Beverely Low on Thu Nov 21, 2011  4:44 PM ------      Message from: [name]      Created: Sun Nov 17, 2011  1:02 PM       Advise patient:      Cholesterol slt better than before but LDL still elevated, ideal less than 130 due to FH of CAD (brother w/ early CAD)      Recommend:      lipitor 10 mg 1 po qhs, #30, 6 RF      FLP AST ALT in 6 weeks      Other labs wnl
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
## 2011-11-27 ENCOUNTER — Ambulatory Visit (HOSPITAL_COMMUNITY)
Admission: RE | Admit: 2011-11-27 | Discharge: 2011-11-27 | Disposition: A | Discharge status: Home / Self Care | Payer: Medicare Other | Source: Ambulatory Visit | Attending: Neurosurgery | Admitting: Neurosurgery

## 2011-11-27 DIAGNOSIS — R1310 Dysphagia, unspecified: Secondary | ICD-10-CM | POA: Insufficient documentation

## 2011-11-27 NOTE — Procedures (Signed)
Modified Barium Swallow Procedure Note Patient Details  Name: [name] MRN: [id number] Date of Birth: 10-27-1941  Today's Date: 11/27/2011 Time:  -     Past Medical History:  Past Medical History  Diagnosis Date  . GERD (gastroesophageal reflux disease)   . Hypertension   . RLS (restless legs syndrome)   . Back pain   . Spondylosis, cervical   . Sleep apnea     chronic, CPAP intolerant  . Decreased hearing   . Pneumonia     recurrent, bronchoscopy 2005: chronic organized pneumonia, Dr. Delford Field   . Alzheimer disease 2002    Dr.Ferrarou- sx resolved   . Depression   . Osteopenia   . Blood transfusion   . Cataract   . Hyperlipidemia   . Osteoporosis   . Adenomatous colon polyp   . Enlarged heart    Past Surgical History:  Past Surgical History  Procedure Date  . Tonsillectomy   . Uvoloplasty 2004  . Nissen fundoplication 12/2009  . Back fusion 09/17/10    L 3-4-5, Dr.Cram  . Cervical facets 2012  . Anterior cervical diskectomy with fusion 2012    c-5  . Bunionectomy 2007    left  . Bone graft/oral 2007  . Colonoscopy   . Cervical spine surgery July 2012    Dr Wynetta Emery, anterior aproach, fusion   HPI:    Recommendation/Prognosis  Clinical Impression Dysphagia Diagnosis: Suspected primary esophageal dysphagia Clinical impression: Pt presents with suspected primary esophageal dysphagia characterized by a cricopharyngeal bar and cervical hardware (partcularly an anterior protruding screw at C6) resulting in midly sluggish clearance of solid bolus through the cervical esophagus. Bolus consistently cleared wtih extra time. Oropharyngeal swallow WFL with no aspiration or penetration observed.   Pt judged appropriate to continue current diet of regular solids with thin liquids using compensatory strategies to facilitate esophageal clearance. Defer further follow up including potential re-evaluation of cervical hardware to MD.  Recommendations Solid Consistency:  Regular Liquid Consistency: Thin Liquid Administration via: Cup;Straw Medication Administration: Whole meds with liquid Compensations: Small sips/bites;Follow solids with liquid Postural Changes and/or Swallow Maneuvers: Upright 30-60 min after meal;Seated upright 90 degrees   Individuals Consulted Consulted and Agree with Results and Recommendations: Patient Report Sent to : Referring physician  Chyrel Masson, Speech Pathology Student 11/27/2011

## 2011-12-05 ENCOUNTER — Encounter (HOSPITAL_COMMUNITY): Payer: Self-pay

## 2011-12-09 ENCOUNTER — Encounter (HOSPITAL_COMMUNITY): Payer: Self-pay

## 2011-12-09 ENCOUNTER — Encounter (HOSPITAL_COMMUNITY)
Admission: RE | Admit: 2011-12-09 | Discharge: 2011-12-09 | Disposition: A | Discharge status: Home / Self Care | Payer: Medicare Other | Source: Ambulatory Visit | Attending: Anesthesiology | Admitting: Anesthesiology

## 2011-12-09 ENCOUNTER — Encounter (HOSPITAL_COMMUNITY)
Admission: RE | Admit: 2011-12-09 | Discharge: 2011-12-09 | Disposition: A | Discharge status: Home / Self Care | Payer: Medicare Other | Source: Ambulatory Visit | Attending: Neurosurgery | Admitting: Neurosurgery

## 2011-12-09 HISTORY — DX: Headache: R51

## 2011-12-09 HISTORY — DX: Nocturia: R35.1

## 2011-12-09 HISTORY — DX: Reserved for inherently not codable concepts without codable children: IMO0001

## 2011-12-09 HISTORY — DX: Unspecified injury of head, initial encounter: S09.90XA

## 2011-12-09 HISTORY — DX: Personal history of other diseases of the digestive system: Z87.19

## 2011-12-09 HISTORY — DX: Unspecified hearing loss, unspecified ear: H91.90

## 2011-12-09 HISTORY — DX: Herpesviral vesicular dermatitis: B00.1

## 2011-12-09 HISTORY — DX: Acute myocardial infarction, unspecified: I21.9

## 2011-12-09 HISTORY — DX: Other sleep disorders: G47.8

## 2011-12-09 HISTORY — DX: Unspecified osteoarthritis, unspecified site: M19.90

## 2011-12-09 HISTORY — DX: Snoring: R06.83

## 2011-12-09 LAB — BASIC METABOLIC PANEL
Chloride: 103 mEq/L (ref 96–112)
GFR calc Af Amer: >90 mL/min (ref >90)
Potassium: 4.6 mEq/L (ref 3.5–5.1)

## 2011-12-09 LAB — CBC
HCT: 42 % (ref 39.0–52.0)
Hemoglobin: 14.4 g/dL (ref 13.0–17.0)
WBC: 8 10*3/uL (ref 4.0–10.5)

## 2011-12-09 LAB — SURGICAL PCR SCREEN: Staphylococcus aureus: NEGATIVE

## 2011-12-09 NOTE — Progress Notes (Signed)
Medical MD is Dr.Paz and he manages HTN;pt doesn't have a cardiologist  Echo/stress test in epic as well as EKG Never had a heart cath  Enlarged heart muscle

## 2011-12-09 NOTE — Pre-Procedure Instructions (Signed)
20 [name]  12/09/2011   Your procedure is scheduled on:  Wed, Dec 19   Report to [facility] @ [phone] @ 8 AM to find out arrival time  Call this number if you have problems the morning of surgery: [phone]   Remember:   Do not eat food:After Midnight.  May have clear liquids: up to 4 Hours before arrival.(until 4am)  Clear liquids include soda, tea, black coffee, apple or grape juice, broth.  Take these medicines the morning of surgery with A SIP OF WATER: Micardis,Valtrex,Eye Drops   Do not wear jewelry, make-up or nail polish.  Do not wear lotions, powders, or perfumes. You may wear deodorant.  Do not shave 48 hours prior to surgery.  Do not bring valuables to the hospital.  Contacts, dentures or bridgework may not be worn into surgery.  Leave suitcase in the car. After surgery it may be brought to your room.  For patients admitted to the hospital, checkout time is 11:00 AM the day of discharge.   Patients discharged the day of surgery will not be allowed to drive home.  Name and phone number of your driver:   Special Instructions: CHG Shower Use Special Wash: 1/2 bottle night before surgery and 1/2 bottle morning of surgery.   Please read over the following fact sheets that you were given: Pain Booklet, Coughing and Deep Breathing, MRSA Information and Surgical Site Infection Prevention

## 2011-12-10 ENCOUNTER — Other Ambulatory Visit (HOSPITAL_COMMUNITY): Payer: Self-pay | Admitting: *Deleted

## 2011-12-10 NOTE — Progress Notes (Signed)
Rocephin 1 gm ordered as pre-op antibiotic.  Message left with Erie Noe to see if Dr. Wynetta Emery wants to order a different antibiotic.Pt. Has allergy to penicillin.

## 2011-12-10 NOTE — Progress Notes (Signed)
LEFT MESSAGE ON VANESSA'S LINE  TO REQUEST ORDERS BE PLACED IN EPIC.

## 2011-12-11 ENCOUNTER — Inpatient Hospital Stay (HOSPITAL_COMMUNITY)
Admission: RE | Admit: 2011-12-11 | Discharge: 2011-12-11 | DRG: 497 | Disposition: A | Discharge status: Home / Self Care | Payer: Medicare Other | Source: Ambulatory Visit | Attending: Neurosurgery | Admitting: Neurosurgery

## 2011-12-11 ENCOUNTER — Encounter (HOSPITAL_COMMUNITY): Payer: Self-pay

## 2011-12-11 ENCOUNTER — Ambulatory Visit (HOSPITAL_COMMUNITY): Payer: Medicare Other | Admitting: Certified Registered Nurse Anesthetist

## 2011-12-11 ENCOUNTER — Encounter (HOSPITAL_COMMUNITY)
Admission: RE | Disposition: A | Discharge status: Home / Self Care | Payer: Self-pay | Source: Ambulatory Visit | Attending: Neurosurgery

## 2011-12-11 ENCOUNTER — Encounter (HOSPITAL_COMMUNITY): Payer: Self-pay | Admitting: Certified Registered Nurse Anesthetist

## 2011-12-11 DIAGNOSIS — Z880 Allergy status to penicillin: Secondary | ICD-10-CM

## 2011-12-11 DIAGNOSIS — F329 Major depressive disorder, single episode, unspecified: Secondary | ICD-10-CM | POA: Diagnosis present

## 2011-12-11 DIAGNOSIS — F0280 Dementia in other diseases classified elsewhere without behavioral disturbance: Secondary | ICD-10-CM | POA: Diagnosis present

## 2011-12-11 DIAGNOSIS — G2581 Restless legs syndrome: Secondary | ICD-10-CM | POA: Diagnosis present

## 2011-12-11 DIAGNOSIS — I10 Essential (primary) hypertension: Secondary | ICD-10-CM | POA: Diagnosis present

## 2011-12-11 DIAGNOSIS — G309 Alzheimer's disease, unspecified: Secondary | ICD-10-CM | POA: Diagnosis present

## 2011-12-11 DIAGNOSIS — T84498A Other mechanical complication of other internal orthopedic devices, implants and grafts, initial encounter: Principal | ICD-10-CM | POA: Diagnosis present

## 2011-12-11 DIAGNOSIS — Z87891 Personal history of nicotine dependence: Secondary | ICD-10-CM

## 2011-12-11 DIAGNOSIS — Y831 Surgical operation with implant of artificial internal device as the cause of abnormal reaction of the patient, or of later complication, without mention of misadventure at the time of the procedure: Secondary | ICD-10-CM | POA: Diagnosis present

## 2011-12-11 DIAGNOSIS — Z888 Allergy status to other drugs, medicaments and biological substances status: Secondary | ICD-10-CM

## 2011-12-11 DIAGNOSIS — F3289 Other specified depressive episodes: Secondary | ICD-10-CM | POA: Diagnosis present

## 2011-12-11 DIAGNOSIS — G4730 Sleep apnea, unspecified: Secondary | ICD-10-CM | POA: Diagnosis present

## 2011-12-11 DIAGNOSIS — K219 Gastro-esophageal reflux disease without esophagitis: Secondary | ICD-10-CM | POA: Diagnosis present

## 2011-12-11 DIAGNOSIS — Y92009 Unspecified place in unspecified non-institutional (private) residence as the place of occurrence of the external cause: Secondary | ICD-10-CM

## 2011-12-11 DIAGNOSIS — R1310 Dysphagia, unspecified: Secondary | ICD-10-CM | POA: Diagnosis present

## 2011-12-11 DIAGNOSIS — Z981 Arthrodesis status: Secondary | ICD-10-CM

## 2011-12-11 DIAGNOSIS — E785 Hyperlipidemia, unspecified: Secondary | ICD-10-CM | POA: Diagnosis present

## 2011-12-11 DIAGNOSIS — I252 Old myocardial infarction: Secondary | ICD-10-CM

## 2011-12-11 DIAGNOSIS — H9190 Unspecified hearing loss, unspecified ear: Secondary | ICD-10-CM | POA: Diagnosis present

## 2011-12-11 DIAGNOSIS — M810 Age-related osteoporosis without current pathological fracture: Secondary | ICD-10-CM | POA: Diagnosis present

## 2011-12-11 HISTORY — PX: ANTERIOR CERVICAL DECOMP/DISCECTOMY FUSION: SHX1161

## 2011-12-11 SURGERY — ANTERIOR CERVICAL DECOMPRESSION/DISCECTOMY FUSION 1 LEVEL/HARDWARE REMOVAL
Anesthesia: General | Site: Neck | Laterality: Bilateral | Wound class: Clean

## 2011-12-11 MED ORDER — 0.9 % SODIUM CHLORIDE (POUR BTL) OPTIME
TOPICAL | Status: DC | PRN
  Administered 2011-12-11: 1000 mL

## 2011-12-11 MED ORDER — SODIUM CHLORIDE 0.9 % IJ SOLN
3.0000 mL | Freq: Two times a day (BID) | INTRAMUSCULAR | Status: DC
  Administered 2011-12-11: 3 mL via INTRAVENOUS

## 2011-12-11 MED ORDER — MORPHINE SULFATE 2 MG/ML IJ SOLN: 0.0500 mg/kg | INTRAMUSCULAR | Status: DC | PRN

## 2011-12-11 MED ORDER — HYDROCODONE-ACETAMINOPHEN 5-325 MG PO TABS: 1.0000 | ORAL_TABLET | ORAL | Status: DC | PRN

## 2011-12-11 MED ORDER — OLOPATADINE HCL 0.2 % OP SOLN: 1.0000 [drp] | Freq: Every day | OPHTHALMIC | Status: DC

## 2011-12-11 MED ORDER — SODIUM CHLORIDE 0.9 % IR SOLN
Status: DC | PRN
  Administered 2011-12-11: 10:00:00

## 2011-12-11 MED ORDER — LACTATED RINGERS IV SOLN
INTRAVENOUS | Status: DC | PRN
  Administered 2011-12-11 (×2): via INTRAVENOUS

## 2011-12-11 MED ORDER — ONDANSETRON HCL 4 MG/2ML IJ SOLN: 4.0000 mg | INTRAMUSCULAR | Status: DC | PRN

## 2011-12-11 MED ORDER — MIDAZOLAM HCL 5 MG/5ML IJ SOLN
INTRAMUSCULAR | Status: DC | PRN
  Administered 2011-12-11: 2 mg via INTRAVENOUS

## 2011-12-11 MED ORDER — PHENOL 1.4 % MT LIQD: 1.0000 | OROMUCOSAL | Status: DC | PRN

## 2011-12-11 MED ORDER — OLOPATADINE HCL 0.1 % OP SOLN
1.0000 [drp] | Freq: Every day | OPHTHALMIC | Status: DC
  Filled 2011-12-11: qty 5

## 2011-12-11 MED ORDER — HYDROCHLOROTHIAZIDE 25 MG PO TABS
25.0000 mg | ORAL_TABLET | Freq: Every day | ORAL | Status: DC
  Administered 2011-12-11: 25 mg via ORAL
  Filled 2011-12-11: qty 1

## 2011-12-11 MED ORDER — NEOSTIGMINE METHYLSULFATE 1 MG/ML IJ SOLN
INTRAMUSCULAR | Status: DC | PRN
  Administered 2011-12-11: 4 mg via INTRAVENOUS

## 2011-12-11 MED ORDER — SODIUM CHLORIDE 0.9 % IV SOLN
INTRAVENOUS | Status: AC
  Filled 2011-12-11: qty 500

## 2011-12-11 MED ORDER — ACETAMINOPHEN 650 MG RE SUPP
650.0000 mg | RECTAL | Status: DC | PRN
Start: 2011-12-11 — End: 2011-12-11

## 2011-12-11 MED ORDER — HYDROMORPHONE HCL PF 1 MG/ML IJ SOLN: 0.5000 mg | INTRAMUSCULAR | Status: DC | PRN

## 2011-12-11 MED ORDER — MEPERIDINE HCL 25 MG/ML IJ SOLN
6.2500 mg | INTRAMUSCULAR | Status: DC | PRN
Start: 2011-12-11 — End: 2011-12-11

## 2011-12-11 MED ORDER — BACITRACIN 50000 UNITS IM SOLR
INTRAMUSCULAR | Status: AC
  Filled 2011-12-11: qty 50000

## 2011-12-11 MED ORDER — DEXAMETHASONE SODIUM PHOSPHATE 10 MG/ML IJ SOLN
INTRAMUSCULAR | Status: AC
  Filled 2011-12-11: qty 1

## 2011-12-11 MED ORDER — DEXTROSE 5 % IV SOLN
1.0000 g | Freq: Once | INTRAVENOUS | Status: AC
  Administered 2011-12-11: 1 g via INTRAVENOUS
  Filled 2011-12-11 (×3): qty 10

## 2011-12-11 MED ORDER — GLYCOPYRROLATE 0.2 MG/ML IJ SOLN
INTRAMUSCULAR | Status: DC | PRN
  Administered 2011-12-11: .8 mg via INTRAVENOUS

## 2011-12-11 MED ORDER — ACETAMINOPHEN 500 MG PO TABS
500.0000 mg | ORAL_TABLET | Freq: Four times a day (QID) | ORAL | Status: DC | PRN
  Administered 2011-12-11: 500 mg via ORAL
  Filled 2011-12-11: qty 1

## 2011-12-11 MED ORDER — HYDROMORPHONE HCL PF 1 MG/ML IJ SOLN: 0.2500 mg | INTRAMUSCULAR | Status: DC | PRN

## 2011-12-11 MED ORDER — FENTANYL CITRATE 0.05 MG/ML IJ SOLN
INTRAMUSCULAR | Status: DC | PRN
  Administered 2011-12-11 (×2): 50 ug via INTRAVENOUS
  Administered 2011-12-11: 100 ug via INTRAVENOUS

## 2011-12-11 MED ORDER — ONDANSETRON HCL 4 MG/2ML IJ SOLN: 4.0000 mg | Freq: Once | INTRAMUSCULAR | Status: DC | PRN

## 2011-12-11 MED ORDER — ROCURONIUM BROMIDE 100 MG/10ML IV SOLN
INTRAVENOUS | Status: DC | PRN
  Administered 2011-12-11: 30 mg via INTRAVENOUS

## 2011-12-11 MED ORDER — MENTHOL 3 MG MT LOZG: 1.0000 | LOZENGE | OROMUCOSAL | Status: DC | PRN

## 2011-12-11 MED ORDER — ACETAMINOPHEN 325 MG PO TABS: 650.0000 mg | ORAL_TABLET | ORAL | Status: DC | PRN

## 2011-12-11 MED ORDER — CEFAZOLIN SODIUM 1-5 GM-% IV SOLN
1.0000 g | Freq: Three times a day (TID) | INTRAVENOUS | Status: DC
  Administered 2011-12-11: 1 g via INTRAVENOUS
  Filled 2011-12-11 (×2): qty 50

## 2011-12-11 MED ORDER — ONDANSETRON HCL 4 MG/2ML IJ SOLN
INTRAMUSCULAR | Status: DC | PRN
  Administered 2011-12-11: 4 mg via INTRAVENOUS

## 2011-12-11 MED ORDER — OLMESARTAN 10 MG HALF TABLET
10.0000 mg | ORAL_TABLET | Freq: Every day | ORAL | Status: DC
  Filled 2011-12-11: qty 1

## 2011-12-11 MED ORDER — EPHEDRINE SULFATE 50 MG/ML IJ SOLN
INTRAMUSCULAR | Status: DC | PRN
  Administered 2011-12-11: 5 mg via INTRAVENOUS

## 2011-12-11 MED ORDER — PROPOFOL 10 MG/ML IV EMUL
INTRAVENOUS | Status: DC | PRN
  Administered 2011-12-11: 150 mg via INTRAVENOUS

## 2011-12-11 MED ORDER — DEXAMETHASONE SODIUM PHOSPHATE 10 MG/ML IJ SOLN
10.0000 mg | Freq: Once | INTRAMUSCULAR | Status: AC
  Administered 2011-12-11: 10 mg via INTRAVENOUS
  Filled 2011-12-11: qty 1

## 2011-12-11 SURGICAL SUPPLY — 72 items
ADH SKN CLS APL DERMABOND .7 (GAUZE/BANDAGES/DRESSINGS) ×1
APL SKNCLS STERI-STRIP NONHPOA (GAUZE/BANDAGES/DRESSINGS) ×1
BAG DECANTER FOR FLEXI CONT (MISCELLANEOUS) ×2 IMPLANT
BENZOIN TINCTURE PRP APPL 2/3 (GAUZE/BANDAGES/DRESSINGS) ×2 IMPLANT
BRUSH SCRUB EZ PLAIN DRY (MISCELLANEOUS) ×2 IMPLANT
BUR MATCHSTICK NEURO 3.0 LAGG (BURR) ×1 IMPLANT
CANISTER SUCTION 2500CC (MISCELLANEOUS) ×2 IMPLANT
CLOSURE STERI STRIP 1/2 X4 (GAUZE/BANDAGES/DRESSINGS) ×1 IMPLANT
CLOTH BEACON ORANGE TIMEOUT ST (SAFETY) ×2 IMPLANT
CONT SPEC 4OZ CLIKSEAL STRL BL (MISCELLANEOUS) ×1 IMPLANT
DECANTER SPIKE VIAL GLASS SM (MISCELLANEOUS) ×2 IMPLANT
DERMABOND ADVANCED (GAUZE/BANDAGES/DRESSINGS) ×1
DERMABOND ADVANCED .7 DNX12 (GAUZE/BANDAGES/DRESSINGS) IMPLANT
DRAPE C-ARM 42X72 X-RAY (DRAPES) ×2 IMPLANT
DRAPE LAPAROTOMY 100X72 PEDS (DRAPES) ×2 IMPLANT
DRAPE MICROSCOPE ZEISS OPMI (DRAPES) ×1 IMPLANT
DRAPE POUCH INSTRU U-SHP 10X18 (DRAPES) ×2 IMPLANT
DRSG OPSITE 4X5.5 SM (GAUZE/BANDAGES/DRESSINGS) ×2 IMPLANT
ELECT COATED BLADE 2.86 ST (ELECTRODE) ×2 IMPLANT
ELECT REM PT RETURN 9FT ADLT (ELECTROSURGICAL) ×2
ELECTRODE REM PT RTRN 9FT ADLT (ELECTROSURGICAL) ×1 IMPLANT
GAUZE SPONGE 4X4 12PLY STRL LF (GAUZE/BANDAGES/DRESSINGS) ×1 IMPLANT
GAUZE SPONGE 4X4 16PLY XRAY LF (GAUZE/BANDAGES/DRESSINGS) IMPLANT
GLOVE BIO SURGEON STRL SZ 6.5 (GLOVE) IMPLANT
GLOVE BIO SURGEON STRL SZ7 (GLOVE) IMPLANT
GLOVE BIO SURGEON STRL SZ7.5 (GLOVE) IMPLANT
GLOVE BIO SURGEON STRL SZ8 (GLOVE) ×2 IMPLANT
GLOVE BIO SURGEON STRL SZ8.5 (GLOVE) IMPLANT
GLOVE BIOGEL M 8.0 STRL (GLOVE) IMPLANT
GLOVE ECLIPSE 6.5 STRL STRAW (GLOVE) IMPLANT
GLOVE ECLIPSE 7.0 STRL STRAW (GLOVE) IMPLANT
GLOVE ECLIPSE 7.5 STRL STRAW (GLOVE) IMPLANT
GLOVE ECLIPSE 8.0 STRL XLNG CF (GLOVE) IMPLANT
GLOVE ECLIPSE 8.5 STRL (GLOVE) IMPLANT
GLOVE EXAM NITRILE LRG STRL (GLOVE) IMPLANT
GLOVE EXAM NITRILE MD LF STRL (GLOVE) IMPLANT
GLOVE EXAM NITRILE XL STR (GLOVE) IMPLANT
GLOVE EXAM NITRILE XS STR PU (GLOVE) IMPLANT
GLOVE INDICATOR 6.5 STRL GRN (GLOVE) IMPLANT
GLOVE INDICATOR 7.0 STRL GRN (GLOVE) IMPLANT
GLOVE INDICATOR 7.5 STRL GRN (GLOVE) IMPLANT
GLOVE INDICATOR 8.0 STRL GRN (GLOVE) IMPLANT
GLOVE INDICATOR 8.5 STRL (GLOVE) ×3 IMPLANT
GLOVE OPTIFIT SS 8.0 STRL (GLOVE) IMPLANT
GLOVE SURG SS PI 6.5 STRL IVOR (GLOVE) ×2 IMPLANT
GOWN BRE IMP SLV AUR LG STRL (GOWN DISPOSABLE) ×2 IMPLANT
GOWN BRE IMP SLV AUR XL STRL (GOWN DISPOSABLE) ×2 IMPLANT
GOWN STRL REIN 2XL LVL4 (GOWN DISPOSABLE) ×2 IMPLANT
HEAD HALTER (SOFTGOODS) ×2 IMPLANT
HEMOSTAT POWDER KIT SURGIFOAM (HEMOSTASIS) IMPLANT
KIT BASIN OR (CUSTOM PROCEDURE TRAY) ×2 IMPLANT
KIT ROOM TURNOVER OR (KITS) ×2 IMPLANT
NDL HYPO 18GX1.5 BLUNT FILL (NEEDLE) ×1 IMPLANT
NDL SPNL 20GX3.5 QUINCKE YW (NEEDLE) ×1 IMPLANT
NEEDLE HYPO 18GX1.5 BLUNT FILL (NEEDLE) ×2 IMPLANT
NEEDLE SPNL 20GX3.5 QUINCKE YW (NEEDLE) IMPLANT
NS IRRIG 1000ML POUR BTL (IV SOLUTION) ×2 IMPLANT
PACK LAMINECTOMY NEURO (CUSTOM PROCEDURE TRAY) ×2 IMPLANT
PAD ARMBOARD 7.5X6 YLW CONV (MISCELLANEOUS) ×6 IMPLANT
RUBBERBAND STERILE (MISCELLANEOUS) ×2 IMPLANT
SPONGE GAUZE 4X4 12PLY (GAUZE/BANDAGES/DRESSINGS) ×2 IMPLANT
SPONGE INTESTINAL PEANUT (DISPOSABLE) ×4 IMPLANT
SPONGE SURGIFOAM ABS GEL SZ50 (HEMOSTASIS) IMPLANT
STRIP CLOSURE SKIN 1/2X4 (GAUZE/BANDAGES/DRESSINGS) ×2 IMPLANT
SUT VIC AB 3-0 SH 8-18 (SUTURE) ×2 IMPLANT
SUT VICRYL 4-0 PS2 18IN ABS (SUTURE) ×2 IMPLANT
SYR 20ML ECCENTRIC (SYRINGE) ×2 IMPLANT
TAPE CLOTH 4X10 WHT NS (GAUZE/BANDAGES/DRESSINGS) IMPLANT
TOWEL OR 17X24 6PK STRL BLUE (TOWEL DISPOSABLE) ×2 IMPLANT
TOWEL OR 17X26 10 PK STRL BLUE (TOWEL DISPOSABLE) ×2 IMPLANT
TRAP SPECIMEN MUCOUS 40CC (MISCELLANEOUS) ×1 IMPLANT
WATER STERILE IRR 1000ML POUR (IV SOLUTION) ×2 IMPLANT

## 2011-12-11 NOTE — Preoperative (Signed)
Beta Blockers   Reason not to administer Beta Blockers:Not Applicable 

## 2011-12-11 NOTE — Transfer of Care (Signed)
Immediate Anesthesia Transfer of Care Note  Patient: [name]  Procedure(s) Performed:  ANTERIOR CERVICAL DECOMPRESSION/DISCECTOMY FUSION 1 LEVEL/HARDWARE REMOVAL - Exploration of cervical fusion with removal of hardware  Patient Location: PACU  Anesthesia Type: General  Level of Consciousness: awake and alert   Airway & Oxygen Therapy: Patient Spontanous Breathing and Patient connected to nasal cannula oxygen  Post-op Assessment: Report given to PACU RN and Post -op Vital signs reviewed and stable  Post vital signs: Reviewed and stable  Complications: No apparent anesthesia complications

## 2011-12-11 NOTE — Anesthesia Postprocedure Evaluation (Signed)
  Anesthesia Post-op Note  Patient: [name]  Procedure(s) Performed:  ANTERIOR CERVICAL DECOMPRESSION/DISCECTOMY FUSION 1 LEVEL/HARDWARE REMOVAL - Exploration of cervical fusion with removal of hardware  Patient Location: PACU  Anesthesia Type: General  Level of Consciousness: sedated  Airway and Oxygen Therapy: Patient Spontanous Breathing and Patient connected to nasal cannula oxygen  Post-op Pain: mild  Post-op Assessment: Post-op Vital signs reviewed, Patient's Cardiovascular Status Stable, Respiratory Function Stable, Patent Airway, No signs of Nausea or vomiting and Pain level controlled  Post-op Vital Signs: Reviewed and stable  Complications: No apparent anesthesia complications

## 2011-12-11 NOTE — Anesthesia Preprocedure Evaluation (Addendum)
Anesthesia Evaluation  Patient identified by MRN, date of birth, ID band Patient awake    Reviewed: Allergy & Precautions, H&P , NPO status , Patient's Chart, lab work & pertinent test results, reviewed documented beta blocker date and time   History of Anesthesia Complications Negative for: history of anesthetic complications  Airway Mallampati: I TM Distance: >3 FB Neck ROM: Full    Dental  (+) Teeth Intact and Dental Advisory Given   Pulmonary shortness of breath and with exertion, sleep apnea (Does not tolerate CPAP) , pneumonia  (Last time in 2007.  No sx now),    Pulmonary exam normal       Cardiovascular hypertension, Pt. on medications Past MI: No hx known to pt. Regular Normal    Neuro/Psych TIA   GI/Hepatic   Endo/Other    Renal/GU      Musculoskeletal  (+) Fibromyalgia -  Abdominal   Peds  Hematology   Anesthesia Other Findings   Reproductive/Obstetrics                       Anesthesia Physical Anesthesia Plan  ASA: II  Anesthesia Plan: General   Post-op Pain Management:    Induction: Intravenous  Airway Management Planned: Oral ETT  Additional Equipment:   Intra-op Plan:   Post-operative Plan: Extubation in OR  Informed Consent: I have reviewed the patients History and Physical, chart, labs and discussed the procedure including the risks, benefits and alternatives for the proposed anesthesia with the patient or authorized representative who has indicated his/her understanding and acceptance.   Dental advisory given  Plan Discussed with: CRNA, Anesthesiologist and Surgeon  Anesthesia Plan Comments:        Anesthesia Quick Evaluation
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
## 2011-12-11 NOTE — Op Note (Signed)
Preoperative diagnosis: Dysphagia from  Displaced C6 screw  Postoperative diagnosis: Same  Procedure: Expiration of hardware for removal of C6 screw  Surgeon Jillyn Hidden Salle Brandle  Anesthesia: Gen.  EBL: Minimal  History of present illness: Patient is 7 or gentleman who previously undergone a three-level into cervical discectomy and fusion from C3-C6 and did very well initially to over last several weeks and months of progressively worsening swallowing difficulty and dysphagia. Workup with a swallowing study revealed obstruction around the level of a C6 screw that had backed out partially and due to patient's persistent symptoms results a swallowing study and imaging findings patient recommend removal of the C7 screws. This meds of the operation perioperative course and expectations of outcome and alternatives surgery were all explained patient's and agreed to proceed forward.  Operative procedure: Patient was brought in the or was induced under general anesthesia and positioned supine the neck in slight extension in 5 pounds of halter traction. His old incision was opened up and scar tissues dissected free and the platysmas divided longitudinally oriented scar tissue the avascular tissue sternomastoid muscle was developed down to the fascia manner prevertebral scarring and after this was anatomy is currently identify the esophagus was reflected medially the carotid laterally the plate was identified and dissected free the displaced C6 screw was immediately identified and removed the other screw this level was also inspected the head popped off of the locking mechanism suicide to remove this screw as well both holes were waxed the remainder the plate seemed to be solid and the remainder of the fusion appeared to be solid as well. There was a copiously are good fixing space was maintained the platysmas reapproximated after Vicryl the skin was closed running 4 subcuticular benzoin Steri-Strips applied patient  recovered in stable condition. At the end of case all needle counts and sponge counts were correct.

## 2011-12-11 NOTE — Plan of Care (Signed)
Problem: Consults Goal: Diagnosis - Spinal Surgery Outcome: Completed/Met Date Met:  12/11/11 Cervical Spine Fusion     

## 2011-12-12 ENCOUNTER — Encounter (HOSPITAL_COMMUNITY): Payer: Self-pay | Admitting: Neurosurgery

## 2011-12-13 NOTE — Discharge Summary (Signed)
Physician Discharge Summary  Patient ID: [name] MRN: [id number] DOB/AGE: [birth year] 70 y.o.  Admit date: 12/11/2011 Discharge date: 12/13/2011  Admission Diagnoses: Dysphagia  Discharge Diagnoses: Same Active Problems:  * No active hospital problems. *    Discharged Condition: good  Hospital Course: Patient was admitted the hospital underwent removal of protruding screws from cervical plate postoperatively to the floor did very well was feeling great by the evening was a be discharged home area  Consults: none  Significant Diagnostic Studies:   Treatments:   Discharge Exam: Blood pressure 159/91, pulse 78, temperature 97.5 F (36.4 C), temperature source Oral, resp. rate 16, SpO2 92.00%. Patient is awake and alert his wound is clean and dry and flat  Disposition: Home or Self Care  Discharge Orders    Future Appointments: Provider: Department: Dept Phone: Center:   03/13/2012 9:00 AM Wanda Plump, MD [facility] [phone] [facility]     Medication List  As of 12/13/2011 12:22 PM   CONTINUE taking these medications         acetaminophen 500 MG tablet   Commonly known as: TYLENOL      CELEBREX 200 MG capsule   Generic drug: celecoxib   TAKE 1 CAPSULE BY MOUTH TWICE DAILY      GLUCOSAMINE 1500 COMPLEX PO      hydrochlorothiazide 25 MG tablet   Commonly known as: HYDRODIURIL      MICARDIS 80 MG tablet   Generic drug: telmisartan      multivitamin per tablet      OVER THE COUNTER MEDICATION      PATADAY 0.2 % Soln   Generic drug: Olopatadine HCl      valACYclovir 1000 MG tablet   Commonly known as: VALTREX           Follow-up Information    Follow up in 1 week.         Signed: Magenta Schmiesing P 12/13/2011, 12:22 PM

## 2012-01-06 DIAGNOSIS — H1045 Other chronic allergic conjunctivitis: Secondary | ICD-10-CM | POA: Diagnosis not present

## 2012-01-06 DIAGNOSIS — H1130 Conjunctival hemorrhage, unspecified eye: Secondary | ICD-10-CM | POA: Diagnosis not present

## 2012-01-28 ENCOUNTER — Other Ambulatory Visit: Payer: Self-pay | Admitting: Neurosurgery

## 2012-01-28 DIAGNOSIS — M542 Cervicalgia: Secondary | ICD-10-CM

## 2012-01-30 ENCOUNTER — Ambulatory Visit
Admission: RE | Admit: 2012-01-30 | Discharge: 2012-01-30 | Disposition: A | Discharge status: Home / Self Care | Payer: Medicare Other | Source: Ambulatory Visit | Attending: Neurosurgery | Admitting: Neurosurgery

## 2012-01-30 DIAGNOSIS — R1310 Dysphagia, unspecified: Secondary | ICD-10-CM | POA: Diagnosis not present

## 2012-01-30 DIAGNOSIS — M47812 Spondylosis without myelopathy or radiculopathy, cervical region: Secondary | ICD-10-CM | POA: Diagnosis not present

## 2012-01-30 DIAGNOSIS — M542 Cervicalgia: Secondary | ICD-10-CM

## 2012-01-30 DIAGNOSIS — M5090 Cervical disc disorder, unspecified, unspecified cervical region: Secondary | ICD-10-CM | POA: Diagnosis not present

## 2012-02-11 ENCOUNTER — Other Ambulatory Visit (HOSPITAL_COMMUNITY): Payer: Self-pay | Admitting: *Deleted

## 2012-02-19 ENCOUNTER — Ambulatory Visit (HOSPITAL_COMMUNITY)
Admission: RE | Admit: 2012-02-19 | Discharge: 2012-02-19 | Disposition: A | Discharge status: Home / Self Care | Payer: Medicare Other | Source: Ambulatory Visit | Attending: Neurosurgery | Admitting: Neurosurgery

## 2012-02-19 DIAGNOSIS — R1319 Other dysphagia: Secondary | ICD-10-CM | POA: Insufficient documentation
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
## 2012-03-04 ENCOUNTER — Encounter: Payer: Self-pay | Admitting: Internal Medicine

## 2012-03-04 ENCOUNTER — Ambulatory Visit (INDEPENDENT_AMBULATORY_CARE_PROVIDER_SITE_OTHER): Payer: Medicare Other | Admitting: Internal Medicine

## 2012-03-04 VITALS — BP 152/74 | HR 88 | Ht 67.0 in | Wt 198.8 lb

## 2012-03-04 DIAGNOSIS — Z86010 Personal history of colonic polyps: Secondary | ICD-10-CM

## 2012-03-04 DIAGNOSIS — R1310 Dysphagia, unspecified: Secondary | ICD-10-CM

## 2012-03-04 DIAGNOSIS — K219 Gastro-esophageal reflux disease without esophagitis: Secondary | ICD-10-CM

## 2012-03-04 DIAGNOSIS — R933 Abnormal findings on diagnostic imaging of other parts of digestive tract: Secondary | ICD-10-CM

## 2012-03-04 NOTE — Patient Instructions (Signed)
You have been scheduled for a Barium Esophogram at [facility] (1st floor of the hospital) on 03-06-12 at 9:30am. Please arrive 15 minutes prior to your appointment for registration. Make certain not to have anything to eat or drink 6 hours prior to your test. If you need to reschedule for any reason, please contact radiology at [phone] to do so.
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
## 2012-03-06 ENCOUNTER — Ambulatory Visit (HOSPITAL_COMMUNITY)
Admission: RE | Admit: 2012-03-06 | Discharge: 2012-03-06 | Disposition: A | Discharge status: Home / Self Care | Payer: Medicare Other | Source: Ambulatory Visit | Attending: Internal Medicine | Admitting: Internal Medicine

## 2012-03-06 DIAGNOSIS — R1310 Dysphagia, unspecified: Secondary | ICD-10-CM | POA: Diagnosis not present

## 2012-03-06 DIAGNOSIS — K224 Dyskinesia of esophagus: Secondary | ICD-10-CM | POA: Diagnosis not present

## 2012-03-06 DIAGNOSIS — R933 Abnormal findings on diagnostic imaging of other parts of digestive tract: Secondary | ICD-10-CM | POA: Diagnosis not present

## 2012-03-10 ENCOUNTER — Ambulatory Visit (AMBULATORY_SURGERY_CENTER): Payer: Medicare Other | Admitting: *Deleted

## 2012-03-10 VITALS — Ht 67.0 in | Wt 193.0 lb

## 2012-03-10 DIAGNOSIS — R1310 Dysphagia, unspecified: Secondary | ICD-10-CM

## 2012-03-10 DIAGNOSIS — R933 Abnormal findings on diagnostic imaging of other parts of digestive tract: Secondary | ICD-10-CM

## 2012-03-10 DIAGNOSIS — K219 Gastro-esophageal reflux disease without esophagitis: Secondary | ICD-10-CM

## 2012-03-13 ENCOUNTER — Ambulatory Visit (INDEPENDENT_AMBULATORY_CARE_PROVIDER_SITE_OTHER): Payer: Medicare Other | Admitting: Internal Medicine

## 2012-03-13 VITALS — BP 142/84 | HR 68 | Temp 98.3°F | Wt 194.0 lb

## 2012-03-13 DIAGNOSIS — E785 Hyperlipidemia, unspecified: Secondary | ICD-10-CM | POA: Diagnosis not present

## 2012-03-13 DIAGNOSIS — I10 Essential (primary) hypertension: Secondary | ICD-10-CM | POA: Diagnosis not present

## 2012-03-13 DIAGNOSIS — L821 Other seborrheic keratosis: Secondary | ICD-10-CM

## 2012-03-13 DIAGNOSIS — M47812 Spondylosis without myelopathy or radiculopathy, cervical region: Secondary | ICD-10-CM | POA: Diagnosis not present

## 2012-03-13 LAB — BASIC METABOLIC PANEL
BUN: 21 mg/dL (ref 6–23)
Calcium: 10 mg/dL (ref 8.4–10.5)
GFR: 68.03 mL/min (ref >60.00)
Glucose, Bld: 94 mg/dL (ref 70–99)
Potassium: 4.3 mEq/L (ref 3.5–5.1)
Sodium: 138 mEq/L (ref 135–145)

## 2012-03-13 LAB — TSH: TSH: 0.78 u[IU]/mL (ref 0.35–5.50)

## 2012-03-13 NOTE — Assessment & Plan Note (Signed)
Skin lesion on the right temple probably seborrheic k, we discussed a dermatology referral, he prefers to wait. We'll refer him when he comes back. In the meantime he will call me if the lesion changes

## 2012-03-13 NOTE — Assessment & Plan Note (Signed)
Ambulatory control good , no change, labs

## 2012-03-13 NOTE — Progress Notes (Signed)
  Subjective:    Patient ID: [name], male    DOB: 05/31/1941, 71 y.o.   MRN: [id number]  HPI Routine office visit, we discussed the following issues: Since the last visit, he had surgery in the neck, having difficulty with gagging and difficulty swallowing since then; to have an EGD in April. Hypertension, ambulatory blood pressure averaged 130/70, pulse in the 60s. Hyperlipidemia, he was recommended Lipitor, however with other medical issues going on he decided not to start any new medications and he is still not ready to try Has new skin lesion in the face, noted a few months ago.   Past Medical History: Hypertension GERD Depression RLS. Musculoskeletal: ---Osteopororsis ---Chronic back pain ---Cervical spondylosis Pulmonary: --- OSA ,CPAP intolerant --- Recurrent pneumonia,bronchoscopy 2005: Chronic organized pneumonia, Dr Delford Field CARD: ---Atypical chest pain, negative stress echo 04/2011, mild LVH. Decrease in hearing COLONOSCOPY: last 11/2006, TICS , no polyp; next 2011/HYPERPLASTIC 2005 Dx w/ Pincus Badder DISEASE (2002 Dr Barb Merino resolved Cataracts  Past Surgical History: Tonsillectomy uvuloplasty 2004 Foot surgery 2006 Nissen Fundoplication 12/2009 back fusion L 3-4-5  09-17-10 DR Talbert [place] surgery July 2012, Dr Wynetta Emery Revised Cspine surgery [phone]   Review of Systems No chest pain or shortness of breath As far as pain control, since his surgery in December 2012, the pain is slightly better, he is going back to more regular activities. He takes Celebrex twice a day, he cannot tell if that is really helping much. Denies any abdominal pain,  vomiting or diarrhea     Objective:   Physical Exam  HENT:  Head:      General -- alert, well-developed, and well-nourished. NAD  Lungs -- normal respiratory effort, no intercostal retractions, no accessory muscle use, and normal breath sounds.   Heart-- normal rate, regular rhythm, no murmur, and no  gallop.   Extremities-- no pretibial edema bilaterally      Assessment & Plan:

## 2012-03-13 NOTE — Assessment & Plan Note (Addendum)
Was recommended Lipitor based on the last cholesterol panel, decided not to  start a new medication given other medical issues; reassess on RTC

## 2012-03-13 NOTE — Assessment & Plan Note (Addendum)
Pain improving slightly, having issues with gagging and swallowing since the surgery in the neck 12 2012 according to the patient. F/u by GI As far as  pain control, Celebrex twice a day does not seem to make a difference thus recommended to take it only as need

## 2012-03-15 ENCOUNTER — Encounter: Payer: Self-pay | Admitting: Internal Medicine

## 2012-03-16 ENCOUNTER — Encounter: Payer: Self-pay | Admitting: *Deleted

## 2012-04-01 ENCOUNTER — Encounter: Payer: Self-pay | Admitting: Internal Medicine

## 2012-04-01 ENCOUNTER — Ambulatory Visit (AMBULATORY_SURGERY_CENTER): Payer: Medicare Other | Admitting: Internal Medicine

## 2012-04-01 VITALS — BP 141/72 | HR 62 | Temp 98.5°F | Resp 15 | Ht 67.0 in | Wt 193.0 lb

## 2012-04-01 DIAGNOSIS — I10 Essential (primary) hypertension: Secondary | ICD-10-CM | POA: Diagnosis not present

## 2012-04-01 DIAGNOSIS — R933 Abnormal findings on diagnostic imaging of other parts of digestive tract: Secondary | ICD-10-CM

## 2012-04-01 DIAGNOSIS — R1310 Dysphagia, unspecified: Secondary | ICD-10-CM

## 2012-04-01 DIAGNOSIS — J45909 Unspecified asthma, uncomplicated: Secondary | ICD-10-CM | POA: Diagnosis not present

## 2012-04-01 DIAGNOSIS — K222 Esophageal obstruction: Secondary | ICD-10-CM | POA: Diagnosis not present

## 2012-04-01 DIAGNOSIS — F329 Major depressive disorder, single episode, unspecified: Secondary | ICD-10-CM | POA: Diagnosis not present

## 2012-04-01 DIAGNOSIS — I252 Old myocardial infarction: Secondary | ICD-10-CM | POA: Diagnosis not present

## 2012-04-01 MED ORDER — SODIUM CHLORIDE 0.9 % IV SOLN: 500.0000 mL | INTRAVENOUS | Status: DC

## 2012-04-01 NOTE — Progress Notes (Signed)
Patient did not have preoperative order for IV antibiotic SSI prophylaxis. (G8918)  Patient did not experience any of the following events: a burn prior to discharge; a fall within the facility; wrong site/side/patient/procedure/implant event; or a hospital transfer or hospital admission upon discharge from the facility. (G8907)  

## 2012-04-01 NOTE — Patient Instructions (Addendum)
Impressions/Recommendations:  Stricture, dilated (post dilation diet instructions given to patient and care partner)  Please resume your medications as you had been taking them prior to your procedure.  YOU HAD AN ENDOSCOPIC PROCEDURE TODAY AT THE [facility]: Refer to the procedure report that was given to you for any specific questions about what was found during the examination.  If the procedure report does not answer your questions, please call your gastroenterologist to clarify.  If you requested that your care partner not be given the details of your procedure findings, then the procedure report has been included in a sealed envelope for you to review at your convenience later.  YOU SHOULD EXPECT: Some feelings of bloating in the abdomen. Passage of more gas than usual.  Walking can help get rid of the air that was put into your GI tract during the procedure and reduce the bloating. If you had a lower endoscopy (such as a colonoscopy or flexible sigmoidoscopy) you may notice spotting of blood in your stool or on the toilet paper. If you underwent a bowel prep for your procedure, then you may not have a normal bowel movement for a few days.  DIET: NOTHING TO EAT OR DRINK UNTIL 3:55 PM. 3:55 UNTIL 4:55 ONLY CLEAR LIQUIDS. AFTER 4:55 PM SOFT FOODS ONLY UNTIL MORNING. RESUME YOUR REGULAR DIET IN AM.  ACTIVITY: Your care partner should take you home directly after the procedure.  You should plan to take it easy, moving slowly for the rest of the day.  You can resume normal activity the day after the procedure however you should NOT DRIVE or use heavy machinery for 24 hours (because of the sedation medicines used during the test).    SYMPTOMS TO REPORT IMMEDIATELY: A gastroenterologist can be reached at any hour.  During normal business hours, 8:30 AM to 5:00 PM Monday through Friday, call [phone].  After hours and on weekends, please call the GI answering service at [phone] who will take a message and have the physician on call contact you.   Following lower endoscopy (colonoscopy or flexible sigmoidoscopy):  Excessive amounts of blood in the stool  Significant tenderness or worsening of abdominal pains  Swelling of the abdomen that is new, acute  Fever of 100F or higher  Following upper endoscopy (EGD)  Vomiting of blood or coffee ground material  New chest pain or pain under the shoulder blades  Painful or persistently difficult swallowing  New shortness of breath  Fever of 100F or higher  Black, tarry-looking stools  FOLLOW UP: If any biopsies were taken you will be contacted by phone or by letter within the next 1-3 weeks.  Call your gastroenterologist if you have not heard about the biopsies in 3 weeks.  Our staff will call the home number listed on your records the next business day following your procedure to check on you and address any questions or concerns that you may have at that time regarding the information given to you following your procedure. This is a courtesy call and so if there is no answer at the home number and we have not heard from you through the emergency physician on call, we will assume that you have returned to your regular daily activities without incident.  SIGNATURES/CONFIDENTIALITY: You and/or your care partner have signed paperwork which will be entered into your electronic medical record.  These signatures attest to the fact that that the information above on your After Visit Summary has  been reviewed and is understood.  Full responsibility of the confidentiality of this discharge information lies with you and/or your care-partner.  

## 2012-04-01 NOTE — Op Note (Signed)
 [facility] [address] [place], [place]  [address]  ENDOSCOPY PROCEDURE REPORT  PATIENT:  [name], [name]  MR#:  [id number] BIRTHDATE:  [birth year], 70 yrs. old  GENDER:  male  ENDOSCOPIST:  Wilhemina Bonito. Eda Keys, MD Referred by:  Office  PROCEDURE DATE:  04/01/2012 PROCEDURE:  EGD, diagnostic 43235, Maloney Dilation of Esophagus - 91F ASA CLASS:  Class II INDICATIONS:  dysphagia, dilation of esophageal stricture, abnormal imaging  MEDICATIONS:   MAC sedation, administered by CRNA, propofol (Diprivan) 150 mg IV TOPICAL ANESTHETIC:  none  DESCRIPTION OF PROCEDURE:   After the risks benefits and alternatives of the procedure were thoroughly explained, informed consent was obtained.  The LB [id number] endoscope was introduced through the mouth and advanced to the second portion of the duodenum, without limitations.  The instrument was slowly withdrawn as the mucosa was fully examined. <<PROCEDUREIMAGES>>  A mild stricture was found in the distal esophagus.  s/p fundoplication.  Otherwise the examination was normal to D2. Retroflexed views revealed prior fundoplication.    The scope was then withdrawn from the patient and the procedure completed  THERAPY: 52 F MALONEY DILATOR PASSED W/O RESISTANCE OR HEME. TOLERATED WELL.  COMPLICATIONS:  None  ENDOSCOPIC IMPRESSION: 1) Stricture in the distal esophagus - S/P DILATION 2) S/p fundoplication 3) Otherwise normal examination  RECOMMENDATIONS: 1) Clear liquids until 5 PM, then soft foods rest of day. Resume prior diet tomorrow. 2) FOLLOW UP PRN  ______________________________ Wilhemina Bonito. Eda Keys, MD  CC:  Willow Ora, MD; The Patient  n. eSIGNED:   Wilhemina Bonito. Eda Keys at 04/01/2012 03:01 PM  [place], [phone]

## 2012-04-02 ENCOUNTER — Telehealth: Payer: Self-pay | Admitting: *Deleted

## 2012-04-02 NOTE — Telephone Encounter (Signed)
  Follow up Call-  Call back number 04/01/2012 10/18/2011  Post procedure Call Back phone  # [phone]  Permission to leave phone message Yes -     Patient questions:  Do you have a fever, pain , or abdominal swelling? no Pain Score  0 *  Have you tolerated food without any problems? yes  Have you been able to return to your normal activities? yes  Do you have any questions about your discharge instructions: Diet   no Medications  no Follow up visit  no  Do you have questions or concerns about your Care? no  Actions: * If pain score is 4 or above: No action needed, pain <4.

## 2012-04-13 ENCOUNTER — Telehealth: Payer: Self-pay | Admitting: Internal Medicine

## 2012-04-13 ENCOUNTER — Encounter: Payer: Self-pay | Admitting: Nurse Practitioner

## 2012-04-13 ENCOUNTER — Ambulatory Visit (INDEPENDENT_AMBULATORY_CARE_PROVIDER_SITE_OTHER): Payer: Medicare Other | Admitting: Nurse Practitioner

## 2012-04-13 VITALS — BP 128/70 | HR 80 | Ht 67.0 in | Wt 196.2 lb

## 2012-04-13 DIAGNOSIS — R1310 Dysphagia, unspecified: Secondary | ICD-10-CM | POA: Diagnosis not present

## 2012-04-13 NOTE — Patient Instructions (Signed)
We have scheduled the Endoscopy with Dr. Yancey Flemings on 04-21-2012. Directions provided. Upper GI Endoscopy Upper GI endoscopy means using a flexible scope to look at the esophagus, stomach, and upper small bowel. This is done to make a diagnosis in people with heartburn, abdominal pain, or abnormal bleeding. Sometimes an endoscope is needed to remove foreign bodies or food that become stuck in the esophagus; it can also be used to take biopsy samples. For the best results, do not eat or drink for 8 hours before having your upper endoscopy.  To perform the endoscopy, you will probably be sedated and your throat will be numbed with a special spray. The endoscope is then slowly passed down your throat (this will not interfere with your breathing). An endoscopy exam takes 15 to 30 minutes to complete and there is no real pain. Patients rarely remember much about the procedure. The results of the test may take several days if a biopsy or other test is taken.  You may have a sore throat after an endoscopy exam. Serious complications are very rare. Stick to liquids and soft foods until your pain is better. Do not drive a car or operate any dangerous equipment for at least 24 hours after being sedated. SEEK IMMEDIATE MEDICAL CARE IF:   You have severe throat pain.   You have shortness of breath.   You have bleeding problems.   You have a fever.   You have difficulty recovering from your sedation.  Document Released: 01/16/2005 Document Revised: 11/28/2011 Document Reviewed: 12/11/2008 [facility] Patient Information 2012 [place], [place].

## 2012-04-13 NOTE — Telephone Encounter (Signed)
Pt had EGD with Dr. Marina Goodell 04/01/12. States he is having problems even swallowing liquids. Pt scheduled to see Willette Cluster NP today at 2pm. Pt aware of appt date and time.

## 2012-04-13 NOTE — Progress Notes (Signed)
[name] [id number] [birth year]   HISTORY OR PRESENT ILLNESS :  [name] is a 71 y.o. male with multiple medical problems, followed by Dr. Marina Goodell for GERD / peptic stricture/ Nissen fundoplication, and adenomatous colon polyps. Mr. Shuey had an anterior cervical discectomy and fusion July 2012. He subsequently developed swallowing problems. A modified barium swallowing study revealed a protruding screw at C6 resulting in sluggish clearance of solid bolus through the cervical esophagus.  In December 2012 patient underwent operation to remove the protruding screw . Unfortunately, his dysphagia persisted and he was evaluated by Dr. Marina Goodell in mid-March. He underwent EGD on 04/04/12 which was basically normal with the exception of a mild distal esophageal stricture. A 65F Maloney dilator was passed without resistance. Patient is worked in today for complaints of progressive dysphasia. Patient still feels that food, and even liquids, are getting stuck high up in his esophagus.    Current Medications, Allergies, Past Medical History, Past Surgical History, Family History and Social History were reviewed in [employer] record.   PHYSICAL EXAMINATION : General:  Well developed white male in no acute distress Head: Normocephalic and atraumatic Eyes:  sclerae anicteric,conjunctive pink. Ears: decreased auditory acuity Neck: Supple, no masses.  Lungs: Clear throughout to auscultation Heart: Regular rate and rhythm; Abdomen: Soft, nondistended, nontender. No masses or hepatomegaly noted. Normal bowel sounds Rectal: not done Skin: No lesions on visible extremities Neurological: Oriented , grossly nonfocal Cervical Nodes:  No significant cervical adenopathy Psychological:  Alert and cooperative. Normal mood and affect  ASSESSMENT AND PLAN : Dysphasia, no improvement and possibly progressive despite EGD with Maloney dilation on 04/01/12. Patient had only a mild distal  esophageal stricture on EGD and a 65F Maloney dilator passed without resistance. Etiology of ongoing swallow problems not clear. We can repeat EGD and dilate his esophagus with a larger dilator which may or may not be of benefit. The benefits, risks, and potential complications of EGD with dilation were discussed with the patient and he agrees to proceed.

## 2012-04-15 ENCOUNTER — Encounter: Payer: Self-pay | Admitting: Nurse Practitioner

## 2012-04-15 DIAGNOSIS — R1310 Dysphagia, unspecified: Secondary | ICD-10-CM | POA: Insufficient documentation

## 2012-04-15 NOTE — Progress Notes (Signed)
Agree with above assessment and plan. Discussed with extended . Patient well known to me.Not entirely clear to me why he has worsening swallowing problems. We will plan on repeat endoscopy with larger caliber dilation.

## 2012-04-20 ENCOUNTER — Other Ambulatory Visit: Payer: Self-pay | Admitting: Internal Medicine

## 2012-04-20 NOTE — Telephone Encounter (Signed)
Refill done.  

## 2012-04-21 ENCOUNTER — Encounter: Payer: Self-pay | Admitting: Internal Medicine

## 2012-04-21 ENCOUNTER — Ambulatory Visit (AMBULATORY_SURGERY_CENTER): Payer: Medicare Other | Admitting: Internal Medicine

## 2012-04-21 VITALS — BP 148/93 | HR 63 | Temp 96.2°F | Resp 16 | Ht 67.0 in | Wt 193.0 lb

## 2012-04-21 DIAGNOSIS — I10 Essential (primary) hypertension: Secondary | ICD-10-CM | POA: Diagnosis not present

## 2012-04-21 DIAGNOSIS — R1310 Dysphagia, unspecified: Secondary | ICD-10-CM

## 2012-04-21 DIAGNOSIS — Z8673 Personal history of transient ischemic attack (TIA), and cerebral infarction without residual deficits: Secondary | ICD-10-CM | POA: Diagnosis not present

## 2012-04-21 DIAGNOSIS — K222 Esophageal obstruction: Secondary | ICD-10-CM

## 2012-04-21 DIAGNOSIS — K219 Gastro-esophageal reflux disease without esophagitis: Secondary | ICD-10-CM | POA: Diagnosis not present

## 2012-04-21 DIAGNOSIS — G4730 Sleep apnea, unspecified: Secondary | ICD-10-CM | POA: Diagnosis not present

## 2012-04-21 DIAGNOSIS — G8929 Other chronic pain: Secondary | ICD-10-CM | POA: Diagnosis not present

## 2012-04-21 MED ORDER — SODIUM CHLORIDE 0.9 % IV SOLN: 500.0000 mL | INTRAVENOUS | Status: DC

## 2012-04-21 NOTE — Patient Instructions (Signed)
YOU HAD AN ENDOSCOPIC PROCEDURE TODAY AT THE [facility]: Refer to the procedure report that was given to you for any specific questions about what was found during the examination.  If the procedure report does not answer your questions, please call your gastroenterologist to clarify.  If you requested that your care partner not be given the details of your procedure findings, then the procedure report has been included in a sealed envelope for you to review at your convenience later.  YOU SHOULD EXPECT: Some feelings of bloating in the abdomen. Passage of more gas than usual.  Walking can help get rid of the air that was put into your GI tract during the procedure and reduce the bloating. If you had a lower endoscopy (such as a colonoscopy or flexible sigmoidoscopy) you may notice spotting of blood in your stool or on the toilet paper. If you underwent a bowel prep for your procedure, then you may not have a normal bowel movement for a few days.  DIET: Your first meal following the procedure should be a light meal and then it is ok to progress to your normal diet.  A half-sandwich or bowl of soup is an example of a good first meal.  Heavy or fried foods are harder to digest and may make you feel nauseous or bloated.  Likewise meals heavy in dairy and vegetables can cause extra gas to form and this can also increase the bloating.  Drink plenty of fluids but you should avoid alcoholic beverages for 24 hours.  ACTIVITY: Your care partner should take you home directly after the procedure.  You should plan to take it easy, moving slowly for the rest of the day.  You can resume normal activity the day after the procedure however you should NOT DRIVE or use heavy machinery for 24 hours (because of the sedation medicines used during the test).    SYMPTOMS TO REPORT IMMEDIATELY: A gastroenterologist can be reached at any hour.  During normal business hours, 8:30 AM to 5:00 PM Monday through Friday,  call [phone].  After hours and on weekends, please call the GI answering service at [phone] who will take a message and have the physician on call contact you.   Following lower endoscopy (colonoscopy or flexible sigmoidoscopy):  Excessive amounts of blood in the stool  Significant tenderness or worsening of abdominal pains  Swelling of the abdomen that is new, acute  Fever of 100F or higher  Following upper endoscopy (EGD)  Vomiting of blood or coffee ground material  New chest pain or pain under the shoulder blades  Painful or persistently difficult swallowing  New shortness of breath  Fever of 100F or higher  Black, tarry-looking stools  FOLLOW UP: If any biopsies were taken you will be contacted by phone or by letter within the next 1-3 weeks.  Call your gastroenterologist if you have not heard about the biopsies in 3 weeks.  Our staff will call the home number listed on your records the next business day following your procedure to check on you and address any questions or concerns that you may have at that time regarding the information given to you following your procedure. This is a courtesy call and so if there is no answer at the home number and we have not heard from you through the emergency physician on call, we will assume that you have returned to your regular daily activities without incident.  SIGNATURES/CONFIDENTIALITY: You and/or your care   partner have signed paperwork which will be entered into your electronic medical record.  These signatures attest to the fact that that the information above on your After Visit Summary has been reviewed and is understood.  Full responsibility of the confidentiality of this discharge information lies with you and/or your care-partner.  

## 2012-04-21 NOTE — Op Note (Signed)
[facility] [address] [place], [place]  [address]  ENDOSCOPY PROCEDURE REPORT  PATIENT:  Christopher, Mckee  MR#:  [id number] BIRTHDATE:  [birth year], 70 yrs. old  GENDER:  male  ENDOSCOPIST:  Wilhemina Bonito. Eda Keys, MD Referred by:  Office  PROCEDURE DATE:  04/21/2012 PROCEDURE:  EGD, diagnostic 43235, Maloney Dilation of Esophagus - 10F ASA CLASS:  Class II INDICATIONS:  dysphagia  MEDICATIONS:   MAC sedation, administered by CRNA, propofol (Diprivan) 200 mg IV TOPICAL ANESTHETIC:  none  DESCRIPTION OF PROCEDURE:   After the risks benefits and alternatives of the procedure were thoroughly explained, informed consent was obtained.  The [facility] GIF-H180 [id number] endoscope was introduced through the mouth and advanced to the second portion of the duodenum, without limitations.  The instrument was slowly withdrawn as the mucosa was fully examined. <<PROCEDUREIMAGES>>  A large caliber nonobstructive 16mm stricture was found in the distal esophagus.  Otherwise the examination was normal. Retroflexed views revealed prior fundoplication.    The scope was then withdrawn from the patient and the procedure completed. 10F MALONEY DILATOR PASSED W/O RESISTANCE OR HEME. TOLERATED WELL  COMPLICATIONS:  None  ENDOSCOPIC IMPRESSION: 1) Stricture in the distal esophagus - S/P DILATION 2) Otherwise normal examination 3) Prior fundoplication  RECOMMENDATIONS: 1) Clear liquids until 1PM, then soft foods rest of day. Resume prior diet tomorrow. 2) CONSIDER TRIAL OF ANXIOLYTIC IF SYMPTOMS PERSIST POST DILATION   ______________________________ Wilhemina Bonito. Eda Keys, MD  CC:  The Patient; Willow Ora, MD  n. eSIGNED:   Wilhemina Bonito. Eda Keys at 04/21/2012 11:18 AM  Isabelle Course, [phone]

## 2012-04-21 NOTE — Progress Notes (Signed)
Patient did not have preoperative order for IV antibiotic SSI prophylaxis. (G8918)  Patient did not experience any of the following events: a burn prior to discharge; a fall within the facility; wrong site/side/patient/procedure/implant event; or a hospital transfer or hospital admission upon discharge from the facility. (G8907)  

## 2012-04-22 ENCOUNTER — Telehealth: Payer: Self-pay | Admitting: *Deleted

## 2012-04-22 ENCOUNTER — Other Ambulatory Visit: Payer: Self-pay | Admitting: Internal Medicine

## 2012-04-22 NOTE — Telephone Encounter (Signed)
Refill done.  

## 2012-04-22 NOTE — Telephone Encounter (Signed)
  Follow up Call-  Call back number 04/21/2012 04/01/2012 10/18/2011  Post procedure Call Back phone  # [phone]  Permission to leave phone message Yes Yes -     Patient questions:  Do you have a fever, pain , or abdominal swelling? no Pain Score  0 *  Have you tolerated food without any problems? yes  Have you been able to return to your normal activities? yes  Do you have any questions about your discharge instructions: Diet   no Medications  no Follow up visit  no  Do you have questions or concerns about your Care? no  Actions: * If pain score is 4 or above: No action needed, pain <4.

## 2012-05-12 ENCOUNTER — Other Ambulatory Visit: Payer: Self-pay | Admitting: Neurosurgery

## 2012-05-12 DIAGNOSIS — M47816 Spondylosis without myelopathy or radiculopathy, lumbar region: Secondary | ICD-10-CM

## 2012-05-12 DIAGNOSIS — M5030 Other cervical disc degeneration, unspecified cervical region: Secondary | ICD-10-CM | POA: Diagnosis not present

## 2012-05-15 ENCOUNTER — Ambulatory Visit
Admission: RE | Admit: 2012-05-15 | Discharge: 2012-05-15 | Disposition: A | Discharge status: Home / Self Care | Payer: Medicare Other | Source: Ambulatory Visit | Attending: Neurosurgery | Admitting: Neurosurgery

## 2012-05-15 DIAGNOSIS — M47816 Spondylosis without myelopathy or radiculopathy, lumbar region: Secondary | ICD-10-CM

## 2012-05-15 DIAGNOSIS — IMO0002 Reserved for concepts with insufficient information to code with codable children: Secondary | ICD-10-CM | POA: Diagnosis not present

## 2012-05-15 DIAGNOSIS — H01009 Unspecified blepharitis unspecified eye, unspecified eyelid: Secondary | ICD-10-CM | POA: Diagnosis not present

## 2012-05-15 DIAGNOSIS — M5030 Other cervical disc degeneration, unspecified cervical region: Secondary | ICD-10-CM

## 2012-05-15 DIAGNOSIS — M5126 Other intervertebral disc displacement, lumbar region: Secondary | ICD-10-CM | POA: Diagnosis not present

## 2012-05-15 DIAGNOSIS — H1045 Other chronic allergic conjunctivitis: Secondary | ICD-10-CM | POA: Diagnosis not present

## 2012-05-15 DIAGNOSIS — H2510 Age-related nuclear cataract, unspecified eye: Secondary | ICD-10-CM | POA: Diagnosis not present

## 2012-05-15 DIAGNOSIS — R1310 Dysphagia, unspecified: Secondary | ICD-10-CM | POA: Diagnosis not present

## 2012-05-29 DIAGNOSIS — M47812 Spondylosis without myelopathy or radiculopathy, cervical region: Secondary | ICD-10-CM | POA: Diagnosis not present

## 2012-05-29 DIAGNOSIS — M5030 Other cervical disc degeneration, unspecified cervical region: Secondary | ICD-10-CM | POA: Diagnosis not present

## 2012-05-29 DIAGNOSIS — M48061 Spinal stenosis, lumbar region without neurogenic claudication: Secondary | ICD-10-CM | POA: Diagnosis not present

## 2012-07-16 ENCOUNTER — Encounter: Payer: Medicare Other | Admitting: Internal Medicine

## 2012-08-18 DIAGNOSIS — M5030 Other cervical disc degeneration, unspecified cervical region: Secondary | ICD-10-CM | POA: Diagnosis not present

## 2012-08-25 DIAGNOSIS — M5030 Other cervical disc degeneration, unspecified cervical region: Secondary | ICD-10-CM | POA: Diagnosis not present

## 2012-08-27 DIAGNOSIS — M5030 Other cervical disc degeneration, unspecified cervical region: Secondary | ICD-10-CM | POA: Diagnosis not present

## 2012-08-31 DIAGNOSIS — M5030 Other cervical disc degeneration, unspecified cervical region: Secondary | ICD-10-CM | POA: Diagnosis not present

## 2012-09-03 DIAGNOSIS — M5030 Other cervical disc degeneration, unspecified cervical region: Secondary | ICD-10-CM | POA: Diagnosis not present

## 2012-09-08 DIAGNOSIS — M5030 Other cervical disc degeneration, unspecified cervical region: Secondary | ICD-10-CM | POA: Diagnosis not present

## 2012-09-11 DIAGNOSIS — M5030 Other cervical disc degeneration, unspecified cervical region: Secondary | ICD-10-CM | POA: Diagnosis not present

## 2012-09-14 DIAGNOSIS — M5030 Other cervical disc degeneration, unspecified cervical region: Secondary | ICD-10-CM | POA: Diagnosis not present

## 2012-09-18 DIAGNOSIS — M5030 Other cervical disc degeneration, unspecified cervical region: Secondary | ICD-10-CM | POA: Diagnosis not present

## 2012-09-21 ENCOUNTER — Ambulatory Visit (INDEPENDENT_AMBULATORY_CARE_PROVIDER_SITE_OTHER): Payer: Medicare Other | Admitting: Internal Medicine

## 2012-09-21 ENCOUNTER — Encounter: Payer: Self-pay | Admitting: Internal Medicine

## 2012-09-21 VITALS — BP 170/82 | HR 70 | Temp 97.9°F | Wt 193.0 lb

## 2012-09-21 DIAGNOSIS — R42 Dizziness and giddiness: Secondary | ICD-10-CM | POA: Diagnosis not present

## 2012-09-21 DIAGNOSIS — I10 Essential (primary) hypertension: Secondary | ICD-10-CM | POA: Diagnosis not present

## 2012-09-21 DIAGNOSIS — Z23 Encounter for immunization: Secondary | ICD-10-CM

## 2012-09-21 DIAGNOSIS — M5030 Other cervical disc degeneration, unspecified cervical region: Secondary | ICD-10-CM | POA: Diagnosis not present

## 2012-09-21 MED ORDER — CARVEDILOL 12.5 MG PO TABS: 12.5000 mg | ORAL_TABLET | Freq: Two times a day (BID) | ORAL | Status: DC

## 2012-09-21 NOTE — Assessment & Plan Note (Addendum)
Used to be well control before; the patient is on chronic Celebrex thus doubt cox-i are affecting his BP Plan: Continue with losartan HCT, add carvedilol

## 2012-09-21 NOTE — Assessment & Plan Note (Addendum)
3 days history of dizziness, previously he had a workup for a TIA 04-2012: Brain MRI normal, Carotid ultrasound  ",50%" plaque with normal vertebral arteries, echocardiogram essentially normal. Clinically he is hard to say if this is central or peripheral vertigo. Plan: labs Neurology referral (Dr Sandria Manly) ER if sx severe Skip PT x 1 week

## 2012-09-21 NOTE — Patient Instructions (Addendum)
Started a new medication called carvedilol. Drink plenty of fluids Check the  blood pressure 2 or 3 times a week, be sure it is between 110/60 and 140/85. If it is consistently higher or lower, let me know\ If you have severe dizziness, more frequent episodes or if  experiencing any stroke like symptoms (slurred speech, facial numbness, strength deficits ) go to the ER Next visi 4 weeks

## 2012-09-21 NOTE — Progress Notes (Signed)
  Subjective:    Patient ID: [name], male    DOB: 1941-09-21, 71 y.o.   MRN: [id number]  HPI Acute visit Chief complaint today is dizziness.  Went to do physical therapy 3 days ago, developed dizziness, described as spinning "sometimes",  other times it feels more like a imbalance. Symptoms are on and off, last few minutes, and not clearly related to head motion. he had few similar episodes during the weekend and this morning again had the same at time of  physical therapy. His blood pressure lately has been consistently elevated, ~ 190/70 at some point.   Past Medical History: Hypertension GERD Depression RLS. Musculoskeletal: ---Osteopororsis ---Chronic back pain ---Cervical spondylosis Pulmonary: --- OSA ,CPAP intolerant --- Recurrent pneumonia,bronchoscopy 2005: Chronic organized pneumonia, Dr Delford Field CARD: ---Atypical chest pain, negative stress echo 04/2011, mild LVH. Decrease in hearing Dx w/ Pincus Badder DISEASE (2002 Dr Barb Merino resolved Cataracts  Past Surgical History: Tonsillectomy uvuloplasty 2004 Foot surgery 2006 Nissen Fundoplication 12/2009 back fusion L 3-4-5  09-17-10 DR Talbert Forest surgery July 2012, Dr Wynetta Emery Revised Cspine surgery [id number]   Review of Systems Denies headaches, nausea, vomiting. No mental status changes No diarrhea or blood in the stools No visual disturbances, sore speech, motor disturbances. Takes ibuprofen rarely but he does take Celebrex twice a day. Good compliance with medications. Denies SOB to me , no SSCP (few seconds of a sharp R side pain sometimes). LE edema at baseline     Objective:   Physical Exam General -- alert, well-developed, and well-nourished.   Neck -- normal carotid pulse  HEENT -- EOMI, PERRLA Lungs -- normal respiratory effort, no intercostal retractions, no accessory muscle use, and normal breath sounds.   Heart-- normal rate, regular rhythm, no murmur, and no gallop.   Extremities-- no  pretibial edema bilaterally Neurologic-- alert & oriented X3, speech is fluent, memory is normal, motor and face symmetric, gait normal  Psych-- Cognition and judgment appear intact. Alert and cooperative with normal attention span and concentration.  not anxious appearing and not depressed appearing.       Assessment & Plan:

## 2012-09-22 LAB — CBC WITH DIFFERENTIAL/PLATELET
Basophils Relative: 0.9 % (ref 0.0–3.0)
Eosinophils Absolute: 0.3 10*3/uL (ref 0.0–0.7)
Eosinophils Relative: 4.3 % (ref 0.0–5.0)
Hemoglobin: 13.9 g/dL (ref 13.0–17.0)
Lymphocytes Relative: 20.9 % (ref 12.0–46.0)
MCHC: 33 g/dL (ref 30.0–36.0)
Monocytes Relative: 5.7 % (ref 3.0–12.0)
Neutro Abs: 5 10*3/uL (ref 1.4–7.7)
Neutrophils Relative %: 68.2 % (ref 43.0–77.0)
RBC: 4.53 Mil/uL (ref 4.22–5.81)
WBC: 7.4 10*3/uL (ref 4.5–10.5)

## 2012-09-22 LAB — BASIC METABOLIC PANEL
CO2: 29 mEq/L (ref 19–32)
Calcium: 9.2 mg/dL (ref 8.4–10.5)
Creatinine, Ser: 0.9 mg/dL (ref 0.4–1.5)
GFR: 91.85 mL/min (ref >60.00)
Sodium: 139 mEq/L (ref 135–145)

## 2012-09-24 ENCOUNTER — Encounter: Payer: Self-pay | Admitting: *Deleted

## 2012-10-05 DIAGNOSIS — R42 Dizziness and giddiness: Secondary | ICD-10-CM | POA: Diagnosis not present

## 2012-10-06 ENCOUNTER — Other Ambulatory Visit: Payer: Self-pay | Admitting: Diagnostic Neuroimaging

## 2012-10-06 DIAGNOSIS — R42 Dizziness and giddiness: Secondary | ICD-10-CM

## 2012-10-08 ENCOUNTER — Ambulatory Visit (INDEPENDENT_AMBULATORY_CARE_PROVIDER_SITE_OTHER): Payer: Medicare Other | Admitting: Internal Medicine

## 2012-10-08 VITALS — BP 160/88 | HR 51 | Temp 97.9°F | Ht 67.0 in | Wt 194.0 lb

## 2012-10-08 DIAGNOSIS — R42 Dizziness and giddiness: Secondary | ICD-10-CM | POA: Diagnosis not present

## 2012-10-08 DIAGNOSIS — Z0000 Encounter for general adult medical examination without abnormal findings: Secondary | ICD-10-CM

## 2012-10-08 DIAGNOSIS — I10 Essential (primary) hypertension: Secondary | ICD-10-CM

## 2012-10-08 DIAGNOSIS — R7309 Other abnormal glucose: Secondary | ICD-10-CM | POA: Diagnosis not present

## 2012-10-08 DIAGNOSIS — M549 Dorsalgia, unspecified: Secondary | ICD-10-CM | POA: Diagnosis not present

## 2012-10-08 DIAGNOSIS — R739 Hyperglycemia, unspecified: Secondary | ICD-10-CM

## 2012-10-08 DIAGNOSIS — E785 Hyperlipidemia, unspecified: Secondary | ICD-10-CM | POA: Diagnosis not present

## 2012-10-08 LAB — LIPID PANEL
HDL: 38.4 mg/dL — ABNORMAL LOW (ref >39.00)
Total CHOL/HDL Ratio: 5
Triglycerides: 203 mg/dL — ABNORMAL HIGH (ref 0.0–149.0)
VLDL: 40.6 mg/dL — ABNORMAL HIGH (ref 0.0–40.0)

## 2012-10-08 LAB — LDL CHOLESTEROL, DIRECT: Direct LDL: 159 mg/dL

## 2012-10-08 LAB — HEMOGLOBIN A1C: Hgb A1c MFr Bld: 5.8 % (ref 4.6–6.5)

## 2012-10-08 MED ORDER — CLONIDINE HCL 0.1 MG PO TABS: 0.1000 mg | ORAL_TABLET | Freq: Two times a day (BID) | ORAL | Status: DC

## 2012-10-08 NOTE — Patient Instructions (Signed)
Start the new medication for blood pressure called  clonidine , call if side effects. Check the  blood pressure 2 or 3 times a week, be sure it is between 110/60 and 140/85. If it is consistently higher or lower, let me know

## 2012-10-08 NOTE — Assessment & Plan Note (Signed)
Pain at the right lower back and right hip may be related to DJD or a spinal issue. Encouraged to discuss with his back surgeon

## 2012-10-08 NOTE — Assessment & Plan Note (Addendum)
Last BMP normal, needs better BP control. Intolerant to calcium channel blockers. Pulse  in the 50s Options are limited, already on beta blockers, ACE inhibitors and diuretics. Plan: Trial with a low dose of clonidine

## 2012-10-08 NOTE — Progress Notes (Signed)
Subjective:    Patient ID: [name], male    DOB: January 09, 1941, 71 y.o.   MRN: [id number]  HPI Here for Medicare AWV:  1. Risk factors based on Past M, S, F history: reviewed 2. Physical Activities: no active  d/t pain   3. Depression/mood:  Pain is an issue, has dizziness ---> not depressed but concerned about his health 4. Hearing:  Decreased , uses hearing aids, no major problems noted today to normal  conversation 5. ADL's:  Independent   6. Fall Risk: no recent falls, risk increased due to neck pain, counseled about prevention 7. home Safety: does feel safe at home   8. Height, weight, &visual acuity: see VS, sees ophtalmology yearly, doing well 9. Counseling: provided 10. Labs ordered based on risk factors: if needed   11. Referral Coordination: if needed 12.  Care Plan, see assessment and plan   13.   Cognitive Assessment:motor skills limited by neck pain, cognition normal  In addition, today we discussed the following: Dizziness, status post neurology eval, they are prescribing MRIs. They also think that he may need a cardiology evaluation because he has exertional lightheadedness. See assessment and plan. Hypertension, BP was elevated at  the neurologist, good medication compliance, ambulatory blood pressures range from 150/70 to 160/80. Pulse in the 50s. Also complained of pain located at the lower right back with some radiation to the right groin area. Pain is on and off, when present, is made worse by walking and decreased by sitting.   Past Medical History: Hypertension GERD H/o esophageal dilatation GI DR Marina Goodell Depression RLS. Musculoskeletal: ---Osteopororsis ---Chronic back pain ---Cervical spondylosis Pulmonary: --- OSA ,CPAP intolerant --- Recurrent pneumonia,bronchoscopy 2005: Chronic organized pneumonia, Dr Delford Field CARD: ---Atypical chest pain, negative stress echo 04/2011, mild LVH. 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 Heart attack      GP, aunt,brother(had several angoplasties, started in lat 40s, pass away in his last 50s)  . Diabetes      GM  . Colon cancer      uncle- age 9s  . Melanoma      uncle  . Prostate cancer Brother 40  . Pancreatic cancer Brother   . Anesthesia problems Neg Hx   . Hypotension Neg Hx   . Malignant hyperthermia Neg Hx   . Pseudochol deficiency Neg Hx   . Colon cancer Paternal Uncle      Review of Systems Denies abdominal pain, nausea, diarrhea. No dysuria or gross hematuria.     Objective:   Physical Exam  General -- alert, well-developed . No apparent distress.  Neck --no thyromegaly Lungs -- normal respiratory effort, no intercostal retractions, no accessory muscle use, and normal breath sounds.   Heart-- normal rate, regular rhythm, no murmur, and no gallop.   Abdomen--soft, non-tender, no distention, no masses, no HSM, no guarding, and no rigidity.  No inguinal hernias. Extremities-- no pretibial edema bilaterally , normal pedal pulses. pasive rotation of the hips normal without pain or  limitations. DRE-- prostate not enlarged, not tender or nodular Psych-- Cognition and judgment appear intact. Alert and cooperative with normal attention span and concentration.  not anxious or depress appearing  but he seems understandably worried about his pain and dizziness      Assessment & Plan:

## 2012-10-08 NOTE — Assessment & Plan Note (Addendum)
Status post eval by neurology, they suggested that symptoms may be cardiogenic ("exertional dizziness, near syncope"). Chart reviewed, had a echocardiogram and the stress echo 04/2011, tests negative. Plan: We will fax those results to neurology If neurology workup negative, will refer to cardiology

## 2012-10-08 NOTE — Assessment & Plan Note (Addendum)
Tdap 2012 Had a flu shot  pneumonia shot 03-2009 shingles shot -- 2008  Cscope several Cscopes due to polyps, last 12-07, TICS , no polyp Cscope 09-2011 3 polyps, next per GI Unable to exercise as much as he likes due to pain.  Counseling about diet. PSAs stable overtime, DRE normal.

## 2012-10-08 NOTE — Assessment & Plan Note (Signed)
Was recommended Lipitor in the past but decided not to start. Plan: FLP

## 2012-10-10 ENCOUNTER — Encounter: Payer: Self-pay | Admitting: Internal Medicine

## 2012-10-10 ENCOUNTER — Ambulatory Visit
Admission: RE | Admit: 2012-10-10 | Discharge: 2012-10-10 | Disposition: A | Discharge status: Home / Self Care | Payer: Medicare Other | Source: Ambulatory Visit | Attending: Diagnostic Neuroimaging | Admitting: Diagnostic Neuroimaging

## 2012-10-10 DIAGNOSIS — R42 Dizziness and giddiness: Secondary | ICD-10-CM

## 2012-10-10 MED ORDER — GADOBENATE DIMEGLUMINE 529 MG/ML IV SOLN
15.0000 mL | Freq: Once | INTRAVENOUS | Status: AC | PRN
  Administered 2012-10-10: 15 mL via INTRAVENOUS

## 2012-10-13 ENCOUNTER — Encounter: Payer: Self-pay | Admitting: *Deleted

## 2012-10-16 ENCOUNTER — Telehealth: Payer: Self-pay | Admitting: Internal Medicine

## 2012-10-16 DIAGNOSIS — I10 Essential (primary) hypertension: Secondary | ICD-10-CM

## 2012-10-16 NOTE — Telephone Encounter (Signed)
Patient states his blood pressure is 186/65 this morning and was told to call Dr. Drue Novel if he was having problems. Pt would like to know if he needs to change medication/dosage or if he needs an OV. Please advise. Patient can be reached at home.

## 2012-10-16 NOTE — Telephone Encounter (Signed)
Please advise 

## 2012-10-16 NOTE — Telephone Encounter (Signed)
Left detailed msg on vmail explaining instructions. Asked pt to return call on Monday to verify understanding.

## 2012-10-16 NOTE — Telephone Encounter (Signed)
Current clonidine 0.1 mg 1 tablet twice a day New dose: 2 tabs TID Call with BP readings next week

## 2012-10-19 NOTE — Telephone Encounter (Signed)
Spoke with pt he states that since he increased the clonidine he has had severe headaches & his dizzines has started back again. His BP readings have been around 163/70. Pt also states that Dr. Lewie Loron @ [facility] Neu[facility] after doing 3 MRIs found hardening of the arteries & would like for Dr. Drue Novel to refer him to Cardiology. Please advise.

## 2012-10-20 DIAGNOSIS — M5030 Other cervical disc degeneration, unspecified cervical region: Secondary | ICD-10-CM | POA: Diagnosis not present

## 2012-10-20 MED ORDER — HYDRALAZINE HCL 10 MG PO TABS: 10.0000 mg | ORAL_TABLET | Freq: Three times a day (TID) | ORAL | Status: DC

## 2012-10-20 NOTE — Telephone Encounter (Signed)
Spoke with pt, he is upset & would like to have a cardiology referral placed immediately. I explained to the pt that he needed to schedule an appt in 1 month for a follow-up & he stated that he was not waiting that long, I offered him an appt for this week & he said only if that's what he has to do to get a cariology referral done asap. Please advise.

## 2012-10-20 NOTE — Telephone Encounter (Signed)
Discontinue clonidine Start Hydralazine 10 mg one by mouth twice a day, #60 and one refill Call with BP readings in one week. Will discuss cardiology referral when he comes back. Schedule f/u 1 month for BP check

## 2012-10-20 NOTE — Addendum Note (Signed)
Addended by: Willow Ora E on: 10/20/2012 05:26 PM   Modules accepted: Orders

## 2012-10-20 NOTE — Telephone Encounter (Addendum)
Patient still has headaches, dizziness, BP still elevated.  I reviewedthe neurology note and they suggest a cardiology referral. Patient is upset because his BP still elevated "something is not right". Plan: Start hydralazine, patient will discontinue clonidine I entered a cardiology referral for evaluation of exertional presyncope (see neurology note) and elevated BP Hold celebrex

## 2012-10-20 NOTE — Telephone Encounter (Signed)
lmovm for pt to return call.  

## 2012-10-21 ENCOUNTER — Other Ambulatory Visit: Payer: Self-pay | Admitting: Family Medicine

## 2012-10-21 MED ORDER — HYDRALAZINE HCL 10 MG PO TABS: 10.0000 mg | ORAL_TABLET | Freq: Three times a day (TID) | ORAL | Status: DC

## 2012-10-21 NOTE — Telephone Encounter (Signed)
please send new BP meds for at least 30-days worth to [facility], as the one sent yesterday [facility] and [facility] will not get for 12-days cb# [phone]

## 2012-10-21 NOTE — Telephone Encounter (Signed)
Refill done.  

## 2012-11-03 ENCOUNTER — Telehealth: Payer: Self-pay | Admitting: Internal Medicine

## 2012-11-03 NOTE — Telephone Encounter (Signed)
Letter from the patient reviewed: Patient started hydralazine 10/27/2012. he continue with headaches since September, taking Motrin for it but states is not helping. BPs since then: 177/89, 139/75, 165/83, 177/80. Average in the morning 168/86, in the afternoon 166/77. Plan: Increase hydralazine 10 mg from one tablets TID to 2 tablets TID. If he is not improving or his BP is not getting better we will need further eval. (Nephrology?) Stop ibuprofen, it is not helping his headaches, most likely is affecting his BP.

## 2012-11-04 MED ORDER — HYDRALAZINE HCL 10 MG PO TABS: 20.0000 mg | ORAL_TABLET | Freq: Three times a day (TID) | ORAL | Status: DC

## 2012-11-04 NOTE — Telephone Encounter (Signed)
Discussed with pt. Sent in rx. Pt stated that he wanted to let Dr. Drue Novel know that he has an appt with Cardiology on 11.20.13.

## 2012-11-11 ENCOUNTER — Ambulatory Visit (INDEPENDENT_AMBULATORY_CARE_PROVIDER_SITE_OTHER): Payer: Medicare Other | Admitting: Cardiovascular Disease

## 2012-11-11 ENCOUNTER — Encounter: Payer: Self-pay | Admitting: Cardiovascular Disease

## 2012-11-11 ENCOUNTER — Institutional Professional Consult (permissible substitution): Payer: Medicare Other | Admitting: Cardiovascular Disease

## 2012-11-11 VITALS — BP 155/70 | HR 66 | Wt 195.0 lb

## 2012-11-11 DIAGNOSIS — E785 Hyperlipidemia, unspecified: Secondary | ICD-10-CM

## 2012-11-11 DIAGNOSIS — I10 Essential (primary) hypertension: Secondary | ICD-10-CM | POA: Diagnosis not present

## 2012-11-11 DIAGNOSIS — R42 Dizziness and giddiness: Secondary | ICD-10-CM | POA: Diagnosis not present

## 2012-11-11 NOTE — Assessment & Plan Note (Signed)
Per Dr Sherlyn Lick start lipitor and f/u labs in 3 months LDL 159

## 2012-11-11 NOTE — Assessment & Plan Note (Signed)
Labile F/U renal duplex Dr Drue Novel to adjust meds.  No obvious secondary causes

## 2012-11-11 NOTE — Patient Instructions (Addendum)
Your physician recommends that you schedule a follow-up appointment in: AS NEEDED Your physician recommends that you continue on your current medications as directed. Please refer to the Current Medication list given to you today. Your physician has requested that you have a renal artery duplex. During this test, an ultrasound is used to evaluate blood flow to the kidneys. Allow one hour for this exam. Do not eat after midnight the day before and avoid carbonated beverages. Take your medications as you usually do. DX 401.1
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
## 2012-11-11 NOTE — Assessment & Plan Note (Signed)
Normal ECG and exam no chest pain He indicates Normal stress echo 5/12 with hypertensive response to exercise  No indication for repeat

## 2012-11-16 ENCOUNTER — Other Ambulatory Visit: Payer: Self-pay | Admitting: Internal Medicine

## 2012-11-16 NOTE — Telephone Encounter (Signed)
Received a letter from the patient, BPs are improving in the 130s/74 range. Occasionally has low reading such as 97/51. Also apparently having issues getting back days. Advise patient: BP is better, no change, if he has more frequent low BPs let me know. Please be sure we senda refill for Micardis

## 2012-11-17 NOTE — Telephone Encounter (Signed)
Left detailed msg on pt's vmail. Refill done.  

## 2012-11-18 ENCOUNTER — Encounter (INDEPENDENT_AMBULATORY_CARE_PROVIDER_SITE_OTHER): Payer: Medicare Other

## 2012-11-18 DIAGNOSIS — I10 Essential (primary) hypertension: Secondary | ICD-10-CM | POA: Diagnosis not present

## 2012-11-24 ENCOUNTER — Telehealth: Payer: Self-pay | Admitting: Internal Medicine

## 2012-11-24 NOTE — Telephone Encounter (Signed)
We received a letter yesterday: Average ambulatory BP 126/66 last week. 3 days ago, BPs were low: 83/44, 98/63 and yesterday it was 107/53. Has questions about hydralazine dose He also reports ongoing headache and a right jaw pain. Plan: Hydralazine dose is 20 mg 3 times a day Decrease hydrochlorothiazide 25 mg from one tablet to half tablet daily. BP target 110/60 and 140/85. If it is consistently higher or lower, let me know As far as the headaches, recommend to call neurology Jaw pain needs acute office visit if still hurting. Schedule ROV 10 day to f/u on BP

## 2012-11-24 NOTE — Telephone Encounter (Signed)
Discussed with pt. He states he is still having jaw pain. Scheduled pt for appt 12.4.13 @ 1p. Schedule pt for BP follow-up 12.16.13.

## 2012-11-24 NOTE — Telephone Encounter (Signed)
lmovm for pt to return call.  

## 2012-11-25 ENCOUNTER — Encounter: Payer: Self-pay | Admitting: Internal Medicine

## 2012-11-25 ENCOUNTER — Ambulatory Visit (INDEPENDENT_AMBULATORY_CARE_PROVIDER_SITE_OTHER): Payer: Medicare Other | Admitting: Internal Medicine

## 2012-11-25 VITALS — BP 128/72 | HR 70 | Temp 98.0°F | Wt 195.0 lb

## 2012-11-25 DIAGNOSIS — R51 Headache: Secondary | ICD-10-CM

## 2012-11-25 DIAGNOSIS — I10 Essential (primary) hypertension: Secondary | ICD-10-CM | POA: Diagnosis not present

## 2012-11-25 DIAGNOSIS — R42 Dizziness and giddiness: Secondary | ICD-10-CM | POA: Diagnosis not present

## 2012-11-25 MED ORDER — HYDROCHLOROTHIAZIDE 12.5 MG PO CAPS: 12.5000 mg | ORAL_CAPSULE | Freq: Every day | ORAL | Status: DC

## 2012-11-25 MED ORDER — GABAPENTIN 300 MG PO CAPS: 300.0000 mg | ORAL_CAPSULE | Freq: Three times a day (TID) | ORAL | Status: DC

## 2012-11-25 NOTE — Patient Instructions (Addendum)
Neurontin 300 mg: 1 tablet at night for 5 days, then one tablet twice a day for 5 days, then one tablet 3 times a day --- Call if not gradually  improving. --- Next visit in 2 or 3 weeks

## 2012-11-25 NOTE — Assessment & Plan Note (Addendum)
One-week history of facial pain,  no evidence of dental infection, sinusitis. No rash. Trigeminal neuralgia?. Plan: Trial with Neurontin, if not better may need to see neurology

## 2012-11-25 NOTE — Assessment & Plan Note (Addendum)
Status post evaluation by neurology. 10- 2013 reports reviewed:  normal MRI of the brain, MRA showed mild atherosclerosis at  right PCA and MCA

## 2012-11-25 NOTE — Assessment & Plan Note (Addendum)
NEW problem. See phone note from yesterday, BP under very good control, occasionally even low. Patient was recommended to decrease hydrochlorothiazide 25 mg to half tablet daily. Will issue a new rx (12,5 mg) because he is hard for him to cut the tablet in half. Additionally, he had a renal ultrasound done recently, it showed 60% right renal artery stenosis. Patient hasn't heard from his cardiologist. I will let Dr. Eden Emms know

## 2012-11-25 NOTE — Assessment & Plan Note (Addendum)
Left forehead headache since September, sx are daily. See MRI/MRA reports. Plan: We will treat facial pain with Neurontin, may benefit the headache as well. If not better need to see neurology

## 2012-11-25 NOTE — Progress Notes (Signed)
  Subjective:    Patient ID: [name], male    DOB: 01-25-1941, 71 y.o.   MRN: [id number]  HPI Acute visit One week ago developed right facial pain, initially he thought it was a tooth ache but it "really didn't feel that way"; currently the pain dull, not burning, on and off, start and stops suddenly, pain is spontaneous or after eating or drinking. Also since September it is having a daily headache, at least moderate, located at the left forehead, states that was on advil, it was discontinue, " but it really didn't help" Chart was reviewed, see assessment and plan  Past Medical History: Hypertension GERD H/o esophageal dilatation GI DR Marina Goodell Depression RLS. Musculoskeletal: ---Osteopororsis ---Chronic back pain ---Cervical spondylosis Pulmonary: --- OSA ,CPAP intolerant --- Recurrent pneumonia,bronchoscopy 2005: Chronic organized pneumonia, Dr Delford Field CARD: ---Atypical chest pain, negative stress echo 04/2011, mild LVH. 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 He has tearing eyes but that is at baseline. No diplopia, vision is a slightly blurred at times which is also @ baseline Aches and pains at baseline, no weight loss, no fever.     Objective:   Physical Exam  HENT:  Head:      General -- alert, well-developed, and well-nourished.   Neck - , no LADs HEENT -- TMs normal, throat w/o redness, face  symmetric and slt  tender to palpation (see graphic), nose clear; throat without redness or abnormality, palpation over down normal, no pain elicited by teeht percussion. TMJ without pain. No facial rash.  Neurologic-- alert & oriented X3 , speech and gait normal. EOMI, PERLA Psych-- Cognition and judgment appear intact. Alert and cooperative with normal attention span and concentration.  not anxious appearing and not depressed appearing.      Assessment & Plan:

## 2012-11-26 ENCOUNTER — Telehealth: Payer: Self-pay | Admitting: *Deleted

## 2012-11-26 NOTE — Telephone Encounter (Signed)
F/U [name] or [name] to consider angio for RAS ----- Message ----- From: Alois Cliche, LPN Sent: 40/08/8118 2:17 PM To: Wendall Stade, MD DID NOT GET IN BASKET WITH RESULT NOTE./CY  PT AWARE   APPT MADE WITH DR ARIDA FOR WED 12-02-12 AT 3:30 PM

## 2012-12-02 ENCOUNTER — Ambulatory Visit (INDEPENDENT_AMBULATORY_CARE_PROVIDER_SITE_OTHER): Payer: Medicare Other | Admitting: Cardiovascular Disease

## 2012-12-02 ENCOUNTER — Encounter: Payer: Self-pay | Admitting: Cardiovascular Disease

## 2012-12-02 VITALS — BP 140/70 | HR 74 | Resp 12 | Ht 67.0 in | Wt 200.0 lb

## 2012-12-02 DIAGNOSIS — G4730 Sleep apnea, unspecified: Secondary | ICD-10-CM | POA: Diagnosis not present

## 2012-12-02 DIAGNOSIS — I701 Atherosclerosis of renal artery: Secondary | ICD-10-CM | POA: Diagnosis not present

## 2012-12-02 NOTE — Assessment & Plan Note (Signed)
The patient did not tolerate CPAP. This might be a contributing factor to his hypertension.
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
## 2012-12-02 NOTE — Patient Instructions (Addendum)
Your physician recommends that you schedule a follow-up appointment in: 3 months.  

## 2012-12-02 NOTE — Assessment & Plan Note (Signed)
The patient has unilateral renal artery stenosis. His kidney function is normal. Main issue is labile hypertension. The patient's blood pressure seems to be now controlled after recent adjustment in his medications. The patient has no history of heart failure. I discussed with the patient the indications for renal artery angiography and stenting. Currently, especially with the most recent data from the Coral trial which was presented recently, there was no benefit of renal artery stenting when compared to optimal antihypertensive medical management. Based on that, it was recommended that renal artery stenting should be reserved for patients who've failed medical therapy in spite of full dose of 3 antihypertensive medications including a diuretic. Due to that, I recommend observation for now. I asked him to continue monitoring his blood pressure. I will have him come back in 3 months with home blood pressure readings.

## 2012-12-07 ENCOUNTER — Ambulatory Visit (INDEPENDENT_AMBULATORY_CARE_PROVIDER_SITE_OTHER): Payer: Medicare Other | Admitting: Internal Medicine

## 2012-12-07 ENCOUNTER — Encounter: Payer: Self-pay | Admitting: Internal Medicine

## 2012-12-07 VITALS — BP 142/82 | HR 64 | Temp 98.1°F | Wt 201.0 lb

## 2012-12-07 DIAGNOSIS — I701 Atherosclerosis of renal artery: Secondary | ICD-10-CM | POA: Diagnosis not present

## 2012-12-07 DIAGNOSIS — R51 Headache: Secondary | ICD-10-CM

## 2012-12-07 DIAGNOSIS — M542 Cervicalgia: Secondary | ICD-10-CM

## 2012-12-07 DIAGNOSIS — I10 Essential (primary) hypertension: Secondary | ICD-10-CM | POA: Diagnosis not present

## 2012-12-07 DIAGNOSIS — R519 Headache, unspecified: Secondary | ICD-10-CM

## 2012-12-07 MED ORDER — GABAPENTIN 400 MG PO CAPS: 400.0000 mg | ORAL_CAPSULE | Freq: Three times a day (TID) | ORAL | Status: DC

## 2012-12-07 MED ORDER — TRAMADOL HCL 50 MG PO TABS: 50.0000 mg | ORAL_TABLET | Freq: Three times a day (TID) | ORAL | Status: DC | PRN

## 2012-12-07 NOTE — Assessment & Plan Note (Signed)
Appreciate cardiology eval, medical management for now

## 2012-12-07 NOTE — Assessment & Plan Note (Addendum)
Holding Celebrex 200 mg daily since 10/20/2012 due to increased BP. History of intolerance to Tylenol, several months after daily Tylenol intake he got "flashbacks". Has renal artery stenosis thus I prefer him not to go back to Celebrex. Recommend a low dose of Ultram. See prescription

## 2012-12-07 NOTE — Assessment & Plan Note (Addendum)
The pain is considerably reduce, likely due to to Neurontin. He reports that temporarily the pain moved to the other side of the face which is atypical for neuralgia nevertheless he is responding well to Neurontin Plan: Increase from 300 mg 3 times a day to 400 mg 3 times a day. Description sent

## 2012-12-07 NOTE — Patient Instructions (Addendum)
Increase Neurontin to 400 mg 3 times a day Take Ultram as needed for headaches and neck pain Do not go back to Celebrex Please see your neurologist regards to headaches, if you need a referral let me know Next visit here in 2 months as long as your BP continued to be well-controlled

## 2012-12-07 NOTE — Assessment & Plan Note (Addendum)
Headaches continue despite taking Neurontin which was prescribed for his  facial pain. On looking back, symptoms started few weeks after he started holding Celebrex 10/20/2012, unsure if there is a relationship. Plan:  Low dose of Ultram, see neck pain

## 2012-12-07 NOTE — Assessment & Plan Note (Signed)
BP currently well controlled, see history of present illness.

## 2012-12-07 NOTE — Progress Notes (Signed)
  Subjective:    Patient ID: [name], male    DOB: [birth year], 71 y.o.   MRN: [id number]  HPI Since the last office visit: He started Neurontin for facial pain, pain is 80% better, at some point, pain switch from the right side to left side of the face but now is back on the right side. He continue with headaches as before  History of renal artery stenosis, cardiology note reviewed, they recommended no intervention at this point  except controlling his BP. As far as hypertension, good medication compliance, ambulatory BP log reviewed, BPs are mostly in the 130s, BP occasionally drops to 85 or go as high as 150.  Past Medical History: Hypertension GERD H/o esophageal dilatation GI DR Marina Goodell Depression RLS. Musculoskeletal: ---Osteopororsis ---Chronic back pain ---Cervical spondylosis Pulmonary: --- OSA ,CPAP intolerant --- Recurrent pneumonia,bronchoscopy 2005: Chronic organized pneumonia, Dr Delford Field CARD: ---Atypical chest pain, negative stress echo 04/2011, mild LVH. 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    Lungs -- normal respiratory effort, no intercostal retractions, no accessory muscle use, and normal breath sounds.   Heart-- normal rate, regular rhythm, no murmur, and no gallop.   Extremities-- no pretibial edema bilaterally Psych-- Cognition and judgment appear intact. Alert and cooperative with normal  attention span and concentration.  not anxious appearing and not depressed appearing.       Assessment & Plan:  Today , I spent more than 15 min with the patient, >50% of the time counseling,  reviewing the chart and discussing plan of care with the patient

## 2012-12-11 ENCOUNTER — Other Ambulatory Visit: Payer: Self-pay | Admitting: *Deleted

## 2012-12-11 DIAGNOSIS — R42 Dizziness and giddiness: Secondary | ICD-10-CM

## 2012-12-19 ENCOUNTER — Other Ambulatory Visit: Payer: Self-pay | Admitting: Internal Medicine

## 2012-12-21 MED ORDER — HYDRALAZINE HCL 10 MG PO TABS: 10.0000 mg | ORAL_TABLET | Freq: Three times a day (TID) | ORAL | Status: DC

## 2012-12-21 NOTE — Telephone Encounter (Signed)
Spoke to pt & he states that he did not need a refill on celebrex 200mg . [facility] & [facility].

## 2012-12-21 NOTE — Telephone Encounter (Signed)
Spoke to pt, he states that he [facility]. [facility] & re[facility].

## 2012-12-21 NOTE — Telephone Encounter (Signed)
Refill done.  

## 2013-01-05 DIAGNOSIS — M5030 Other cervical disc degeneration, unspecified cervical region: Secondary | ICD-10-CM | POA: Diagnosis not present

## 2013-01-06 ENCOUNTER — Other Ambulatory Visit: Payer: Self-pay | Admitting: *Deleted

## 2013-01-06 MED ORDER — VALACYCLOVIR HCL 1 G PO TABS: 1000.0000 mg | ORAL_TABLET | Freq: Two times a day (BID) | ORAL | Status: DC | PRN

## 2013-01-06 MED ORDER — TRAMADOL HCL 50 MG PO TABS: 50.0000 mg | ORAL_TABLET | Freq: Three times a day (TID) | ORAL | Status: DC | PRN

## 2013-01-06 NOTE — Telephone Encounter (Signed)
Refill done.  

## 2013-01-08 ENCOUNTER — Ambulatory Visit: Payer: Medicare Other | Admitting: Internal Medicine

## 2013-01-20 ENCOUNTER — Other Ambulatory Visit: Payer: Self-pay | Admitting: *Deleted

## 2013-01-20 MED ORDER — CARVEDILOL 12.5 MG PO TABS: 12.5000 mg | ORAL_TABLET | Freq: Two times a day (BID) | ORAL | Status: DC

## 2013-01-20 NOTE — Telephone Encounter (Signed)
Refill done.  

## 2013-01-30 ENCOUNTER — Emergency Department (HOSPITAL_COMMUNITY)
Admission: EM | Admit: 2013-01-30 | Discharge: 2013-01-30 | Disposition: A | Discharge status: Home / Self Care | Payer: Medicare Other | Attending: Emergency Medicine | Admitting: Emergency Medicine

## 2013-01-30 ENCOUNTER — Encounter (HOSPITAL_COMMUNITY): Payer: Self-pay | Admitting: *Deleted

## 2013-01-30 DIAGNOSIS — Z8619 Personal history of other infectious and parasitic diseases: Secondary | ICD-10-CM | POA: Insufficient documentation

## 2013-01-30 DIAGNOSIS — F0280 Dementia in other diseases classified elsewhere without behavioral disturbance: Secondary | ICD-10-CM | POA: Insufficient documentation

## 2013-01-30 DIAGNOSIS — Z8739 Personal history of other diseases of the musculoskeletal system and connective tissue: Secondary | ICD-10-CM | POA: Insufficient documentation

## 2013-01-30 DIAGNOSIS — H1130 Conjunctival hemorrhage, unspecified eye: Secondary | ICD-10-CM | POA: Diagnosis not present

## 2013-01-30 DIAGNOSIS — G4730 Sleep apnea, unspecified: Secondary | ICD-10-CM | POA: Insufficient documentation

## 2013-01-30 DIAGNOSIS — H1132 Conjunctival hemorrhage, left eye: Secondary | ICD-10-CM | POA: Diagnosis present

## 2013-01-30 DIAGNOSIS — Z8669 Personal history of other diseases of the nervous system and sense organs: Secondary | ICD-10-CM | POA: Diagnosis not present

## 2013-01-30 DIAGNOSIS — Z87828 Personal history of other (healed) physical injury and trauma: Secondary | ICD-10-CM | POA: Diagnosis not present

## 2013-01-30 DIAGNOSIS — Z8639 Personal history of other endocrine, nutritional and metabolic disease: Secondary | ICD-10-CM | POA: Insufficient documentation

## 2013-01-30 DIAGNOSIS — H538 Other visual disturbances: Secondary | ICD-10-CM | POA: Diagnosis not present

## 2013-01-30 DIAGNOSIS — F329 Major depressive disorder, single episode, unspecified: Secondary | ICD-10-CM | POA: Insufficient documentation

## 2013-01-30 DIAGNOSIS — Z8719 Personal history of other diseases of the digestive system: Secondary | ICD-10-CM | POA: Diagnosis not present

## 2013-01-30 DIAGNOSIS — Z8679 Personal history of other diseases of the circulatory system: Secondary | ICD-10-CM | POA: Insufficient documentation

## 2013-01-30 DIAGNOSIS — F3289 Other specified depressive episodes: Secondary | ICD-10-CM | POA: Insufficient documentation

## 2013-01-30 DIAGNOSIS — Z79899 Other long term (current) drug therapy: Secondary | ICD-10-CM | POA: Diagnosis not present

## 2013-01-30 DIAGNOSIS — Z860100 Personal history of colon polyps, unspecified: Secondary | ICD-10-CM | POA: Insufficient documentation

## 2013-01-30 DIAGNOSIS — Z8701 Personal history of pneumonia (recurrent): Secondary | ICD-10-CM | POA: Diagnosis not present

## 2013-01-30 DIAGNOSIS — I6529 Occlusion and stenosis of unspecified carotid artery: Secondary | ICD-10-CM | POA: Diagnosis not present

## 2013-01-30 DIAGNOSIS — R51 Headache: Secondary | ICD-10-CM | POA: Insufficient documentation

## 2013-01-30 DIAGNOSIS — Z862 Personal history of diseases of the blood and blood-forming organs and certain disorders involving the immune mechanism: Secondary | ICD-10-CM | POA: Insufficient documentation

## 2013-01-30 DIAGNOSIS — M810 Age-related osteoporosis without current pathological fracture: Secondary | ICD-10-CM | POA: Diagnosis not present

## 2013-01-30 DIAGNOSIS — Z87891 Personal history of nicotine dependence: Secondary | ICD-10-CM | POA: Diagnosis not present

## 2013-01-30 DIAGNOSIS — I10 Essential (primary) hypertension: Secondary | ICD-10-CM | POA: Diagnosis not present

## 2013-01-30 DIAGNOSIS — G309 Alzheimer's disease, unspecified: Secondary | ICD-10-CM | POA: Insufficient documentation

## 2013-01-30 HISTORY — DX: Chronic ischemic heart disease, unspecified: I25.9

## 2013-01-30 MED ORDER — FLUORESCEIN SODIUM 1 MG OP STRP
1.0000 | ORAL_STRIP | Freq: Once | OPHTHALMIC | Status: AC
  Administered 2013-01-30: 1 via OPHTHALMIC
  Filled 2013-01-30: qty 1

## 2013-01-30 MED ORDER — TETRACAINE HCL 0.5 % OP SOLN
2.0000 [drp] | Freq: Once | OPHTHALMIC | Status: AC
  Administered 2013-01-30: 2 [drp] via OPHTHALMIC
  Filled 2013-01-30: qty 2
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
## 2013-01-30 NOTE — ED Notes (Addendum)
Pt reports left eye started becoming blood shot last night. Slight blurry vision in left eye. Most of sclera is red. This morning pt has 6/10 pain in left eye. Pain radiates down into cheek bone. Pt has had hx of eye floaters in the past.  Pt has hx of HTN, took his HTN med this morning.   Pt reports recently dx with ischemic heart disease, 60% artery going into right kidney. 24% in one carotid artery. 25% two arteries in head.

## 2013-02-01 NOTE — ED Provider Notes (Signed)
I saw and evaluated the patient, reviewed the resident's note and I agree with the findings and plan. Pt with subconj hemorrhage. No eye pain or loss or vision.   Suzi Roots, MD 02/01/13 0800

## 2013-02-02 DIAGNOSIS — H1130 Conjunctival hemorrhage, unspecified eye: Secondary | ICD-10-CM | POA: Diagnosis not present

## 2013-02-08 ENCOUNTER — Encounter: Payer: Self-pay | Admitting: Lab

## 2013-02-09 ENCOUNTER — Ambulatory Visit (INDEPENDENT_AMBULATORY_CARE_PROVIDER_SITE_OTHER): Payer: Medicare Other | Admitting: Internal Medicine

## 2013-02-09 ENCOUNTER — Encounter: Payer: Self-pay | Admitting: Internal Medicine

## 2013-02-09 VITALS — BP 148/84 | HR 57 | Temp 98.0°F | Wt 203.0 lb

## 2013-02-09 DIAGNOSIS — I10 Essential (primary) hypertension: Secondary | ICD-10-CM | POA: Diagnosis not present

## 2013-02-09 DIAGNOSIS — I701 Atherosclerosis of renal artery: Secondary | ICD-10-CM

## 2013-02-09 DIAGNOSIS — E785 Hyperlipidemia, unspecified: Secondary | ICD-10-CM | POA: Diagnosis not present

## 2013-02-09 DIAGNOSIS — R51 Headache: Secondary | ICD-10-CM | POA: Diagnosis not present

## 2013-02-09 LAB — BASIC METABOLIC PANEL
BUN: 17 mg/dL (ref 6–23)
Creatinine, Ser: 1 mg/dL (ref 0.4–1.5)
GFR: 82.89 mL/min (ref >60.00)
Glucose, Bld: 88 mg/dL (ref 70–99)
Potassium: 4.5 mEq/L (ref 3.5–5.1)

## 2013-02-09 NOTE — Patient Instructions (Addendum)
Continue checking her blood pressure as recommended. If the episodes of low BPs (less than 90/55) get more frequent or severe or if you feel poorly during the low BP times please let me know. Next visit in 3 months

## 2013-02-09 NOTE — Assessment & Plan Note (Signed)
Excellent control, continue with Neurontin

## 2013-02-09 NOTE — Assessment & Plan Note (Signed)
Improved compared to last time he was here, requiring tramadol very seldom

## 2013-02-09 NOTE — Assessment & Plan Note (Signed)
Based on the last FLP, I recommended Lipitor. LDL goal is 130 given family history, risk of elevated cholesterol discussed. Patient is reluctant to take more medication,benefits of statins discussed. I respect his decision Recommend to stay as active as possible, watch his diet. Will recheck yearly.

## 2013-02-09 NOTE — Assessment & Plan Note (Signed)
Well-controlled, occasional low BP. No change, check a BMP, see instructions

## 2013-02-09 NOTE — Progress Notes (Signed)
  Subjective:    Patient ID: [name], male    DOB: 1941/05/17, 72 y.o.   MRN: [id number]  HPI Routine followup. Hypertension, he follows his BP closely, range from 130s/50s to 86/58. The episodes of low blood pressure are rare, he feels slightly weak but not poorly. Hyperlipidemia, decided not to take meds. See assessment and plan.  Past Medical History: Hypertension GERD H/o esophageal dilatation GI DR Marina Goodell Depression RLS. Musculoskeletal: ---Osteopororsis ---Chronic back pain ---Cervical spondylosis Pulmonary: --- OSA ,CPAP intolerant --- Recurrent pneumonia,bronchoscopy 2005: Chronic organized pneumonia, Dr Delford Field CARD: ---Atypical chest pain, negative stress echo 04/2011, mild LVH. 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 The headaches have improved since the last time he was here;  has required tramadol only 3-4 times. No chest or shortness of breath No lower extremity edema      Objective:   Physical Exam General -- alert, well-developed Lungs -- normal respiratory effort, no intercostal retractions, no accessory muscle use, and normal breath sounds.   Heart-- normal rate, regular rhythm, no murmur, and no gallop.   Psych-- Cognition and judgment appear intact. Alert and cooperative with normal attention span and concentration.  not anxious appearing and not depressed appearing.      Assessment & Plan:

## 2013-02-09 NOTE — Assessment & Plan Note (Signed)
Checking a BMP today 

## 2013-02-10 DIAGNOSIS — M5030 Other cervical disc degeneration, unspecified cervical region: Secondary | ICD-10-CM | POA: Diagnosis not present

## 2013-02-10 DIAGNOSIS — M47812 Spondylosis without myelopathy or radiculopathy, cervical region: Secondary | ICD-10-CM | POA: Diagnosis not present

## 2013-02-10 DIAGNOSIS — M5137 Other intervertebral disc degeneration, lumbosacral region: Secondary | ICD-10-CM | POA: Diagnosis not present

## 2013-02-10 DIAGNOSIS — IMO0002 Reserved for concepts with insufficient information to code with codable children: Secondary | ICD-10-CM | POA: Diagnosis not present

## 2013-02-12 ENCOUNTER — Other Ambulatory Visit: Payer: Self-pay | Admitting: Internal Medicine

## 2013-02-12 NOTE — Telephone Encounter (Signed)
Refill done.  

## 2013-02-24 ENCOUNTER — Ambulatory Visit (INDEPENDENT_AMBULATORY_CARE_PROVIDER_SITE_OTHER): Payer: Medicare Other | Admitting: Cardiovascular Disease

## 2013-02-24 ENCOUNTER — Encounter: Payer: Self-pay | Admitting: Cardiovascular Disease

## 2013-02-24 VITALS — BP 136/70 | HR 67 | Ht 67.0 in | Wt 199.0 lb

## 2013-02-24 NOTE — Patient Instructions (Addendum)
Your physician has requested that you have a renal artery duplex in November a week or two before your next appt.  During this test, an ultrasound is used to evaluate blood flow to the kidneys. Allow one hour for this exam. Do not eat after midnight the day before and avoid carbonated beverages. Take your medications as you usually do.  Your physician wants you to follow-up in: November a week or two after the renal artery duplex.   You will receive a reminder letter in the mail two months in advance. If you don't receive a letter, please call our office to schedule the follow-up appointment.

## 2013-02-26 ENCOUNTER — Encounter: Payer: Self-pay | Admitting: Cardiovascular Disease

## 2013-02-26 NOTE — Assessment & Plan Note (Signed)
His blood pressure is well controlled. There is no chronic kidney disease. Thus, no proven benefit from renal artery stenting in this situation.  Continue medical therapy.  Repeat renal artery duplex later this year to ensure stability.
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
## 2013-03-09 ENCOUNTER — Encounter: Payer: Self-pay | Admitting: Internal Medicine

## 2013-03-09 ENCOUNTER — Other Ambulatory Visit: Payer: Self-pay

## 2013-04-01 ENCOUNTER — Other Ambulatory Visit: Payer: Self-pay | Admitting: Internal Medicine

## 2013-04-02 NOTE — Telephone Encounter (Signed)
Spoke with pt & he states that he has been taking this med.

## 2013-04-02 NOTE — Telephone Encounter (Signed)
Agree , just let pt know med is not in the list, is he taking it?

## 2013-04-02 NOTE — Telephone Encounter (Signed)
lmovm for pt to return call.  

## 2013-04-02 NOTE — Telephone Encounter (Signed)
Ok RF 180 , 1 RF

## 2013-04-02 NOTE — Telephone Encounter (Signed)
Refill done.  

## 2013-04-02 NOTE — Telephone Encounter (Signed)
Not on pt's current med list. OK to refill? 

## 2013-04-15 ENCOUNTER — Encounter: Payer: Self-pay | Admitting: Internal Medicine

## 2013-05-06 ENCOUNTER — Other Ambulatory Visit: Payer: Self-pay | Admitting: Internal Medicine

## 2013-05-06 NOTE — Telephone Encounter (Signed)
Refill done.  

## 2013-05-10 ENCOUNTER — Encounter: Payer: Self-pay | Admitting: Internal Medicine

## 2013-05-10 ENCOUNTER — Ambulatory Visit (INDEPENDENT_AMBULATORY_CARE_PROVIDER_SITE_OTHER)
Admission: RE | Admit: 2013-05-10 | Discharge: 2013-05-10 | Disposition: A | Discharge status: Home / Self Care | Payer: Medicare Other | Source: Ambulatory Visit | Attending: Internal Medicine | Admitting: Internal Medicine

## 2013-05-10 ENCOUNTER — Ambulatory Visit (INDEPENDENT_AMBULATORY_CARE_PROVIDER_SITE_OTHER): Payer: Medicare Other | Admitting: Internal Medicine

## 2013-05-10 VITALS — BP 138/74 | HR 63 | Temp 98.2°F | Wt 199.0 lb

## 2013-05-10 DIAGNOSIS — M549 Dorsalgia, unspecified: Secondary | ICD-10-CM

## 2013-05-10 DIAGNOSIS — M19049 Primary osteoarthritis, unspecified hand: Secondary | ICD-10-CM | POA: Diagnosis not present

## 2013-05-10 DIAGNOSIS — M1990 Unspecified osteoarthritis, unspecified site: Secondary | ICD-10-CM | POA: Diagnosis not present

## 2013-05-10 DIAGNOSIS — I10 Essential (primary) hypertension: Secondary | ICD-10-CM | POA: Diagnosis not present

## 2013-05-10 DIAGNOSIS — M47812 Spondylosis without myelopathy or radiculopathy, cervical region: Secondary | ICD-10-CM

## 2013-05-10 NOTE — Assessment & Plan Note (Addendum)
Gradual increase in hands and  feet pain, no evidence of UE synovitis on clinical grounds. Most likely this is DJD related, the patient would like to go back on Celebrex however he is high risk for renal insufficiency given RAS. We agreed to increase Ultram from bid prn to 3 tablets daily . If that is not sufficient we'll have to reconsider the celebrex  with very close renal f/u --- History of back-neck  pain on-off, multiple surgeries in the past. back fusion L 3-4-5  09-17-10 DR Talbert Forest surgery July 2012, Dr Wynetta Emery Revised Cspine surgery [phone]

## 2013-05-10 NOTE — Progress Notes (Signed)
  Subjective:    Patient ID: [name], male    DOB: Jan 08, 1941, 72 y.o.   MRN: [id number]  HPI ROV Concerned about pain in his hands and feet. Most troublesome area of pain is the base of the left thumb. He has already discussed this with orthopedic surgery, was told he has DJD, no x-rays have been gone. Would like to go back to Celebrex. Currently using Ultram  Approximately twice a day.  Past Medical History: Hypertension GERD H/o esophageal dilatation GI DR Marina Goodell Depression RLS. Musculoskeletal: ---Osteopororsis ---Chronic back pain ---Cervical spondylosis Pulmonary: --- OSA ,CPAP intolerant --- Recurrent pneumonia,bronchoscopy 2005: Chronic organized pneumonia, Dr Delford Field CARD: ---Atypical chest pain, negative stress echo 04/2011, mild LVH. 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 BP otherwise is normal, within range the majority of the time. Denies any redness or swelling at the wrists or hands.    Objective:   Physical Exam  General -- alert, well-developed, NAD    Extremities-- no pretibial edema bilaterally. Inspection and palpation of the hands and wrists show no synovitis and mild deformities consistent with DJD.  Psych-- Cognition and judgment appear intact. Alert and cooperative with normal attention span and  concentration.  not anxious appearing and not depressed appearing.        Assessment & Plan:

## 2013-05-10 NOTE — Patient Instructions (Addendum)
Take ultram 3 times a day Check the  blood pressure 2 or 3 times a week, be sure it is between 110/60 and 140/85. If it is consistently higher or lower, let me know Next visit as schedule (09-2013)

## 2013-05-10 NOTE — Assessment & Plan Note (Signed)
Good ambulatory BPs readings, he checks twice a day, blood pressure range from 110-140. Very seldom has a high or low  reading  , he's asymptomatic. Plan: no change

## 2013-05-10 NOTE — Assessment & Plan Note (Signed)
See DJD

## 2013-05-10 NOTE — Assessment & Plan Note (Signed)
see DJD

## 2013-05-18 DIAGNOSIS — H2510 Age-related nuclear cataract, unspecified eye: Secondary | ICD-10-CM | POA: Diagnosis not present

## 2013-05-18 DIAGNOSIS — H3530 Unspecified macular degeneration: Secondary | ICD-10-CM | POA: Diagnosis not present

## 2013-05-19 ENCOUNTER — Telehealth: Payer: Self-pay

## 2013-05-19 ENCOUNTER — Encounter: Payer: Self-pay | Admitting: Internal Medicine

## 2013-05-19 NOTE — Telephone Encounter (Signed)
Called to let patient know Dr. Marina Goodell had completed the letter patient had requested to help him with his VA disability claim.  Told patient I would leave it up front and he agreed to come pick it up.

## 2013-06-01 ENCOUNTER — Other Ambulatory Visit: Payer: Self-pay | Admitting: Internal Medicine

## 2013-06-01 NOTE — Telephone Encounter (Signed)
Refill done.  

## 2013-06-08 ENCOUNTER — Telehealth: Payer: Self-pay

## 2013-06-08 NOTE — Telephone Encounter (Signed)
Received a phone call from patient letting us know there were some errors in the letter Dr. Marina Goodell had written for him to give to the [place].  I found a copy of the letter, made the changes, and reprinted it.  Patient is coming to pick it up

## 2013-06-08 NOTE — Telephone Encounter (Signed)
Letter corrected per patient and reprinted

## 2013-06-22 ENCOUNTER — Telehealth: Payer: Self-pay | Admitting: *Deleted

## 2013-06-22 NOTE — Telephone Encounter (Signed)
Pt. Called in stating he has sent Dr. Drue Novel an e-mail stating that he would like something different for his chronic left arm pain since Dr. Drue Novel stopped his Celebrex due to a heart condition 3 months ago. Pt. States over the past two weeks he has had a "flare up" and the Ultram prescribed by Dr. Drue Novel is not working. Pt. [facility] [address] [facility] [facility]. Please advise. Thanks.

## 2013-06-22 NOTE — Telephone Encounter (Signed)
Left message on voicemail to return call.

## 2013-06-22 NOTE — Telephone Encounter (Signed)
He is on tramadol according to the med list; if that is not controlling the pain; he would need to be reevaluated.

## 2013-06-22 NOTE — Telephone Encounter (Signed)
Pt. Returned call and transferred to schedule appointment.

## 2013-06-23 ENCOUNTER — Encounter: Payer: Self-pay | Admitting: Family Medicine

## 2013-06-23 ENCOUNTER — Ambulatory Visit (INDEPENDENT_AMBULATORY_CARE_PROVIDER_SITE_OTHER): Payer: Medicare Other | Admitting: Family Medicine

## 2013-06-23 VITALS — BP 150/70 | HR 54 | Temp 98.2°F | Wt 201.2 lb

## 2013-06-23 DIAGNOSIS — M1990 Unspecified osteoarthritis, unspecified site: Secondary | ICD-10-CM | POA: Diagnosis not present

## 2013-06-23 MED ORDER — PREDNISONE 20 MG PO TABS: ORAL_TABLET | ORAL | Status: DC

## 2013-06-23 MED ORDER — TRAMADOL HCL 50 MG PO TABS: 50.0000 mg | ORAL_TABLET | Freq: Three times a day (TID) | ORAL | Status: DC | PRN

## 2013-06-23 NOTE — Patient Instructions (Addendum)
We'll notify you of your appt Start the Prednisone as directed- take w/ food Call with any questions or concerns Hang in there!!!

## 2013-06-23 NOTE — Assessment & Plan Note (Signed)
Deteriorated.  Now w/ grip weakness and decreased reflexes on L. Refer urgently to neurosurg for evaluation b/c 2 weeks is not soon enough.  Start prednisone for inflammation to preserve nerve fxn.  No additional NSAIDs due to RAS.  Refill on Ultram provided.  Will follow.

## 2013-06-23 NOTE — Progress Notes (Signed)
  Subjective:    Patient ID: [name], male    DOB: [birth year], 72 y.o.   MRN: [id number]  HPI Grip weakness in L hand- pt had xray of hand in May that shows DJD of L hand.  Has had multiple neck/back surgeries.  Now w/ chronic pain.  This pain changed when he began having weakness in L hand.  Has been taking Tramadol prn for pain- needs refill.  Has RAS- avoiding NSAIDs.  Has appt upcoming w/ Neurosurgeon Wynetta Emery) 7/18.   Review of Systems For ROS see HPI     Objective:   Physical Exam  Vitals reviewed. Constitutional: He is oriented to person, place, and time. He appears well-developed and well-nourished. No distress.  Cardiovascular: Intact distal pulses.   Neurological: He is alert and oriented to person, place, and time. He displays abnormal reflex (diminshed reflexes at biceps and supinator on L).  Strength 5/5 on R, 4/5 on L (pt reports 'today is a good day')  Skin: Skin is warm and dry.  Psychiatric: He has a normal mood and affect. His behavior is normal.          Assessment & Plan:

## 2013-06-29 ENCOUNTER — Other Ambulatory Visit: Payer: Self-pay | Admitting: Neurosurgery

## 2013-06-29 DIAGNOSIS — G5600 Carpal tunnel syndrome, unspecified upper limb: Secondary | ICD-10-CM | POA: Diagnosis not present

## 2013-06-29 DIAGNOSIS — M4800 Spinal stenosis, site unspecified: Secondary | ICD-10-CM | POA: Diagnosis not present

## 2013-06-29 DIAGNOSIS — M5010 Cervical disc disorder with radiculopathy, unspecified cervical region: Secondary | ICD-10-CM

## 2013-06-29 DIAGNOSIS — M5412 Radiculopathy, cervical region: Secondary | ICD-10-CM | POA: Diagnosis not present

## 2013-06-29 DIAGNOSIS — M5126 Other intervertebral disc displacement, lumbar region: Secondary | ICD-10-CM | POA: Diagnosis not present

## 2013-06-30 ENCOUNTER — Ambulatory Visit
Admission: RE | Admit: 2013-06-30 | Discharge: 2013-06-30 | Disposition: A | Discharge status: Home / Self Care | Payer: Medicare Other | Source: Ambulatory Visit | Attending: Neurosurgery | Admitting: Neurosurgery

## 2013-06-30 DIAGNOSIS — M4802 Spinal stenosis, cervical region: Secondary | ICD-10-CM | POA: Diagnosis not present

## 2013-06-30 DIAGNOSIS — M5010 Cervical disc disorder with radiculopathy, unspecified cervical region: Secondary | ICD-10-CM

## 2013-07-02 ENCOUNTER — Telehealth: Payer: Self-pay | Admitting: Internal Medicine

## 2013-07-02 MED ORDER — HYDROCODONE-ACETAMINOPHEN 5-325 MG PO TABS: 1.0000 | ORAL_TABLET | Freq: Four times a day (QID) | ORAL | Status: DC | PRN

## 2013-07-02 NOTE — Telephone Encounter (Signed)
Won't do 90 days, we need to first try, needs to stop ultram and try g hydrocortisone to see if that works.see prescription

## 2013-07-02 NOTE — Telephone Encounter (Signed)
Ok, rec tylenol as needed, ultram. avoid Celebrex .

## 2013-07-02 NOTE — Telephone Encounter (Signed)
Discussed with pt.  He states at this time he does not want to try the hydrocodone. Pt did state that he has also seen his neurosurgeon & is waiting to here back from his ortho doctor to decide on what they are going to do about his disc.

## 2013-07-02 NOTE — Telephone Encounter (Signed)
Patient sent a BP log, readings range from 97/51 to 133/78, pulse in the 60s. Patient had to stop celebrex due to renal artery stenosis,tramadol is not controlling his pain well. Please call patient,  1. BP well controlled 2. I know he is intolerant to oxycodone, is he willing try hydrocodone instead of tramadol for pain?

## 2013-07-02 NOTE — Telephone Encounter (Signed)
Spoke to pt, he states he [place] unable to take tylenol as well. Pt states he would now like to try hydrocodone. [facility]

## 2013-07-02 NOTE — Telephone Encounter (Signed)
rx sent to pharmacy

## 2013-07-15 ENCOUNTER — Telehealth: Payer: Self-pay | Admitting: *Deleted

## 2013-07-15 NOTE — Telephone Encounter (Signed)
Spoke with pts wife to follow up as requested buy provider. Spouse states pt is doing well and that the new medicine is working as far as she can tell. Advised wife to call us with any further questions or concerns.

## 2013-07-19 ENCOUNTER — Encounter: Payer: Self-pay | Admitting: Internal Medicine

## 2013-07-19 ENCOUNTER — Ambulatory Visit (INDEPENDENT_AMBULATORY_CARE_PROVIDER_SITE_OTHER): Payer: Medicare Other | Admitting: Internal Medicine

## 2013-07-19 VITALS — BP 150/76 | HR 58 | Ht 67.0 in | Wt 198.2 lb

## 2013-07-19 DIAGNOSIS — G4733 Obstructive sleep apnea (adult) (pediatric): Secondary | ICD-10-CM | POA: Diagnosis not present
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
## 2013-07-19 NOTE — Patient Instructions (Addendum)
We will pull paper chart for 2006 sleep study  Order- referal to Orthodontist Dr Althea Grimmer Dx Obstructive sleep apnea - consider oral appliance

## 2013-07-19 NOTE — Assessment & Plan Note (Signed)
NPSG 02/28/05- AHI 55/ hr with desaturation to 83% and loud snoring. He failed initial trial CPAP, complaining of claustrophobia and aggravation of posttraumatic stress disorder while trying to wear a fullface mask. I offered trial with nasal pillows. He is much more interested in learning about oral appliances, which we discussed. Plan-we will seek old paper chart for full copy of his diagnostic sleep study, but we do have the physician interpretation. I have offered referral to discuss oral appliance and he agrees.

## 2013-07-20 ENCOUNTER — Other Ambulatory Visit: Payer: Self-pay | Admitting: Family Medicine

## 2013-07-21 DIAGNOSIS — M5412 Radiculopathy, cervical region: Secondary | ICD-10-CM | POA: Diagnosis not present

## 2013-07-21 DIAGNOSIS — M47812 Spondylosis without myelopathy or radiculopathy, cervical region: Secondary | ICD-10-CM | POA: Diagnosis not present

## 2013-07-21 DIAGNOSIS — M5030 Other cervical disc degeneration, unspecified cervical region: Secondary | ICD-10-CM | POA: Diagnosis not present

## 2013-07-23 NOTE — Telephone Encounter (Signed)
Dr. Paz pt 

## 2013-07-23 NOTE — Telephone Encounter (Signed)
Last refill:06-27-13 Last OV:06-23-13 Please advise.//AB/CMA

## 2013-07-27 ENCOUNTER — Encounter: Payer: Self-pay | Admitting: Pulmonary Disease

## 2013-07-27 NOTE — Telephone Encounter (Signed)
Dr. Paz pt 

## 2013-08-03 NOTE — Telephone Encounter (Signed)
According to notes in EMR, this medication was discontinued by Dr. Drue Novel on 07/02/13.

## 2013-08-10 ENCOUNTER — Other Ambulatory Visit: Payer: Self-pay | Admitting: Neurosurgery

## 2013-08-10 ENCOUNTER — Ambulatory Visit
Admission: RE | Admit: 2013-08-10 | Discharge: 2013-08-10 | Disposition: A | Discharge status: Home / Self Care | Payer: Medicare Other | Source: Ambulatory Visit | Attending: Neurosurgery | Admitting: Neurosurgery

## 2013-08-10 DIAGNOSIS — M5412 Radiculopathy, cervical region: Secondary | ICD-10-CM | POA: Diagnosis not present

## 2013-08-10 DIAGNOSIS — M539 Dorsopathy, unspecified: Secondary | ICD-10-CM | POA: Diagnosis not present

## 2013-08-10 DIAGNOSIS — M545 Low back pain: Secondary | ICD-10-CM | POA: Diagnosis not present

## 2013-08-10 DIAGNOSIS — M5126 Other intervertebral disc displacement, lumbar region: Secondary | ICD-10-CM

## 2013-08-11 ENCOUNTER — Ambulatory Visit
Admission: RE | Admit: 2013-08-11 | Discharge: 2013-08-11 | Disposition: A | Discharge status: Home / Self Care | Payer: Medicare Other | Source: Ambulatory Visit | Attending: Neurosurgery | Admitting: Neurosurgery

## 2013-08-11 DIAGNOSIS — M5412 Radiculopathy, cervical region: Secondary | ICD-10-CM

## 2013-08-11 DIAGNOSIS — M5126 Other intervertebral disc displacement, lumbar region: Secondary | ICD-10-CM | POA: Diagnosis not present

## 2013-08-11 DIAGNOSIS — M48061 Spinal stenosis, lumbar region without neurogenic claudication: Secondary | ICD-10-CM | POA: Diagnosis not present

## 2013-08-11 DIAGNOSIS — M47817 Spondylosis without myelopathy or radiculopathy, lumbosacral region: Secondary | ICD-10-CM | POA: Diagnosis not present

## 2013-08-12 ENCOUNTER — Other Ambulatory Visit: Payer: Self-pay | Admitting: *Deleted

## 2013-08-12 ENCOUNTER — Other Ambulatory Visit: Payer: Medicare Other

## 2013-08-12 DIAGNOSIS — G609 Hereditary and idiopathic neuropathy, unspecified: Secondary | ICD-10-CM

## 2013-08-12 MED ORDER — GABAPENTIN 400 MG PO CAPS: 400.0000 mg | ORAL_CAPSULE | Freq: Three times a day (TID) | ORAL | Status: DC

## 2013-08-12 NOTE — Telephone Encounter (Signed)
RF for neurontin sent to [facility]

## 2013-08-19 ENCOUNTER — Other Ambulatory Visit: Payer: Self-pay | Admitting: Neurosurgery

## 2013-08-19 DIAGNOSIS — M4800 Spinal stenosis, site unspecified: Secondary | ICD-10-CM

## 2013-08-19 DIAGNOSIS — M5126 Other intervertebral disc displacement, lumbar region: Secondary | ICD-10-CM | POA: Diagnosis not present

## 2013-08-20 ENCOUNTER — Ambulatory Visit
Admission: RE | Admit: 2013-08-20 | Discharge: 2013-08-20 | Disposition: A | Discharge status: Home / Self Care | Payer: Medicare Other | Source: Ambulatory Visit | Attending: Neurosurgery | Admitting: Neurosurgery

## 2013-08-20 DIAGNOSIS — M4800 Spinal stenosis, site unspecified: Secondary | ICD-10-CM

## 2013-08-20 DIAGNOSIS — M5126 Other intervertebral disc displacement, lumbar region: Secondary | ICD-10-CM | POA: Diagnosis not present

## 2013-08-20 DIAGNOSIS — M48061 Spinal stenosis, lumbar region without neurogenic claudication: Secondary | ICD-10-CM | POA: Diagnosis not present

## 2013-09-02 ENCOUNTER — Other Ambulatory Visit: Payer: Self-pay | Admitting: Internal Medicine

## 2013-09-02 NOTE — Telephone Encounter (Signed)
rx refilled per protocol. DJR  

## 2013-09-10 DIAGNOSIS — M47817 Spondylosis without myelopathy or radiculopathy, lumbosacral region: Secondary | ICD-10-CM | POA: Diagnosis not present

## 2013-09-10 DIAGNOSIS — M5137 Other intervertebral disc degeneration, lumbosacral region: Secondary | ICD-10-CM | POA: Diagnosis not present

## 2013-09-10 DIAGNOSIS — IMO0002 Reserved for concepts with insufficient information to code with codable children: Secondary | ICD-10-CM | POA: Diagnosis not present

## 2013-09-10 DIAGNOSIS — M5412 Radiculopathy, cervical region: Secondary | ICD-10-CM | POA: Diagnosis not present

## 2013-09-10 DIAGNOSIS — M47812 Spondylosis without myelopathy or radiculopathy, cervical region: Secondary | ICD-10-CM | POA: Diagnosis not present

## 2013-09-23 DIAGNOSIS — M4800 Spinal stenosis, site unspecified: Secondary | ICD-10-CM | POA: Diagnosis not present

## 2013-09-23 DIAGNOSIS — M4120 Other idiopathic scoliosis, site unspecified: Secondary | ICD-10-CM | POA: Diagnosis not present

## 2013-10-08 DIAGNOSIS — M5412 Radiculopathy, cervical region: Secondary | ICD-10-CM | POA: Diagnosis not present

## 2013-10-08 DIAGNOSIS — M47812 Spondylosis without myelopathy or radiculopathy, cervical region: Secondary | ICD-10-CM | POA: Diagnosis not present

## 2013-10-08 DIAGNOSIS — M47817 Spondylosis without myelopathy or radiculopathy, lumbosacral region: Secondary | ICD-10-CM | POA: Diagnosis not present

## 2013-10-08 DIAGNOSIS — M5030 Other cervical disc degeneration, unspecified cervical region: Secondary | ICD-10-CM | POA: Diagnosis not present

## 2013-10-08 DIAGNOSIS — IMO0002 Reserved for concepts with insufficient information to code with codable children: Secondary | ICD-10-CM | POA: Diagnosis not present

## 2013-10-08 DIAGNOSIS — M5137 Other intervertebral disc degeneration, lumbosacral region: Secondary | ICD-10-CM | POA: Diagnosis not present

## 2013-10-12 ENCOUNTER — Encounter: Payer: Medicare Other | Admitting: Internal Medicine

## 2013-10-13 ENCOUNTER — Telehealth: Payer: Self-pay

## 2013-10-13 NOTE — Telephone Encounter (Signed)
Medication List and allergies: reviewed and updated  90 day supply/mail order: [facility]  Immunizations due: admin flu vaccine upon arrival  A/P:   Flu vaccine this visit [facility] next due 2017; q 5 years last 2012 PSA 04/2011 WNL HM UTD   To Discuss with Provider: Reaction to hydrocodone-acetaminophen

## 2013-10-18 ENCOUNTER — Ambulatory Visit (INDEPENDENT_AMBULATORY_CARE_PROVIDER_SITE_OTHER): Payer: Medicare Other | Admitting: Internal Medicine

## 2013-10-18 ENCOUNTER — Encounter: Payer: Self-pay | Admitting: Internal Medicine

## 2013-10-18 VITALS — BP 157/72 | HR 75 | Temp 98.4°F | Ht 67.0 in | Wt 193.0 lb

## 2013-10-18 DIAGNOSIS — E785 Hyperlipidemia, unspecified: Secondary | ICD-10-CM | POA: Diagnosis not present

## 2013-10-18 DIAGNOSIS — I701 Atherosclerosis of renal artery: Secondary | ICD-10-CM | POA: Diagnosis not present

## 2013-10-18 DIAGNOSIS — R739 Hyperglycemia, unspecified: Secondary | ICD-10-CM

## 2013-10-18 DIAGNOSIS — Z79899 Other long term (current) drug therapy: Secondary | ICD-10-CM | POA: Diagnosis not present

## 2013-10-18 DIAGNOSIS — Z23 Encounter for immunization: Secondary | ICD-10-CM | POA: Diagnosis not present

## 2013-10-18 DIAGNOSIS — Z0000 Encounter for general adult medical examination without abnormal findings: Secondary | ICD-10-CM

## 2013-10-18 DIAGNOSIS — M1990 Unspecified osteoarthritis, unspecified site: Secondary | ICD-10-CM

## 2013-10-18 DIAGNOSIS — I10 Essential (primary) hypertension: Secondary | ICD-10-CM | POA: Diagnosis not present

## 2013-10-18 DIAGNOSIS — R7309 Other abnormal glucose: Secondary | ICD-10-CM | POA: Diagnosis not present

## 2013-10-18 DIAGNOSIS — G4733 Obstructive sleep apnea (adult) (pediatric): Secondary | ICD-10-CM | POA: Diagnosis not present

## 2013-10-18 DIAGNOSIS — F329 Major depressive disorder, single episode, unspecified: Secondary | ICD-10-CM

## 2013-10-18 DIAGNOSIS — R51 Headache: Secondary | ICD-10-CM

## 2013-10-18 DIAGNOSIS — Z125 Encounter for screening for malignant neoplasm of prostate: Secondary | ICD-10-CM

## 2013-10-18 LAB — LIPID PANEL: HDL: 44.1 mg/dL (ref >39.00)

## 2013-10-18 LAB — LDL CHOLESTEROL, DIRECT: Direct LDL: 146.9 mg/dL

## 2013-10-18 LAB — CBC WITH DIFFERENTIAL/PLATELET
Basophils Relative: 0.7 % (ref 0.0–3.0)
Eosinophils Relative: 1.8 % (ref 0.0–5.0)
Hemoglobin: 14.8 g/dL (ref 13.0–17.0)
Lymphocytes Relative: 18.3 % (ref 12.0–46.0)
MCV: 92.8 fl (ref 78.0–100.0)
Monocytes Absolute: 0.7 10*3/uL (ref 0.1–1.0)
Neutrophils Relative %: 72.1 % (ref 43.0–77.0)
RBC: 4.75 Mil/uL (ref 4.22–5.81)
WBC: 9.8 10*3/uL (ref 4.5–10.5)

## 2013-10-18 LAB — COMPREHENSIVE METABOLIC PANEL
ALT: 25 U/L (ref 0–53)
AST: 20 U/L (ref 0–37)
Calcium: 9.7 mg/dL (ref 8.4–10.5)
Chloride: 101 mEq/L (ref 96–112)
Creatinine, Ser: 0.9 mg/dL (ref 0.4–1.5)
Potassium: 4.3 mEq/L (ref 3.5–5.1)
Sodium: 141 mEq/L (ref 135–145)
Total Protein: 6.9 g/dL (ref 6.0–8.3)

## 2013-10-18 NOTE — Assessment & Plan Note (Signed)
See previous entry, will check  A FLP

## 2013-10-18 NOTE — Assessment & Plan Note (Signed)
Occasional dysphasia, symptoms started after neck surgery

## 2013-10-18 NOTE — Assessment & Plan Note (Addendum)
Tdap 2012 flu shot today pneumonia shot 03-2009 shingles shot -- 2008  Cscope several Cscopes due to polyps, Cscope  12-07, TICS , no polyp; Cscope 09-2011: 3 polyps, next per GI Unable to exercise as much as he likes due to pain.  Counseling about diet. DRE 2013 normal, will try to check a  PSA (if the right dx code found)

## 2013-10-18 NOTE — Assessment & Plan Note (Addendum)
Had a # of XRs, saw Dr Wynetta Emery, will be recheck ~ 12-2013 to see if further surgery is needed. See allergies, currently on tramadol. Also on gabapentin started for another issue (had facial pain before)  but is likely helping chronic  pain as well.

## 2013-10-18 NOTE — Assessment & Plan Note (Signed)
Currently not an issue 

## 2013-10-18 NOTE — Assessment & Plan Note (Signed)
Closely follow up by cardiology, BMP today, BP under excellent control

## 2013-10-18 NOTE — Assessment & Plan Note (Signed)
On neurontin, see comments under DJD

## 2013-10-18 NOTE — Progress Notes (Signed)
Subjective:    Patient ID: [name], male    DOB: 26-May-1941, 72 y.o.   MRN: [id number]  HPI Here for Medicare AWV:  1. Risk factors based on Past M, S, F history: reviewed  2. Physical Activities: limited d/t pain, still plays golf x 2 week 3. Depression/mood: neg screening  4. Hearing: Decreased , uses hearing aids waiting for new ones   5. ADL's: Independent  6. Fall Risk: + recent falls no major injury, counseled about prevention  7. home Safety: does feel safe at home  8. Height, weight, &visual acuity: see VS, sees ophtalmology yearly, doing well  9. Counseling: provided  10. Labs ordered based on risk factors: if needed  11. Referral Coordination: if needed  12. Care Plan, see assessment and plan  13. Cognitive Assessment:motor skills limited by neck pain, cognition normal    In addition, today we discussed the following: Hypertension, good medication compliance, ambulatory BP consistently around 120, pulse in the 50s, 60s. GERD--continue with occasional dysphagia, no actual heartburn, on no medications. Neck pain, status post eval by neurosurgery, multiple x-rays done, he will be re-eval 12-2013 for possible surgery but so far that has not been recommended. Sleep apnea, saw Dr. Maple Ferrera, was referred to get a dental appliance but so far has not gotten it, currently sleeps very well Today reports a reaction to  Acetaminophen, allergies edited    Past Medical History: Hypertension GERD H/o esophageal dilatation GI DR Marina Goodell Depression RLS. Musculoskeletal: ---Osteopororsis ---Chronic back pain ---Cervical spondylosis Pulmonary: --- OSA ,CPAP intolerant --- Recurrent pneumonia,bronchoscopy 2005: Chronic organized pneumonia, Dr Delford Field CARD: ---Atypical chest pain, negative stress echo 04/2011, mild LVH. Decrease in hearing Dx w/ Pincus Badder DISEASE (2002 Dr Barb Merino resolved Cataracts RAS    Past Surgical History  Procedure Laterality Date  . Uvoloplasty   2004  . Nissen fundoplication  12/2009  . Back fusion  09/17/10    L 3-4-5, Dr.Cram  . Anterior cervical diskectomy with fusion  2012    C5  Dr Wynetta Emery, anterior approach  . Bunionectomy  2007    left  . Bone graft/oral  2007  . Colonoscopy    . Tonsillectomy  1947  . Septoplasty  2005  . Anterior cervical decomp/discectomy fusion  12/11/2011    Procedure: ANTERIOR CERVICAL DECOMPRESSION/DISCECTOMY FUSION 1 LEVEL/HARDWARE REMOVAL;  Surgeon: Mariam Dollar;  Location: [facility];  Service: Neurosurgery;  Laterality: Bilateral;  Exploration of cervical fusion with removal of hardware   History   Social History  . Marital Status: Married    Spouse Name: N/A    Number of Children: 1  . Years of Education: N/A   Occupational History  . 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 Smoking status: Former Smoker -- 50 years    Types: Pipe    Quit date: 07/10/2009  . Smokeless tobacco: Never Used     Comment: quit in 2010  . Alcohol Use: 0.0 oz/week     Comment: socially less than 1 beer a week  . Drug Use: No  . Sexual Activity: Yes   Other Topics Concern  . 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 Heart attack      GP, aunt,brother(had  several angoplasties, started in lat 40s, pass away in his last 50s)  . Diabetes      GM  . Colon cancer      uncle- age 78s  . Melanoma      uncle  . Prostate cancer Brother 25  . Pancreatic cancer Brother   . Anesthesia problems Neg Hx   . Hypotension Neg Hx   . Malignant hyperthermia Neg Hx   . Pseudochol deficiency Neg Hx   . Colon cancer Paternal Uncle     Review of Systems  No  CP, SOB, lower extremity edema Denies  nausea, vomiting diarrhea Denies  blood in the stools (-) cough, sputum production,  chest congestion No dysuria, gross hematuria, difficulty  urinating       Objective:   Physical Exam  BP 157/72  Pulse 75  Temp(Src) 98.4 F (36.9 C)  Ht 5\' 7"  (1.702 m)  Wt 193 lb (87.544 kg)  BMI 30.22 kg/m2  SpO2 93% General -- alert, well-developed, NAD.  Lungs -- normal respiratory effort, no intercostal retractions, no accessory muscle use, and normal breath sounds.  Heart-- normal rate, regular rhythm, no murmur.  Abdomen-- Not distended, good bowel sounds,soft, non-tender. Extremities-- no pretibial edema bilaterally  Neurologic--  alert & oriented X3. Speech normal, walks w/ some difficulty Psych-- Cognition and judgment appear intact. Cooperative with normal attention span and concentration. No anxious appearing , no depressed appearing.     Assessment & Plan:

## 2013-10-18 NOTE — Assessment & Plan Note (Signed)
Under excellent control, check a CMP and CBC, continue with present care

## 2013-10-18 NOTE — Patient Instructions (Signed)
Get your blood work before you leave  Next visit in 6 months  follow up (30 minutes) No Fasting Please make an appointment    Fall Prevention and Home Safety Falls cause injuries and can affect all age groups. It is possible to use preventive measures to significantly decrease the likelihood of falls. There are many simple measures which can make your home safer and prevent falls. OUTDOORS  Repair cracks and edges of walkways and driveways.  Remove high doorway thresholds.  Trim shrubbery on the main path into your home.  Have good outside lighting.  Clear walkways of tools, rocks, debris, and clutter.  Check that handrails are not broken and are securely fastened. Both sides of steps should have handrails.  Have leaves, snow, and ice cleared regularly.  Use sand or salt on walkways during winter months.  In the garage, clean up grease or oil spills. BATHROOM  Install night lights.  Install grab bars by the toilet and in the tub and shower.  Use non-skid mats or decals in the tub or shower.  Place a plastic non-slip stool in the shower to sit on, if needed.  Keep floors dry and clean up all water on the floor immediately.  Remove soap buildup in the tub or shower on a regular basis.  Secure bath mats with non-slip, double-sided rug tape.  Remove throw rugs and tripping hazards from the floors. BEDROOMS  Install night lights.  Make sure a bedside light is easy to reach.  Do not use oversized bedding.  Keep a telephone by your bedside.  Have a firm chair with side arms to use for getting dressed.  Remove throw rugs and tripping hazards from the floor. KITCHEN  Keep handles on pots and pans turned toward the center of the stove. Use back burners when possible.  Clean up spills quickly and allow time for drying.  Avoid walking on wet floors.  Avoid hot utensils and knives.  Position shelves so they are not too high or low.  Place commonly used objects  within easy reach.  If necessary, use a sturdy step stool with a grab bar when reaching.  Keep electrical cables out of the way.  Do not use floor polish or wax that makes floors slippery. If you must use wax, use non-skid floor wax.  Remove throw rugs and tripping hazards from the floor. STAIRWAYS  Never leave objects on stairs.  Place handrails on both sides of stairways and use them. Fix any loose handrails. Make sure handrails on both sides of the stairways are as long as the stairs.  Check carpeting to make sure it is firmly attached along stairs. Make repairs to worn or loose carpet promptly.  Avoid placing throw rugs at the top or bottom of stairways, or properly secure the rug with carpet tape to prevent slippage. Get rid of throw rugs, if possible.  Have an electrician put in a light switch at the top and bottom of the stairs. OTHER FALL PREVENTION TIPS  Wear low-heel or rubber-soled shoes that are supportive and fit well. Wear closed toe shoes.  When using a stepladder, make sure it is fully opened and both spreaders are firmly locked. Do not climb a closed stepladder.  Add color or contrast paint or tape to grab bars and handrails in your home. Place contrasting color strips on first and last steps.  Learn and use mobility aids as needed. Install an electrical emergency response system.  Turn on lights to avoid   dark areas. Replace light bulbs that burn out immediately. Get light switches that glow.  Arrange furniture to create clear pathways. Keep furniture in the same place.  Firmly attach carpet with non-skid or double-sided tape.  Eliminate uneven floor surfaces.  Select a carpet pattern that does not visually hide the edge of steps.  Be aware of all pets. OTHER HOME SAFETY TIPS  Set the water temperature for 120 F (48.8 C).  Keep emergency numbers on or near the telephone.  Keep smoke detectors on every level of the home and near sleeping  areas. Document Released: 11/29/2002 Document Revised: 06/09/2012 Document Reviewed: 02/28/2012 [employer] Patient Information 2014 [employer].  

## 2013-10-18 NOTE — Assessment & Plan Note (Signed)
Patient reports he was referred to get a dental appliance which he has not gotten yet

## 2013-10-19 NOTE — Addendum Note (Signed)
Addended by: Verdie Shire on: 10/19/2013 04:36 PM   Modules accepted: Orders

## 2013-10-20 ENCOUNTER — Telehealth: Payer: Self-pay | Admitting: *Deleted

## 2013-10-20 DIAGNOSIS — Z125 Encounter for screening for malignant neoplasm of prostate: Secondary | ICD-10-CM

## 2013-10-20 NOTE — Telephone Encounter (Signed)
Patient called back and i scheduled him for Friday at 8:15am

## 2013-10-20 NOTE — Telephone Encounter (Signed)
Future lab ordered and sent.//AB/CMA 

## 2013-10-20 NOTE — Telephone Encounter (Signed)
LM @ (12:21pm) asking the pt to RTC regarding scheduling appt to have PSA drawn.  Unable to add test to recent labs.//AB/CMA

## 2013-10-22 ENCOUNTER — Other Ambulatory Visit (INDEPENDENT_AMBULATORY_CARE_PROVIDER_SITE_OTHER): Payer: Medicare Other

## 2013-10-22 DIAGNOSIS — Z125 Encounter for screening for malignant neoplasm of prostate: Secondary | ICD-10-CM

## 2013-10-22 LAB — PSA: PSA: 0.79 ng/mL (ref 0.10–4.00)

## 2013-10-27 ENCOUNTER — Ambulatory Visit (HOSPITAL_COMMUNITY): Payer: Medicare Other | Attending: Cardiology

## 2013-10-27 DIAGNOSIS — I10 Essential (primary) hypertension: Secondary | ICD-10-CM | POA: Diagnosis not present

## 2013-10-27 DIAGNOSIS — E785 Hyperlipidemia, unspecified: Secondary | ICD-10-CM | POA: Diagnosis not present

## 2013-10-27 DIAGNOSIS — Z87891 Personal history of nicotine dependence: Secondary | ICD-10-CM | POA: Diagnosis not present

## 2013-10-27 DIAGNOSIS — I701 Atherosclerosis of renal artery: Secondary | ICD-10-CM | POA: Insufficient documentation

## 2013-11-02 ENCOUNTER — Ambulatory Visit: Payer: Medicare Other | Admitting: Cardiovascular Disease

## 2013-11-09 ENCOUNTER — Encounter: Payer: Self-pay | Admitting: Cardiovascular Disease

## 2013-11-09 ENCOUNTER — Ambulatory Visit (INDEPENDENT_AMBULATORY_CARE_PROVIDER_SITE_OTHER): Payer: Medicare Other | Admitting: Cardiovascular Disease

## 2013-11-09 VITALS — BP 151/82 | HR 65 | Ht 67.0 in | Wt 199.8 lb

## 2013-11-09 DIAGNOSIS — I701 Atherosclerosis of renal artery: Secondary | ICD-10-CM | POA: Diagnosis not present
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
## 2013-11-09 NOTE — Assessment & Plan Note (Signed)
Home blood pressure readings were reviewed. His blood pressure is under excellent control on current medications. He had a recent repeat renal artery duplex which showed stable right renal artery stenosis. I suspect that the stenosis is borderline significant given that the renal/aortic ratio is around 3.5 .  Currently, there is no indication for renal artery revascularization given that his blood pressure is reasonably controlled and he has no chronic kidney disease. I will have him followup with me on a yearly basis.

## 2013-11-09 NOTE — Patient Instructions (Signed)
Your physician recommends that you continue on your current medications as directed. Please refer to the Current Medication list given to you today.  Your physician wants you to follow-up in: 1 year with DR. Kirke Corin You will receive a reminder letter in the mail two months in advance. If you don't receive a letter, please call our office to schedule the follow-up appointment.

## 2013-11-13 ENCOUNTER — Other Ambulatory Visit: Payer: Self-pay | Admitting: Internal Medicine

## 2013-11-15 NOTE — Telephone Encounter (Signed)
rx refilled per protocol. DJR  

## 2013-11-27 ENCOUNTER — Other Ambulatory Visit: Payer: Self-pay | Admitting: Internal Medicine

## 2013-11-29 NOTE — Telephone Encounter (Signed)
Hydralazine refilled per protocol

## 2013-12-09 ENCOUNTER — Encounter: Payer: Self-pay | Admitting: Cardiology

## 2013-12-25 ENCOUNTER — Other Ambulatory Visit: Payer: Self-pay | Admitting: Internal Medicine

## 2013-12-28 DIAGNOSIS — I10 Essential (primary) hypertension: Secondary | ICD-10-CM | POA: Diagnosis not present

## 2013-12-28 DIAGNOSIS — M5412 Radiculopathy, cervical region: Secondary | ICD-10-CM | POA: Diagnosis not present

## 2013-12-28 DIAGNOSIS — M5126 Other intervertebral disc displacement, lumbar region: Secondary | ICD-10-CM | POA: Diagnosis not present

## 2013-12-28 DIAGNOSIS — E669 Obesity, unspecified: Secondary | ICD-10-CM | POA: Diagnosis not present

## 2014-01-12 ENCOUNTER — Encounter: Payer: Self-pay | Admitting: Internal Medicine

## 2014-01-20 ENCOUNTER — Other Ambulatory Visit: Payer: Self-pay | Admitting: Internal Medicine

## 2014-03-23 DIAGNOSIS — M545 Low back pain, unspecified: Secondary | ICD-10-CM | POA: Diagnosis not present

## 2014-03-23 DIAGNOSIS — M5137 Other intervertebral disc degeneration, lumbosacral region: Secondary | ICD-10-CM | POA: Diagnosis not present

## 2014-03-23 DIAGNOSIS — M1610 Unilateral primary osteoarthritis, unspecified hip: Secondary | ICD-10-CM | POA: Diagnosis not present

## 2014-03-23 DIAGNOSIS — M47817 Spondylosis without myelopathy or radiculopathy, lumbosacral region: Secondary | ICD-10-CM | POA: Diagnosis not present

## 2014-04-05 ENCOUNTER — Other Ambulatory Visit: Payer: Self-pay | Admitting: Neurosurgery

## 2014-04-05 DIAGNOSIS — M4120 Other idiopathic scoliosis, site unspecified: Secondary | ICD-10-CM | POA: Diagnosis not present

## 2014-04-05 DIAGNOSIS — Z6830 Body mass index (BMI) 30.0-30.9, adult: Secondary | ICD-10-CM | POA: Diagnosis not present

## 2014-04-05 DIAGNOSIS — M4800 Spinal stenosis, site unspecified: Secondary | ICD-10-CM | POA: Diagnosis not present

## 2014-04-11 ENCOUNTER — Other Ambulatory Visit: Payer: Medicare Other

## 2014-04-13 ENCOUNTER — Ambulatory Visit
Admission: RE | Admit: 2014-04-13 | Discharge: 2014-04-13 | Disposition: A | Discharge status: Home / Self Care | Payer: Medicare Other | Source: Ambulatory Visit | Attending: Neurosurgery | Admitting: Neurosurgery

## 2014-04-13 DIAGNOSIS — M47817 Spondylosis without myelopathy or radiculopathy, lumbosacral region: Secondary | ICD-10-CM | POA: Diagnosis not present

## 2014-04-13 DIAGNOSIS — IMO0002 Reserved for concepts with insufficient information to code with codable children: Secondary | ICD-10-CM | POA: Diagnosis not present

## 2014-04-13 DIAGNOSIS — M4120 Other idiopathic scoliosis, site unspecified: Secondary | ICD-10-CM

## 2014-04-13 DIAGNOSIS — M48061 Spinal stenosis, lumbar region without neurogenic claudication: Secondary | ICD-10-CM | POA: Diagnosis not present

## 2014-04-13 DIAGNOSIS — M5137 Other intervertebral disc degeneration, lumbosacral region: Secondary | ICD-10-CM | POA: Diagnosis not present

## 2014-04-14 DIAGNOSIS — M4800 Spinal stenosis, site unspecified: Secondary | ICD-10-CM | POA: Diagnosis not present

## 2014-04-14 DIAGNOSIS — Z6829 Body mass index (BMI) 29.0-29.9, adult: Secondary | ICD-10-CM | POA: Diagnosis not present

## 2014-04-14 DIAGNOSIS — M4120 Other idiopathic scoliosis, site unspecified: Secondary | ICD-10-CM | POA: Diagnosis not present

## 2014-04-18 ENCOUNTER — Encounter: Payer: Self-pay | Admitting: Internal Medicine

## 2014-04-18 ENCOUNTER — Ambulatory Visit (INDEPENDENT_AMBULATORY_CARE_PROVIDER_SITE_OTHER): Payer: Medicare Other | Admitting: Internal Medicine

## 2014-04-18 VITALS — BP 115/75 | HR 65 | Temp 98.0°F | Wt 193.0 lb

## 2014-04-18 DIAGNOSIS — R1310 Dysphagia, unspecified: Secondary | ICD-10-CM

## 2014-04-18 DIAGNOSIS — R7309 Other abnormal glucose: Secondary | ICD-10-CM

## 2014-04-18 DIAGNOSIS — I10 Essential (primary) hypertension: Secondary | ICD-10-CM

## 2014-04-18 DIAGNOSIS — R7303 Prediabetes: Secondary | ICD-10-CM

## 2014-04-18 DIAGNOSIS — E785 Hyperlipidemia, unspecified: Secondary | ICD-10-CM

## 2014-04-18 DIAGNOSIS — R739 Hyperglycemia, unspecified: Secondary | ICD-10-CM | POA: Insufficient documentation

## 2014-04-18 DIAGNOSIS — L723 Sebaceous cyst: Secondary | ICD-10-CM

## 2014-04-18 LAB — BASIC METABOLIC PANEL
BUN: 22 mg/dL (ref 6–23)
CALCIUM: 9.3 mg/dL (ref 8.4–10.5)
CO2: 28 meq/L (ref 19–32)
CREATININE: 1 mg/dL (ref 0.4–1.5)
Chloride: 100 mEq/L (ref 96–112)
GFR: 77.87 mL/min (ref >60.00)
GLUCOSE: 84 mg/dL (ref 70–99)
Potassium: 4.3 mEq/L (ref 3.5–5.1)
SODIUM: 138 meq/L (ref 135–145)

## 2014-04-18 LAB — HEMOGLOBIN A1C: Hgb A1c MFr Bld: 6.1 % (ref 4.6–6.5)

## 2014-04-18 NOTE — Patient Instructions (Signed)
Get your blood work before you leave   Decrease hydralazine to 1 tablet twice a day  Continue checking your blood pressure,  be sure it is between 95/60 and 140/85. Ideal blood pressure is 120/80. If it is consistently higher or lower, let me know  Next visit is for a physical exam in 6 months ,fasting Please make an appointment
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
## 2014-04-18 NOTE — Progress Notes (Signed)
Pre visit review using our clinic review tool, if applicable. No additional management support is needed unless otherwise documented below in the visit note. 

## 2014-04-18 NOTE — Assessment & Plan Note (Addendum)
Over controlled, often morning BP drops to the 80s. Plan: Labs Decrease hydralazine from 3 times a day to twice a day, continue monitoring BPs

## 2014-04-18 NOTE — Assessment & Plan Note (Signed)
Labs

## 2014-04-18 NOTE — Assessment & Plan Note (Signed)
Currently on no medications States his wife is adamantly opposed  to Lipitor b/c personal experience , explained there is alternatives to such medication, stateshe is not ready to try medication. Plan: Recheck in 6 months

## 2014-04-18 NOTE — Assessment & Plan Note (Addendum)
Ongoing issue, occasionally cough when he swallows, has seen speech therapy before and knows aspiration precautions. Desires to see GI, referral to be entered

## 2014-04-19 ENCOUNTER — Telehealth: Payer: Self-pay | Admitting: Internal Medicine

## 2014-04-19 NOTE — Telephone Encounter (Signed)
Relevant patient education mailed to patient.  

## 2014-04-21 ENCOUNTER — Telehealth: Payer: Self-pay | Admitting: *Deleted

## 2014-04-21 NOTE — Telephone Encounter (Signed)
Spoke with patient regarding his message about his blood pressure dropping advised to call us back if b/p drops again or if having increase or decrease negative s/s . Pt states b/p was back to 117/68 from  28/78 according to his mychart message.

## 2014-04-22 ENCOUNTER — Other Ambulatory Visit: Payer: Self-pay | Admitting: Internal Medicine

## 2014-04-22 DIAGNOSIS — I10 Essential (primary) hypertension: Secondary | ICD-10-CM

## 2014-04-22 NOTE — Telephone Encounter (Signed)
Refill for apresoline sent to [facility]

## 2014-05-20 ENCOUNTER — Other Ambulatory Visit: Payer: Self-pay | Admitting: Internal Medicine

## 2014-05-20 DIAGNOSIS — I10 Essential (primary) hypertension: Secondary | ICD-10-CM

## 2014-05-20 DIAGNOSIS — H01029 Squamous blepharitis unspecified eye, unspecified eyelid: Secondary | ICD-10-CM | POA: Diagnosis not present

## 2014-05-20 DIAGNOSIS — H43819 Vitreous degeneration, unspecified eye: Secondary | ICD-10-CM | POA: Diagnosis not present

## 2014-05-20 DIAGNOSIS — H2510 Age-related nuclear cataract, unspecified eye: Secondary | ICD-10-CM | POA: Diagnosis not present

## 2014-05-20 DIAGNOSIS — H11159 Pinguecula, unspecified eye: Secondary | ICD-10-CM | POA: Diagnosis not present

## 2014-05-20 NOTE — Telephone Encounter (Signed)
Refill for HCTZ sent to [facility]

## 2014-05-21 ENCOUNTER — Telehealth: Payer: Self-pay | Admitting: Internal Medicine

## 2014-05-21 NOTE — Telephone Encounter (Signed)
Relevant patient education mailed to patient.  

## 2014-05-30 ENCOUNTER — Ambulatory Visit (INDEPENDENT_AMBULATORY_CARE_PROVIDER_SITE_OTHER): Payer: Medicare Other | Admitting: Internal Medicine

## 2014-05-30 ENCOUNTER — Encounter: Payer: Self-pay | Admitting: Internal Medicine

## 2014-05-30 VITALS — BP 102/74 | HR 66 | Ht 66.0 in | Wt 193.0 lb

## 2014-05-30 DIAGNOSIS — R1310 Dysphagia, unspecified: Secondary | ICD-10-CM

## 2014-05-30 DIAGNOSIS — R933 Abnormal findings on diagnostic imaging of other parts of digestive tract: Secondary | ICD-10-CM
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
## 2014-05-30 NOTE — Patient Instructions (Signed)

## 2014-06-14 DIAGNOSIS — M5126 Other intervertebral disc displacement, lumbar region: Secondary | ICD-10-CM | POA: Diagnosis not present

## 2014-06-14 DIAGNOSIS — M4800 Spinal stenosis, site unspecified: Secondary | ICD-10-CM | POA: Diagnosis not present

## 2014-06-14 DIAGNOSIS — Z6829 Body mass index (BMI) 29.0-29.9, adult: Secondary | ICD-10-CM | POA: Diagnosis not present

## 2014-06-14 DIAGNOSIS — M4120 Other idiopathic scoliosis, site unspecified: Secondary | ICD-10-CM | POA: Diagnosis not present

## 2014-06-20 ENCOUNTER — Other Ambulatory Visit: Payer: Self-pay | Admitting: Neurosurgery

## 2014-06-30 ENCOUNTER — Telehealth: Payer: Self-pay | Admitting: *Deleted

## 2014-06-30 ENCOUNTER — Other Ambulatory Visit: Payer: Self-pay | Admitting: Internal Medicine

## 2014-06-30 DIAGNOSIS — G609 Hereditary and idiopathic neuropathy, unspecified: Secondary | ICD-10-CM

## 2014-06-30 MED ORDER — GABAPENTIN 400 MG PO CAPS: 400.0000 mg | ORAL_CAPSULE | Freq: Three times a day (TID) | ORAL | Status: DC

## 2014-06-30 NOTE — Telephone Encounter (Signed)
Gabapentin 400 mg  Last OV- 04/18/14 Last refilled- 08/12/13 #270 / 3 rf  UDS- 10/18/13 LOW risk

## 2014-06-30 NOTE — Telephone Encounter (Signed)
rx [facility]ed [facility]

## 2014-06-30 NOTE — Telephone Encounter (Signed)
Ok x 1 year 

## 2014-07-06 ENCOUNTER — Ambulatory Visit (AMBULATORY_SURGERY_CENTER): Payer: Medicare Other | Admitting: Internal Medicine

## 2014-07-06 ENCOUNTER — Other Ambulatory Visit: Payer: Self-pay | Admitting: Internal Medicine

## 2014-07-06 ENCOUNTER — Encounter: Payer: Self-pay | Admitting: Internal Medicine

## 2014-07-06 VITALS — BP 148/86 | HR 57 | Temp 96.1°F | Resp 33 | Ht 66.0 in | Wt 193.0 lb

## 2014-07-06 DIAGNOSIS — I252 Old myocardial infarction: Secondary | ICD-10-CM | POA: Diagnosis not present

## 2014-07-06 DIAGNOSIS — K222 Esophageal obstruction: Secondary | ICD-10-CM

## 2014-07-06 DIAGNOSIS — I10 Essential (primary) hypertension: Secondary | ICD-10-CM | POA: Diagnosis not present

## 2014-07-06 DIAGNOSIS — I2510 Atherosclerotic heart disease of native coronary artery without angina pectoris: Secondary | ICD-10-CM | POA: Diagnosis not present

## 2014-07-06 DIAGNOSIS — F329 Major depressive disorder, single episode, unspecified: Secondary | ICD-10-CM | POA: Diagnosis not present

## 2014-07-06 DIAGNOSIS — K219 Gastro-esophageal reflux disease without esophagitis: Secondary | ICD-10-CM

## 2014-07-06 DIAGNOSIS — R1310 Dysphagia, unspecified: Secondary | ICD-10-CM

## 2014-07-06 DIAGNOSIS — F3289 Other specified depressive episodes: Secondary | ICD-10-CM | POA: Diagnosis not present

## 2014-07-06 DIAGNOSIS — G4733 Obstructive sleep apnea (adult) (pediatric): Secondary | ICD-10-CM | POA: Diagnosis not present

## 2014-07-06 DIAGNOSIS — IMO0001 Reserved for inherently not codable concepts without codable children: Secondary | ICD-10-CM | POA: Diagnosis not present

## 2014-07-06 HISTORY — PX: UPPER GASTROINTESTINAL ENDOSCOPY: SHX188

## 2014-07-06 MED ORDER — SODIUM CHLORIDE 0.9 % IV SOLN: 500.0000 mL | INTRAVENOUS | Status: DC

## 2014-07-06 MED ORDER — OMEPRAZOLE 40 MG PO CPDR: 40.0000 mg | DELAYED_RELEASE_CAPSULE | Freq: Every day | ORAL | Status: DC

## 2014-07-06 NOTE — Op Note (Signed)
[facility]  [address] [place], [id number]   ENDOSCOPY PROCEDURE REPORT  PATIENT: Christopher, Mckee  MR#: [id number] BIRTHDATE: 1941-02-13 , 73  yrs. old GENDER: Male ENDOSCOPIST: Eustace Quail, MD REFERRED BY:  .  Self / Office PROCEDURE DATE:  07/06/2014 PROCEDURE:  EGD, diagnostic and Maloney dilation of esophagus  -93 French ASA CLASS:     Class II INDICATIONS:  Dysphagia.   Therapeutic procedure. MEDICATIONS: MAC sedation, administered by CRNA and propofol (Diprivan) 100mg  IV TOPICAL ANESTHETIC: none  DESCRIPTION OF PROCEDURE: After the risks benefits and alternatives of the procedure were thoroughly explained, informed consent was obtained.  The LB [id number] and [id number] endoscope was introduced through the mouth and advanced to the second portion of the duodenum. Without limitations.  The instrument was slowly withdrawn as the mucosa was fully examined.    EXAM:The esophagus revealed a large caliber ring at the distal most portion.  The stomach was normal.  The duodenum was normal.   The esophagus revealed a large caliber ring at the distal most portion. The stomach was normal.  The duodenum was normal.  Retroflexed views revealed no abnormalities.     The scope was then withdrawn from the patient and the procedure completed. THERAPY: 43 French Maloney dilator passed into the esophagus blindly. No resistance or heme. Tolerated well  COMPLICATIONS: There were no complications. ENDOSCOPIC IMPRESSION: 1. Distal esophageal ring status post Maloney dilation 60 French 2. GERD.  RECOMMENDATIONS: 1.  Clear liquids until 5 PM, then soft foods rest of day.  Resume prior diet tomorrow. 2.  Prescribe omeprazole 40 mg daily; #30; 11 refills  REPEAT EXAM:  eSigned:  Eustace Quail, MD 07/06/2014 3:29 PM   CC:The Patient and Kathlene November, MD

## 2014-07-06 NOTE — Progress Notes (Signed)
Report to PACU, RN, vss, BBS= Clear.  

## 2014-07-06 NOTE — Patient Instructions (Signed)
YOU HAD AN ENDOSCOPIC PROCEDURE TODAY AT [facility]: Refer to the procedure report that was given to you for any specific questions about what was found during the examination.  If the procedure report does not answer your questions, please call your gastroenterologist to clarify.  If you requested that your care partner not be given the details of your procedure findings, then the procedure report has been included in a sealed envelope for you to review at your convenience later.  YOU SHOULD EXPECT: Some feelings of bloating in the abdomen. Passage of more gas than usual.  Walking can help get rid of the air that was put into your GI tract during the procedure and reduce the bloating. If you had a lower endoscopy (such as a colonoscopy or flexible sigmoidoscopy) you may notice spotting of blood in your stool or on the toilet paper. If you underwent a bowel prep for your procedure, then you may not have a normal bowel movement for a few days.  DIET:     FOLLOW DILATATION DIET TODAY   ACTIVITY: Your care partner should take you home directly after the procedure.  You should plan to take it easy, moving slowly for the rest of the day.  You can resume normal activity the day after the procedure however you should NOT DRIVE or use heavy machinery for 24 hours (because of the sedation medicines used during the test).    SYMPTOMS TO REPORT IMMEDIATELY: A gastroenterologist can be reached at any hour.  During normal business hours, 8:30 AM to 5:00 PM Monday through Friday, call [phone].  After hours and on weekends, please call the GI answering service at [phone] who will take a message and have the physician on call contact you.     Following upper endoscopy (EGD)  Vomiting of blood or coffee ground material  New chest pain or pain under the shoulder blades  Painful or persistently difficult swallowing  New shortness of breath  Fever of 100F or higher  Black,  tarry-looking stools  FOLLOW UP: If any biopsies were taken you will be contacted by phone or by letter within the next 1-3 weeks.  Call your gastroenterologist if you have not heard about the biopsies in 3 weeks.  Our staff will call the home number listed on your records the next business day following your procedure to check on you and address any questions or concerns that you may have at that time regarding the information given to you following your procedure. This is a courtesy call and so if there is no answer at the home number and we have not heard from you through the emergency physician on call, we will assume that you have returned to your regular daily activities without incident.  SIGNATURES/CONFIDENTIALITY: You and/or your care partner have signed paperwork which will be entered into your electronic medical record.  These signatures attest to the fact that that the information above on your After Visit Summary has been reviewed and is understood.  Full responsibility of the confidentiality of this discharge information lies with you and/or your care-partner.    DILATATION DIET TO FOLLOW TODAY GIVEN TO YOU   OMEPRAZOLE SENT IN TO YOUR PHARMACY

## 2014-07-07 ENCOUNTER — Telehealth: Payer: Self-pay | Admitting: *Deleted

## 2014-07-07 NOTE — Telephone Encounter (Signed)
  Follow up Call-  Call back number 07/06/2014 04/21/2012 04/01/2012 10/18/2011  Post procedure Call Back phone  # [phone]  Permission to leave phone message Yes Yes Yes -     Patient questions:  Do you have a fever, pain , or abdominal swelling? No. Pain Score  0 *  Have you tolerated food without any problems? Yes.    Have you been able to return to your normal activities? Yes.    Do you have any questions about your discharge instructions: Diet   No. Medications  No. Follow up visit  No.  Do you have questions or concerns about your Care? No.  Actions: * If pain score is 4 or above: No action needed, pain <4.

## 2014-07-16 ENCOUNTER — Encounter (HOSPITAL_COMMUNITY): Payer: Self-pay | Admitting: Emergency Medicine

## 2014-07-16 ENCOUNTER — Emergency Department (HOSPITAL_COMMUNITY)
Admission: EM | Admit: 2014-07-16 | Discharge: 2014-07-16 | Disposition: A | Discharge status: Home / Self Care | Payer: Medicare Other | Attending: Emergency Medicine | Admitting: Emergency Medicine

## 2014-07-16 ENCOUNTER — Emergency Department (HOSPITAL_COMMUNITY): Payer: Medicare Other

## 2014-07-16 DIAGNOSIS — H269 Unspecified cataract: Secondary | ICD-10-CM | POA: Insufficient documentation

## 2014-07-16 DIAGNOSIS — Z79899 Other long term (current) drug therapy: Secondary | ICD-10-CM | POA: Insufficient documentation

## 2014-07-16 DIAGNOSIS — R197 Diarrhea, unspecified: Secondary | ICD-10-CM | POA: Diagnosis not present

## 2014-07-16 DIAGNOSIS — G4730 Sleep apnea, unspecified: Secondary | ICD-10-CM | POA: Diagnosis not present

## 2014-07-16 DIAGNOSIS — R110 Nausea: Secondary | ICD-10-CM | POA: Diagnosis not present

## 2014-07-16 DIAGNOSIS — R112 Nausea with vomiting, unspecified: Secondary | ICD-10-CM | POA: Insufficient documentation

## 2014-07-16 DIAGNOSIS — F329 Major depressive disorder, single episode, unspecified: Secondary | ICD-10-CM | POA: Diagnosis not present

## 2014-07-16 DIAGNOSIS — I10 Essential (primary) hypertension: Secondary | ICD-10-CM | POA: Diagnosis not present

## 2014-07-16 DIAGNOSIS — Z87828 Personal history of other (healed) physical injury and trauma: Secondary | ICD-10-CM | POA: Insufficient documentation

## 2014-07-16 DIAGNOSIS — Z8701 Personal history of pneumonia (recurrent): Secondary | ICD-10-CM | POA: Diagnosis not present

## 2014-07-16 DIAGNOSIS — R1084 Generalized abdominal pain: Secondary | ICD-10-CM | POA: Insufficient documentation

## 2014-07-16 DIAGNOSIS — M1990 Unspecified osteoarthritis, unspecified site: Secondary | ICD-10-CM | POA: Insufficient documentation

## 2014-07-16 DIAGNOSIS — Z860100 Personal history of colon polyps, unspecified: Secondary | ICD-10-CM | POA: Diagnosis not present

## 2014-07-16 DIAGNOSIS — F0280 Dementia in other diseases classified elsewhere without behavioral disturbance: Secondary | ICD-10-CM | POA: Diagnosis not present

## 2014-07-16 DIAGNOSIS — M810 Age-related osteoporosis without current pathological fracture: Secondary | ICD-10-CM | POA: Insufficient documentation

## 2014-07-16 DIAGNOSIS — K219 Gastro-esophageal reflux disease without esophagitis: Secondary | ICD-10-CM | POA: Diagnosis not present

## 2014-07-16 DIAGNOSIS — Z880 Allergy status to penicillin: Secondary | ICD-10-CM | POA: Insufficient documentation

## 2014-07-16 DIAGNOSIS — Z87891 Personal history of nicotine dependence: Secondary | ICD-10-CM | POA: Diagnosis not present

## 2014-07-16 DIAGNOSIS — R42 Dizziness and giddiness: Secondary | ICD-10-CM | POA: Insufficient documentation

## 2014-07-16 DIAGNOSIS — G2581 Restless legs syndrome: Secondary | ICD-10-CM | POA: Insufficient documentation

## 2014-07-16 DIAGNOSIS — G309 Alzheimer's disease, unspecified: Secondary | ICD-10-CM | POA: Insufficient documentation

## 2014-07-16 DIAGNOSIS — F3289 Other specified depressive episodes: Secondary | ICD-10-CM | POA: Insufficient documentation

## 2014-07-16 DIAGNOSIS — K2970 Gastritis, unspecified, without bleeding: Secondary | ICD-10-CM | POA: Diagnosis not present

## 2014-07-16 DIAGNOSIS — I252 Old myocardial infarction: Secondary | ICD-10-CM | POA: Insufficient documentation

## 2014-07-16 LAB — URINALYSIS, ROUTINE W REFLEX MICROSCOPIC
BILIRUBIN URINE: NEGATIVE
Glucose, UA: NEGATIVE mg/dL
Hgb urine dipstick: NEGATIVE
KETONES UR: NEGATIVE mg/dL
Leukocytes, UA: NEGATIVE
NITRITE: NEGATIVE
PROTEIN: NEGATIVE mg/dL
SPECIFIC GRAVITY, URINE: 1.016 (ref 1.005–1.030)
UROBILINOGEN UA: 0.2 mg/dL (ref 0.0–1.0)
pH: 6 (ref 5.0–8.0)

## 2014-07-16 LAB — CBC WITH DIFFERENTIAL/PLATELET
BASOS PCT: 1 % (ref 0–1)
Basophils Absolute: 0.1 10*3/uL (ref 0.0–0.1)
Eosinophils Absolute: 0.4 10*3/uL (ref 0.0–0.7)
Eosinophils Relative: 4 % (ref 0–5)
HCT: 43 % (ref 39.0–52.0)
HEMOGLOBIN: 15 g/dL (ref 13.0–17.0)
Lymphocytes Relative: 16 % (ref 12–46)
Lymphs Abs: 1.4 10*3/uL (ref 0.7–4.0)
MCH: 31.4 pg (ref 26.0–34.0)
MCHC: 34.9 g/dL (ref 30.0–36.0)
MCV: 90.1 fL (ref 78.0–100.0)
MONO ABS: 0.6 10*3/uL (ref 0.1–1.0)
MONOS PCT: 7 % (ref 3–12)
NEUTROS ABS: 6.4 10*3/uL (ref 1.7–7.7)
Neutrophils Relative %: 72 % (ref 43–77)
Platelets: 229 10*3/uL (ref 150–400)
RBC: 4.77 MIL/uL (ref 4.22–5.81)
RDW: 12.5 % (ref 11.5–15.5)
WBC: 8.9 10*3/uL (ref 4.0–10.5)

## 2014-07-16 LAB — COMPREHENSIVE METABOLIC PANEL
ALBUMIN: 4 g/dL (ref 3.5–5.2)
ALT: 24 U/L (ref 0–53)
ANION GAP: 12 (ref 5–15)
AST: 28 U/L (ref 0–37)
Alkaline Phosphatase: 95 U/L (ref 39–117)
BILIRUBIN TOTAL: 0.3 mg/dL (ref 0.3–1.2)
BUN: 26 mg/dL — ABNORMAL HIGH (ref 6–23)
CHLORIDE: 102 meq/L (ref 96–112)
CO2: 26 mEq/L (ref 19–32)
CREATININE: 1.22 mg/dL (ref 0.50–1.35)
Calcium: 9.2 mg/dL (ref 8.4–10.5)
GFR, EST AFRICAN AMERICAN: 66 mL/min — AB (ref >90)
GFR, EST NON AFRICAN AMERICAN: 57 mL/min — AB (ref >90)
GLUCOSE: 111 mg/dL — AB (ref 70–99)
Potassium: 4.4 mEq/L (ref 3.7–5.3)
Sodium: 140 mEq/L (ref 137–147)
Total Protein: 6.8 g/dL (ref 6.0–8.3)

## 2014-07-16 LAB — TROPONIN I: Troponin I: <0.3 ng/mL (ref <0.30)

## 2014-07-16 LAB — LIPASE, BLOOD: Lipase: 59 U/L (ref 11–59)

## 2014-07-16 LAB — I-STAT CG4 LACTIC ACID, ED: LACTIC ACID, VENOUS: 1.04 mmol/L (ref 0.5–2.2)

## 2014-07-16 MED ORDER — IOHEXOL 300 MG/ML  SOLN
50.0000 mL | Freq: Once | INTRAMUSCULAR | Status: AC | PRN
  Administered 2014-07-16: 50 mL via ORAL

## 2014-07-16 MED ORDER — SODIUM CHLORIDE 0.9 % IV BOLUS (SEPSIS)
1000.0000 mL | Freq: Once | INTRAVENOUS | Status: AC
  Administered 2014-07-16: 1000 mL via INTRAVENOUS

## 2014-07-16 MED ORDER — ONDANSETRON HCL 4 MG/2ML IJ SOLN
4.0000 mg | Freq: Once | INTRAMUSCULAR | Status: DC
  Filled 2014-07-16: qty 2

## 2014-07-16 MED ORDER — IOHEXOL 300 MG/ML  SOLN
100.0000 mL | Freq: Once | INTRAMUSCULAR | Status: AC | PRN
  Administered 2014-07-16: 100 mL via INTRAVENOUS

## 2014-07-16 MED ORDER — HYDROMORPHONE HCL PF 1 MG/ML IJ SOLN: 0.5000 mg | Freq: Once | INTRAMUSCULAR | Status: DC

## 2014-07-16 MED ORDER — ONDANSETRON HCL 4 MG/2ML IJ SOLN
4.0000 mg | Freq: Once | INTRAMUSCULAR | Status: AC
  Administered 2014-07-16: 4 mg via INTRAVENOUS

## 2014-07-16 MED ORDER — ONDANSETRON 4 MG PO TBDP
ORAL_TABLET | ORAL | Status: DC
Start: 2014-07-16 — End: 2014-07-25

## 2014-07-16 NOTE — Discharge Instructions (Signed)
If you were given medicines take as directed.  If you are on coumadin or contraceptives realize their levels and effectiveness is altered by many different medicines.  If you have any reaction (rash, tongues swelling, other) to the medicines stop taking and see a physician.   Please follow up as directed and return to the ER or see a physician for new or worsening symptoms.  Thank you. Filed Vitals:   07/16/14 0145 07/16/14 0152 07/16/14 0303 07/16/14 0304  BP:  154/80  127/66  Pulse:  69 57 59  Temp:  97.5 F (36.4 C)    TempSrc:  Oral    Resp:  14 14 15   SpO2: 98% 99% 98% 99%

## 2014-07-16 NOTE — ED Notes (Signed)
Bed: OE42 Expected date:  Expected time:  Means of arrival:  Comments: EMS 73yo abd pain N / V

## 2014-07-16 NOTE — ED Notes (Signed)
Patient is having nausea and it will not go away.
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
## 2014-07-21 ENCOUNTER — Ambulatory Visit (INDEPENDENT_AMBULATORY_CARE_PROVIDER_SITE_OTHER): Payer: Medicare Other | Admitting: Medical

## 2014-07-21 VITALS — BP 115/60 | HR 64 | Temp 98.0°F | Wt 187.0 lb

## 2014-07-21 DIAGNOSIS — R197 Diarrhea, unspecified: Secondary | ICD-10-CM | POA: Diagnosis not present

## 2014-07-21 DIAGNOSIS — R141 Gas pain: Secondary | ICD-10-CM | POA: Diagnosis not present

## 2014-07-21 DIAGNOSIS — R143 Flatulence: Secondary | ICD-10-CM

## 2014-07-21 DIAGNOSIS — R140 Abdominal distension (gaseous): Secondary | ICD-10-CM

## 2014-07-21 DIAGNOSIS — R142 Eructation: Secondary | ICD-10-CM

## 2014-07-21 NOTE — Assessment & Plan Note (Signed)
Stool studie if reoccurs. Pt picked up kit today.

## 2014-07-21 NOTE — Progress Notes (Signed)
Pre visit review using our clinic review tool, if applicable. No additional management support is needed unless otherwise documented below in the visit note. 

## 2014-07-21 NOTE — Assessment & Plan Note (Signed)
Pt troponin, metabolic panel, cbc, lipase and ct abd/pelvis were unremarkable on review. Did not indicate any definitive diagnosis. Did not see hemocult in record so I did test today. Was neg. Decided if loose stools reoccur the get stool panel. If loose stools don't reoccur but he does not feel back to baseline by Monday then he will notify me and will refer to his GI. If pain worsens or changes the advised ED evalution.

## 2014-07-21 NOTE — Progress Notes (Signed)
   Subjective:    Patient ID: [name], male    DOB: 11-Aug-1941, 73 y.o.   MRN: [id number]  HPI Pt states he has history of hiatal hernia and had nissan fundoplication in 1607. Surgeon told him he would not be able to vomit in the future due to surgery. He states this is his only surgery. On Saturday he felt bloated and having dry heaves. He states his stomach was distended and mild sob. He went from ED to [facility] long. He had ct and states etiology of this was undetermined. They thought he may have had an obstruction that may have resolved. Pt does update me of a lot of injuries in the [employer].   Pt states he did have diarrhea for about 3.5 days before he went to ED. Pt states since discharge he has been going to bathroom intermittentlly. He has had a lot of flatulence. One fairly decent stools q day and then next will be loose. He states today not much loose stool since not eating much.  Pt has GI MD and he states several colonoscopies. [name] is his GI. Pt states his last colnoscopy was 2012.  Last saw Dr. Henrene Mckee 15th of July for dilation of esophugus.  Pt still feels nausea intermittently. But better. He ran out of zofran today.  Pt states one hour after eating luncheon late last week and he developed loose stools and bloating.   Review of Systems  Constitutional: Negative for fever, chills and fatigue.  HENT: Negative.   Respiratory: Negative for cough, choking and wheezing.   Cardiovascular: Negative for chest pain and palpitations.  Gastrointestinal: Positive for nausea and diarrhea. Negative for vomiting, abdominal pain, blood in stool, abdominal distention, anal bleeding and rectal pain.       Very fiant nausea. Maybe loose stools. Full descript see HOP.  Bloating no longer present.  Genitourinary: Negative.   Musculoskeletal: Negative.   Neurological: Negative.   Hematological: Negative for adenopathy. Does not bruise/bleed easily.  Psychiatric/Behavioral: Negative  for behavioral problems, dysphoric mood and agitation. The patient is not nervous/anxious.        Objective:   Physical Exam  General Appearance- Not in acute distress.  HEENT Eyes- Scleraeral/Conjuntiva-bilat- Not Yellow. Mouth & Throat- Normal.  Chest and Lung Exam Auscultation: Breath sounds:-Normal. Adventitious sounds:- No Adventitious sounds.  Cardiovascular Auscultation:Rythm - Regular. Heart Sounds -Normal heart sounds.  Abdomen Inspection:-Inspection Normal.  Palpation/Perucssion: Palpation and Percussion of the abdomen reveal- very faint  Tender above umbilicus only. He may have very small early umbilical hernia, noted on straight leg lift, No Rebound tenderness, No rigidity(Guarding) and No Palpable abdominal masses.  Liver:-Normal.  Spleen:- Normal.   Rectal Anorectal Exam: Stool - Hemoccult of stool  is Heme Negative. External - normal external exam. Internal - normal sphincter tone. No rectal mass.       Assessment & Plan:

## 2014-07-21 NOTE — Patient Instructions (Addendum)
Etiology of your abdomen pain the other day  is yet to be determined(I reviewed all labs and CT. Your hx of eating at luncheon prior to onset of loose stools and abdomen bloating make me want to get stool panel studies if any recurrent loose stools. Advised to ahead and start healthy diet. Could use probiotic otc as well. If abdomen pain worsens as before recommended ED evaluation. If you are not returning to baseline by Monday then will refer back to [name] as well.(or refer back at your request).  Note did not recommend immodium since his stool consistency varies and described as small and loose when has bm. Per pt ED speculated obstruction although ct abd/pelvis was negative.

## 2014-07-25 ENCOUNTER — Encounter (HOSPITAL_COMMUNITY): Payer: Self-pay | Admitting: Pharmacy Technician

## 2014-07-25 ENCOUNTER — Other Ambulatory Visit: Payer: Self-pay | Admitting: Internal Medicine

## 2014-07-29 NOTE — Pre-Procedure Instructions (Signed)
Conlan A Deshaies  07/29/2014   Your procedure is scheduled on:  Monday, Aug. 17th   Report to [facility] Admitting at 5:30 AM.  Call this number if you have problems the morning of surgery: [phone]   Remember:   Do not eat food or drink liquids after midnight Sunday.   Take these medicines the morning of surgery with A SIP OF WATER: Coreg, Omeprazole, Tramadol, Gabapentin   Do not wear jewelry - no rings or watches.  Do not wear lotions or colognes.  You may NOT wear deodorant.   Men may shave face and neck.  Do not bring valuables to the hospital.  [facility] is not responsible for any belongings or valuables.               Contacts, dentures or bridgework may not be worn into surgery.  Leave suitcase in the car. After surgery it may be brought to your room.  For patients admitted to the hospital, discharge time is determined by your treatment team.    Name and phone number of your driver:    Special Instructions: "Preparing for Surgery" instruction sheet.   Please read over the following fact sheets that you were given: Pain Booklet, Coughing and Deep Breathing, Blood Transfusion Information, MRSA Information and Surgical Site Infection Prevention

## 2014-07-31 ENCOUNTER — Other Ambulatory Visit: Payer: Self-pay | Admitting: Internal Medicine

## 2014-08-01 ENCOUNTER — Encounter (HOSPITAL_COMMUNITY)
Admission: RE | Admit: 2014-08-01 | Discharge: 2014-08-01 | Disposition: A | Discharge status: Home / Self Care | Payer: Medicare Other | Source: Ambulatory Visit | Attending: Neurosurgery | Admitting: Neurosurgery

## 2014-08-01 ENCOUNTER — Ambulatory Visit (HOSPITAL_COMMUNITY)
Admission: RE | Admit: 2014-08-01 | Discharge: 2014-08-01 | Disposition: A | Discharge status: Home / Self Care | Payer: Medicare Other | Source: Ambulatory Visit | Attending: Anesthesiology | Admitting: Anesthesiology

## 2014-08-01 ENCOUNTER — Encounter (HOSPITAL_COMMUNITY): Payer: Self-pay

## 2014-08-01 DIAGNOSIS — F3289 Other specified depressive episodes: Secondary | ICD-10-CM | POA: Diagnosis not present

## 2014-08-01 DIAGNOSIS — Z01818 Encounter for other preprocedural examination: Secondary | ICD-10-CM | POA: Insufficient documentation

## 2014-08-01 DIAGNOSIS — K449 Diaphragmatic hernia without obstruction or gangrene: Secondary | ICD-10-CM | POA: Insufficient documentation

## 2014-08-01 DIAGNOSIS — G2581 Restless legs syndrome: Secondary | ICD-10-CM | POA: Insufficient documentation

## 2014-08-01 DIAGNOSIS — Z87891 Personal history of nicotine dependence: Secondary | ICD-10-CM | POA: Insufficient documentation

## 2014-08-01 DIAGNOSIS — IMO0001 Reserved for inherently not codable concepts without codable children: Secondary | ICD-10-CM | POA: Diagnosis not present

## 2014-08-01 DIAGNOSIS — K219 Gastro-esophageal reflux disease without esophagitis: Secondary | ICD-10-CM | POA: Insufficient documentation

## 2014-08-01 DIAGNOSIS — K222 Esophageal obstruction: Secondary | ICD-10-CM | POA: Insufficient documentation

## 2014-08-01 DIAGNOSIS — F329 Major depressive disorder, single episode, unspecified: Secondary | ICD-10-CM | POA: Diagnosis not present

## 2014-08-01 DIAGNOSIS — E785 Hyperlipidemia, unspecified: Secondary | ICD-10-CM | POA: Insufficient documentation

## 2014-08-01 DIAGNOSIS — G4733 Obstructive sleep apnea (adult) (pediatric): Secondary | ICD-10-CM | POA: Insufficient documentation

## 2014-08-01 HISTORY — DX: Unspecified hearing loss, unspecified ear: H91.90

## 2014-08-01 HISTORY — DX: Failed or difficult intubation, initial encounter: T88.4XXA

## 2014-08-01 HISTORY — DX: Personal history of other diseases of the digestive system: Z98.890

## 2014-08-01 HISTORY — DX: Personal history of other diseases of the digestive system: Z87.19

## 2014-08-01 HISTORY — DX: Unspecified cataract: H26.9

## 2014-08-01 LAB — COMPREHENSIVE METABOLIC PANEL
ALT: 22 U/L (ref 0–53)
ANION GAP: 10 (ref 5–15)
AST: 25 U/L (ref 0–37)
Albumin: 4 g/dL (ref 3.5–5.2)
Alkaline Phosphatase: 98 U/L (ref 39–117)
BUN: 18 mg/dL (ref 6–23)
CALCIUM: 9.4 mg/dL (ref 8.4–10.5)
CO2: 30 mEq/L (ref 19–32)
CREATININE: 1.08 mg/dL (ref 0.50–1.35)
Chloride: 102 mEq/L (ref 96–112)
GFR calc Af Amer: 77 mL/min — ABNORMAL LOW (ref >90)
GFR, EST NON AFRICAN AMERICAN: 66 mL/min — AB (ref >90)
Glucose, Bld: 93 mg/dL (ref 70–99)
Potassium: 5.2 mEq/L (ref 3.7–5.3)
Sodium: 142 mEq/L (ref 137–147)
Total Bilirubin: 0.4 mg/dL (ref 0.3–1.2)
Total Protein: 6.6 g/dL (ref 6.0–8.3)

## 2014-08-01 LAB — TYPE AND SCREEN
ABO/RH(D): O POS
ANTIBODY SCREEN: NEGATIVE

## 2014-08-01 LAB — SURGICAL PCR SCREEN
MRSA, PCR: NEGATIVE
Staphylococcus aureus: NEGATIVE

## 2014-08-01 LAB — CBC
HCT: 42 % (ref 39.0–52.0)
Hemoglobin: 14.5 g/dL (ref 13.0–17.0)
MCH: 31.5 pg (ref 26.0–34.0)
MCHC: 34.5 g/dL (ref 30.0–36.0)
MCV: 91.1 fL (ref 78.0–100.0)
PLATELETS: 255 10*3/uL (ref 150–400)
RBC: 4.61 MIL/uL (ref 4.22–5.81)
RDW: 12.5 % (ref 11.5–15.5)
WBC: 9.5 10*3/uL (ref 4.0–10.5)

## 2014-08-01 NOTE — Telephone Encounter (Signed)
Will get staff to call to see if he ever turned in stool studies? See status of pending labs.

## 2014-08-01 NOTE — Progress Notes (Signed)
Anesthesia PAT Evaluation:  Patient is a 73 year old male scheduled for L1-2, L2-3, L3-4 PLIF on 08/08/14 by Dr. Saintclair Halsted.  History includes former smoker, MI in 2002 "suspected but not confirmed" (he reports was elevated in 2002 at [facility] and ruled out for MI and was diagnosed with sleep paralysis which resolved with medication adjustment; no definite scar or ischemia by stress echo 04/2011), enlarged heart (mild LVH by 04/2011 echo), PAD (> 60% right renal artery stenosis and 1-59% left RAS, < 50% ICA stenosis '12 with < 25% RICA/RECA stenosis by MRA '13, "25% two arteries in head" (right MCA and right PCA; 09/2012 MRA), RLS, fibromyalgia, GERD, hiatal hernia, esophageal stricture s/p multiple dilations with continued dysphagia, Nissen fundoplication '11, decreased hearing, HLD, OSA with history of uvoloplasty (CPAP intolerant due to "flash backs"), 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 Alzheimer's disease is listed, but he denies. PCP is Dr. Kathlene November.  Cardiologist is Dr. Fletcher Anon who sees him for renal artery stenosis, last appointment 10/2013 with one year follow-up recommended. He was aware that patient may require another back surgery. GI is Dr. [name].  He was just seen in the ED on 07/16/14 for nausea and bloating with associated SOB.  He ruled out for MI and abdominal CT showed no acute abnormality.  He felt a epigastric "pop" and then his symptoms resolved.  There was question if he had a temporary volvulus or temporary incarcerated umbilical hernia.  He was evaluated at his PCP office since by S[place], PA-C.  In regards to his anesthesia history, he denies being told that he was a difficult intubation although does have limited neck mobility making him a POTENTIAL DIFFICULT INTUBATION. Anesthesia records from 12/11/11 indicated Grade 1, Miller 2 used to place a 7.5 mm ETT with  direct visualization.  EKG on 07/16/14 showed: SR, borderline IVCD, poor baseline.  Cannot rule out inferior infarct (age undetermined). Inferior Q wave in lead III and to a lesser extent in aVF have been intermittently present on EKGs dating back to at least 01/17/10. He reports a period of time when he was experiencing a dull ache in his chest in 2013. He reports discussing it with his physicians, but no testing ordered at that time and symptoms eventually resolved.  He has not had any further symptoms in > 1 year.  He has chronic mild left ankle edema.  He is considered "90% disabled" due to his limited mobility related to decreased strength in all of his extremities and his back pain.  He continues to play golf, but rides the golf cart.  He rides a tractor to do yard work. He was able to walk from the hospital entrance to PAT.  He denies SOB at rest or exertional dyspnea with this level of activity.    Echo on 05/16/11 showed: - Left ventricle: The cavity size was normal. Wall thickness was increased in a pattern of mild LVH. Systolic function was vigorous. The estimated ejection fraction was in the range of 65% to 70%. Wall motion was normal; there were no regional wall motion abnormalities. Features are consistent with a pseudonormal left ventricular filling pattern, with concomitant abnormal relaxation and increased filling pressure (grade 2 diastolic dysfunction). - Pulmonary arteries: PA peak pressure: 41mm Hg (S). - Valves: Trivial mitral regurgitation. Mild pulmonic regurgitation. Trivial tricuspid regurgitation.  Stress echo on 05/03/11 showed: With stress there  are nonspecific ST changes. The echo images reveal normal increase in thickening and contractility in all segments. There is decrease in cavity size in all views. This study shows no definite sign of scar or ischemia.  On exam, he does have some limitation in side to side neck movement and to a lesser extent with extension (reports  pain if he tries to extend neck too far).  Heart RRR, no murmur noted. Lungs clear throughout.  No carotid bruit noted.  Mild left ankle edema.  MRI of the brain without contrast on 10/10/12 was normal. MRA of the head 10/10/2012 demonstrated: Mildly abnormal MRA head (without contrast) demonstrating mild atherosclerosis in the right MCA and right PCA distal branches. MRA of the neck (with and without contrast) showed the right internal and external carotid arteries have mild stenosis (< 25%) each at the carotid bifurcation. The left vertebral artery origin poorly visualized, may be due to atherosclerosis or artifact. Limited views of intercranial vasculature are unremarkable.    Renal duplex on 10/28/13 showed: Normal caliber abdominal aorta. Normal bilateral kidney size. Stable greater than 60% right renal artery stenosis. Stable 1-59% left renal artery stenosis.  CXR on 08/01/14 showed: No active cardiopulmonary disease.  Preoperative labs pending.  Cr 1.08. CBC WNL.    Patient with PAD history, but no specific CAD history.  Previous stress echo in 2012 was WNL.  EKG appears stable. Denies any CV symptoms. He is being followed by Dr. Fletcher Anon. We discussed possibility of need for specialized anesthesia equipment due to limited neck mobility.  He is comfortable talking further with his assigned anesthesiologist on the day of surgery.  Above reviewed with anesthesiologist Dr. Glennon Mac. If no acute changes then it is anticipated that she can proceed as planned.  George Hugh [facility] Short Stay Center/Anesthesiology Phone [phone] 08/01/2014 5:28 PM

## 2014-08-01 NOTE — Telephone Encounter (Signed)
Pt was told if no longer having diarrhea he does not have to give stool studies / pt states been having solid bowel movements and feeling better.

## 2014-08-07 MED ORDER — DEXAMETHASONE SODIUM PHOSPHATE 10 MG/ML IJ SOLN
10.0000 mg | INTRAMUSCULAR | Status: AC
Start: 2014-08-08 — End: 2014-08-08
  Administered 2014-08-08: 10 mg via INTRAVENOUS
  Filled 2014-08-07: qty 1

## 2014-08-07 MED ORDER — VANCOMYCIN HCL IN DEXTROSE 1-5 GM/200ML-% IV SOLN
1000.0000 mg | INTRAVENOUS | Status: AC
  Administered 2014-08-08: 1000 mg via INTRAVENOUS
  Filled 2014-08-07: qty 200

## 2014-08-08 ENCOUNTER — Encounter (HOSPITAL_COMMUNITY)
Admission: RE | Disposition: A | Discharge status: Home / Self Care | Payer: Medicare Other | Source: Ambulatory Visit | Attending: Neurosurgery

## 2014-08-08 ENCOUNTER — Inpatient Hospital Stay (HOSPITAL_COMMUNITY)
Admission: RE | Admit: 2014-08-08 | Discharge: 2014-08-15 | DRG: 457 | Disposition: A | Discharge status: Home / Self Care | Payer: Medicare Other | Source: Ambulatory Visit | Attending: Neurosurgery | Admitting: Neurosurgery

## 2014-08-08 ENCOUNTER — Encounter (HOSPITAL_COMMUNITY): Payer: Self-pay | Admitting: *Deleted

## 2014-08-08 ENCOUNTER — Inpatient Hospital Stay (HOSPITAL_COMMUNITY): Payer: Medicare Other | Admitting: Anesthesiology

## 2014-08-08 ENCOUNTER — Inpatient Hospital Stay (HOSPITAL_COMMUNITY): Payer: Medicare Other

## 2014-08-08 ENCOUNTER — Encounter (HOSPITAL_COMMUNITY): Payer: Medicare Other | Admitting: Vascular Surgery

## 2014-08-08 DIAGNOSIS — M47817 Spondylosis without myelopathy or radiculopathy, lumbosacral region: Secondary | ICD-10-CM | POA: Diagnosis present

## 2014-08-08 DIAGNOSIS — M4120 Other idiopathic scoliosis, site unspecified: Principal | ICD-10-CM | POA: Diagnosis present

## 2014-08-08 DIAGNOSIS — I10 Essential (primary) hypertension: Secondary | ICD-10-CM | POA: Diagnosis present

## 2014-08-08 DIAGNOSIS — IMO0001 Reserved for inherently not codable concepts without codable children: Secondary | ICD-10-CM | POA: Diagnosis not present

## 2014-08-08 DIAGNOSIS — Y9210 Unspecified residential institution as the place of occurrence of the external cause: Secondary | ICD-10-CM | POA: Diagnosis not present

## 2014-08-08 DIAGNOSIS — Z981 Arthrodesis status: Secondary | ICD-10-CM | POA: Diagnosis not present

## 2014-08-08 DIAGNOSIS — M419 Scoliosis, unspecified: Secondary | ICD-10-CM | POA: Diagnosis present

## 2014-08-08 DIAGNOSIS — Z87891 Personal history of nicotine dependence: Secondary | ICD-10-CM | POA: Diagnosis not present

## 2014-08-08 DIAGNOSIS — Z79899 Other long term (current) drug therapy: Secondary | ICD-10-CM | POA: Diagnosis not present

## 2014-08-08 DIAGNOSIS — G4730 Sleep apnea, unspecified: Secondary | ICD-10-CM | POA: Diagnosis present

## 2014-08-08 DIAGNOSIS — K219 Gastro-esophageal reflux disease without esophagitis: Secondary | ICD-10-CM | POA: Diagnosis present

## 2014-08-08 DIAGNOSIS — Y831 Surgical operation with implant of artificial internal device as the cause of abnormal reaction of the patient, or of later complication, without mention of misadventure at the time of the procedure: Secondary | ICD-10-CM | POA: Diagnosis not present

## 2014-08-08 DIAGNOSIS — M79609 Pain in unspecified limb: Secondary | ICD-10-CM | POA: Diagnosis not present

## 2014-08-08 DIAGNOSIS — M519 Unspecified thoracic, thoracolumbar and lumbosacral intervertebral disc disorder: Secondary | ICD-10-CM | POA: Diagnosis not present

## 2014-08-08 DIAGNOSIS — M542 Cervicalgia: Secondary | ICD-10-CM | POA: Diagnosis not present

## 2014-08-08 DIAGNOSIS — M4180 Other forms of scoliosis, site unspecified: Secondary | ICD-10-CM | POA: Diagnosis not present

## 2014-08-08 DIAGNOSIS — K2100 Gastro-esophageal reflux disease with esophagitis, without bleeding: Secondary | ICD-10-CM | POA: Diagnosis not present

## 2014-08-08 DIAGNOSIS — K929 Disease of digestive system, unspecified: Secondary | ICD-10-CM | POA: Diagnosis not present

## 2014-08-08 DIAGNOSIS — M48061 Spinal stenosis, lumbar region without neurogenic claudication: Secondary | ICD-10-CM | POA: Diagnosis not present

## 2014-08-08 DIAGNOSIS — K560 Paralytic ileus: Secondary | ICD-10-CM | POA: Diagnosis not present

## 2014-08-08 DIAGNOSIS — J9819 Other pulmonary collapse: Secondary | ICD-10-CM | POA: Diagnosis not present

## 2014-08-08 HISTORY — PX: LUMBAR FUSION: SHX111

## 2014-08-08 SURGERY — POSTERIOR LUMBAR FUSION 3 LEVEL
Anesthesia: General | Site: Back

## 2014-08-08 MED ORDER — SODIUM CHLORIDE 0.9 % IJ SOLN
3.0000 mL | Freq: Two times a day (BID) | INTRAMUSCULAR | Status: DC
  Administered 2014-08-09 – 2014-08-14 (×8): 3 mL via INTRAVENOUS

## 2014-08-08 MED ORDER — EPHEDRINE SULFATE 50 MG/ML IJ SOLN
INTRAMUSCULAR | Status: AC
  Filled 2014-08-08: qty 1

## 2014-08-08 MED ORDER — SODIUM CHLORIDE 0.9 % IJ SOLN
INTRAMUSCULAR | Status: AC
  Filled 2014-08-08: qty 10

## 2014-08-08 MED ORDER — SODIUM CHLORIDE 0.9 % IV SOLN: 250.0000 mL | INTRAVENOUS | Status: DC

## 2014-08-08 MED ORDER — VANCOMYCIN HCL IN DEXTROSE 1-5 GM/200ML-% IV SOLN
1000.0000 mg | Freq: Once | INTRAVENOUS | Status: AC
  Administered 2014-08-08: 1000 mg via INTRAVENOUS
  Filled 2014-08-08: qty 200

## 2014-08-08 MED ORDER — ONDANSETRON HCL 4 MG/2ML IJ SOLN
INTRAMUSCULAR | Status: AC
  Filled 2014-08-08: qty 2

## 2014-08-08 MED ORDER — ONDANSETRON HCL 4 MG/2ML IJ SOLN: 4.0000 mg | INTRAMUSCULAR | Status: DC | PRN

## 2014-08-08 MED ORDER — ACETAMINOPHEN 325 MG PO TABS
650.0000 mg | ORAL_TABLET | ORAL | Status: DC | PRN
  Filled 2014-08-08: qty 2

## 2014-08-08 MED ORDER — GLYCOPYRROLATE 0.2 MG/ML IJ SOLN
INTRAMUSCULAR | Status: AC
  Filled 2014-08-08: qty 3

## 2014-08-08 MED ORDER — LACTATED RINGERS IV SOLN
INTRAVENOUS | Status: DC | PRN
  Administered 2014-08-08 (×3): via INTRAVENOUS

## 2014-08-08 MED ORDER — PANTOPRAZOLE SODIUM 40 MG PO TBEC
40.0000 mg | DELAYED_RELEASE_TABLET | Freq: Every day | ORAL | Status: DC
  Administered 2014-08-09 – 2014-08-12 (×4): 40 mg via ORAL
  Filled 2014-08-08 (×4): qty 1

## 2014-08-08 MED ORDER — CYCLOBENZAPRINE HCL 10 MG PO TABS
10.0000 mg | ORAL_TABLET | Freq: Three times a day (TID) | ORAL | Status: DC | PRN
  Administered 2014-08-08 – 2014-08-14 (×4): 10 mg via ORAL
  Filled 2014-08-08 (×5): qty 1

## 2014-08-08 MED ORDER — HYDRALAZINE HCL 10 MG PO TABS
10.0000 mg | ORAL_TABLET | Freq: Every day | ORAL | Status: DC
  Administered 2014-08-08 – 2014-08-15 (×6): 10 mg via ORAL
  Filled 2014-08-08 (×6): qty 1

## 2014-08-08 MED ORDER — PROPOFOL 10 MG/ML IV BOLUS
INTRAVENOUS | Status: DC | PRN
  Administered 2014-08-08: 120 mg via INTRAVENOUS

## 2014-08-08 MED ORDER — SUCCINYLCHOLINE CHLORIDE 20 MG/ML IJ SOLN
INTRAMUSCULAR | Status: AC
  Filled 2014-08-08: qty 1

## 2014-08-08 MED ORDER — LIDOCAINE-EPINEPHRINE 1 %-1:100000 IJ SOLN
INTRAMUSCULAR | Status: DC | PRN
  Administered 2014-08-08: 10 mL

## 2014-08-08 MED ORDER — DEXTROSE 5 % IV SOLN
10.0000 mg | INTRAVENOUS | Status: DC | PRN
  Administered 2014-08-08: 25 ug/min via INTRAVENOUS

## 2014-08-08 MED ORDER — IRBESARTAN 150 MG PO TABS
75.0000 mg | ORAL_TABLET | Freq: Every day | ORAL | Status: DC
  Administered 2014-08-08 – 2014-08-15 (×5): 75 mg via ORAL
  Filled 2014-08-08 (×7): qty 0.5

## 2014-08-08 MED ORDER — FENTANYL CITRATE 0.05 MG/ML IJ SOLN
INTRAMUSCULAR | Status: AC
  Filled 2014-08-08: qty 5

## 2014-08-08 MED ORDER — GLYCOPYRROLATE 0.2 MG/ML IJ SOLN
INTRAMUSCULAR | Status: DC | PRN
  Administered 2014-08-08: 0.6 mg via INTRAVENOUS

## 2014-08-08 MED ORDER — PHENOL 1.4 % MT LIQD: 1.0000 | OROMUCOSAL | Status: DC | PRN

## 2014-08-08 MED ORDER — ONDANSETRON 4 MG PO TBDP: 4.0000 mg | ORAL_TABLET | ORAL | Status: DC | PRN

## 2014-08-08 MED ORDER — ALUM & MAG HYDROXIDE-SIMETH 200-200-20 MG/5ML PO SUSP: 30.0000 mL | Freq: Four times a day (QID) | ORAL | Status: DC | PRN

## 2014-08-08 MED ORDER — OXYCODONE HCL 5 MG PO TABS
ORAL_TABLET | ORAL | Status: AC
  Administered 2014-08-08: 5 mg via ORAL
  Filled 2014-08-08: qty 1

## 2014-08-08 MED ORDER — HYDROMORPHONE HCL PF 1 MG/ML IJ SOLN
0.2500 mg | INTRAMUSCULAR | Status: DC | PRN
  Administered 2014-08-08: 0.25 mg via INTRAVENOUS
  Administered 2014-08-08: 0.5 mg via INTRAVENOUS

## 2014-08-08 MED ORDER — CYCLOBENZAPRINE HCL 10 MG PO TABS
ORAL_TABLET | ORAL | Status: AC
  Filled 2014-08-08: qty 1

## 2014-08-08 MED ORDER — TRAMADOL HCL 50 MG PO TABS
50.0000 mg | ORAL_TABLET | Freq: Four times a day (QID) | ORAL | Status: DC | PRN
  Administered 2014-08-10: 50 mg via ORAL
  Filled 2014-08-08 (×2): qty 1

## 2014-08-08 MED ORDER — CARVEDILOL 12.5 MG PO TABS
25.0000 mg | ORAL_TABLET | Freq: Two times a day (BID) | ORAL | Status: DC
  Administered 2014-08-08 – 2014-08-15 (×10): 25 mg via ORAL
  Filled 2014-08-08 (×12): qty 2

## 2014-08-08 MED ORDER — BUPIVACAINE HCL (PF) 0.25 % IJ SOLN
INTRAMUSCULAR | Status: DC | PRN
  Administered 2014-08-08: 10 mL

## 2014-08-08 MED ORDER — THROMBIN 20000 UNITS EX SOLR
CUTANEOUS | Status: DC | PRN
  Administered 2014-08-08 (×2): via TOPICAL

## 2014-08-08 MED ORDER — ONDANSETRON HCL 4 MG/2ML IJ SOLN
INTRAMUSCULAR | Status: DC | PRN
  Administered 2014-08-08: 4 mg via INTRAVENOUS

## 2014-08-08 MED ORDER — ROCURONIUM BROMIDE 100 MG/10ML IV SOLN
INTRAVENOUS | Status: DC | PRN
  Administered 2014-08-08 (×3): 20 mg via INTRAVENOUS
  Administered 2014-08-08: 50 mg via INTRAVENOUS
  Administered 2014-08-08: 10 mg via INTRAVENOUS

## 2014-08-08 MED ORDER — PROMETHAZINE HCL 25 MG/ML IJ SOLN: 6.2500 mg | INTRAMUSCULAR | Status: DC | PRN

## 2014-08-08 MED ORDER — NEOSTIGMINE METHYLSULFATE 10 MG/10ML IV SOLN
INTRAVENOUS | Status: DC | PRN
  Administered 2014-08-08: 4 mg via INTRAVENOUS

## 2014-08-08 MED ORDER — HYDROMORPHONE HCL 2 MG PO TABS
2.0000 mg | ORAL_TABLET | ORAL | Status: DC | PRN
  Administered 2014-08-08 – 2014-08-15 (×22): 2 mg via ORAL
  Filled 2014-08-08 (×23): qty 1

## 2014-08-08 MED ORDER — HYDROCHLOROTHIAZIDE 12.5 MG PO CAPS
12.5000 mg | ORAL_CAPSULE | Freq: Every day | ORAL | Status: DC
  Administered 2014-08-08 – 2014-08-15 (×6): 12.5 mg via ORAL
  Filled 2014-08-08 (×6): qty 1

## 2014-08-08 MED ORDER — NEOSTIGMINE METHYLSULFATE 10 MG/10ML IV SOLN
INTRAVENOUS | Status: AC
  Filled 2014-08-08: qty 1

## 2014-08-08 MED ORDER — LIDOCAINE HCL (CARDIAC) 20 MG/ML IV SOLN
INTRAVENOUS | Status: AC
  Filled 2014-08-08: qty 5

## 2014-08-08 MED ORDER — DIPHENHYDRAMINE HCL 50 MG/ML IJ SOLN
INTRAMUSCULAR | Status: AC
  Filled 2014-08-08: qty 1

## 2014-08-08 MED ORDER — LIDOCAINE HCL (CARDIAC) 20 MG/ML IV SOLN
INTRAVENOUS | Status: DC | PRN
  Administered 2014-08-08: 30 mg via INTRAVENOUS

## 2014-08-08 MED ORDER — ROCURONIUM BROMIDE 50 MG/5ML IV SOLN
INTRAVENOUS | Status: AC
Start: 2014-08-08 — End: 2014-08-08
  Filled 2014-08-08: qty 1

## 2014-08-08 MED ORDER — MEPERIDINE HCL 25 MG/ML IJ SOLN: 6.2500 mg | INTRAMUSCULAR | Status: DC | PRN

## 2014-08-08 MED ORDER — GABAPENTIN 400 MG PO CAPS
400.0000 mg | ORAL_CAPSULE | Freq: Three times a day (TID) | ORAL | Status: DC
  Administered 2014-08-08 – 2014-08-15 (×20): 400 mg via ORAL
  Filled 2014-08-08: qty 1
  Filled 2014-08-08 (×2): qty 4
  Filled 2014-08-08: qty 1
  Filled 2014-08-08: qty 4
  Filled 2014-08-08 (×3): qty 1
  Filled 2014-08-08: qty 4
  Filled 2014-08-08 (×13): qty 1

## 2014-08-08 MED ORDER — MIDAZOLAM HCL 2 MG/2ML IJ SOLN: 0.5000 mg | Freq: Once | INTRAMUSCULAR | Status: DC | PRN

## 2014-08-08 MED ORDER — FENTANYL CITRATE 0.05 MG/ML IJ SOLN
INTRAMUSCULAR | Status: DC | PRN
  Administered 2014-08-08: 50 ug via INTRAVENOUS
  Administered 2014-08-08: 200 ug via INTRAVENOUS
  Administered 2014-08-08: 100 ug via INTRAVENOUS
  Administered 2014-08-08: 50 ug via INTRAVENOUS
  Administered 2014-08-08: 100 ug via INTRAVENOUS

## 2014-08-08 MED ORDER — 0.9 % SODIUM CHLORIDE (POUR BTL) OPTIME
TOPICAL | Status: DC | PRN
  Administered 2014-08-08: 1000 mL

## 2014-08-08 MED ORDER — MENTHOL 3 MG MT LOZG: 1.0000 | LOZENGE | OROMUCOSAL | Status: DC | PRN

## 2014-08-08 MED ORDER — ROCURONIUM BROMIDE 50 MG/5ML IV SOLN
INTRAVENOUS | Status: AC
  Filled 2014-08-08: qty 1

## 2014-08-08 MED ORDER — PROPOFOL 10 MG/ML IV BOLUS
INTRAVENOUS | Status: AC
  Filled 2014-08-08: qty 20

## 2014-08-08 MED ORDER — DOCUSATE SODIUM 100 MG PO CAPS
100.0000 mg | ORAL_CAPSULE | Freq: Two times a day (BID) | ORAL | Status: DC
  Administered 2014-08-08 – 2014-08-15 (×10): 100 mg via ORAL
  Filled 2014-08-08 (×13): qty 1

## 2014-08-08 MED ORDER — OLOPATADINE HCL 0.1 % OP SOLN
1.0000 [drp] | Freq: Two times a day (BID) | OPHTHALMIC | Status: DC
  Administered 2014-08-08 – 2014-08-15 (×8): 1 [drp] via OPHTHALMIC
  Filled 2014-08-08: qty 5

## 2014-08-08 MED ORDER — SODIUM CHLORIDE 0.9 % IR SOLN
Status: DC | PRN
  Administered 2014-08-08: 08:00:00

## 2014-08-08 MED ORDER — HYDROMORPHONE HCL PF 1 MG/ML IJ SOLN
INTRAMUSCULAR | Status: AC
  Administered 2014-08-08: 0.25 mg via INTRAVENOUS
  Filled 2014-08-08: qty 1

## 2014-08-08 MED ORDER — MIDAZOLAM HCL 2 MG/2ML IJ SOLN
INTRAMUSCULAR | Status: AC
  Filled 2014-08-08: qty 2

## 2014-08-08 MED ORDER — SODIUM CHLORIDE 0.9 % IV SOLN
INTRAVENOUS | Status: DC | PRN
  Administered 2014-08-08: 10:00:00 via INTRAVENOUS

## 2014-08-08 MED ORDER — SODIUM CHLORIDE 0.9 % IJ SOLN: 3.0000 mL | INTRAMUSCULAR | Status: DC | PRN

## 2014-08-08 MED ORDER — OXYCODONE HCL 5 MG/5ML PO SOLN: 5.0000 mg | Freq: Once | ORAL | Status: AC | PRN

## 2014-08-08 MED ORDER — MIDAZOLAM HCL 5 MG/5ML IJ SOLN
INTRAMUSCULAR | Status: DC | PRN
  Administered 2014-08-08: 1 mg via INTRAVENOUS

## 2014-08-08 MED ORDER — OXYCODONE HCL 5 MG PO TABS
5.0000 mg | ORAL_TABLET | Freq: Once | ORAL | Status: AC | PRN
  Administered 2014-08-08: 5 mg via ORAL

## 2014-08-08 MED ORDER — HYDROMORPHONE HCL PF 1 MG/ML IJ SOLN
INTRAMUSCULAR | Status: AC
  Filled 2014-08-08: qty 1

## 2014-08-08 MED ORDER — HYDROMORPHONE HCL PF 1 MG/ML IJ SOLN
INTRAMUSCULAR | Status: DC | PRN
  Administered 2014-08-08 (×2): 0.5 mg via INTRAVENOUS

## 2014-08-08 MED ORDER — ACETAMINOPHEN 650 MG RE SUPP: 650.0000 mg | RECTAL | Status: DC | PRN

## 2014-08-08 MED ORDER — PHENYLEPHRINE 40 MCG/ML (10ML) SYRINGE FOR IV PUSH (FOR BLOOD PRESSURE SUPPORT)
PREFILLED_SYRINGE | INTRAVENOUS | Status: AC
  Filled 2014-08-08: qty 10

## 2014-08-08 MED ORDER — DIPHENHYDRAMINE HCL 50 MG/ML IJ SOLN
INTRAMUSCULAR | Status: DC | PRN
  Administered 2014-08-08: 6.25 mg via INTRAVENOUS

## 2014-08-08 MED FILL — Sodium Chloride Irrigation Soln 0.9%: Qty: 1000 | Status: AC

## 2014-08-08 MED FILL — Heparin Sodium (Porcine) Inj 1000 Unit/ML: INTRAMUSCULAR | Qty: 30 | Status: AC

## 2014-08-08 MED FILL — Sodium Chloride IV Soln 0.9%: INTRAVENOUS | Qty: 1000 | Status: AC

## 2014-08-08 SURGICAL SUPPLY — 81 items
ADH SKN CLS APL DERMABOND .7 (GAUZE/BANDAGES/DRESSINGS) ×1
ADH SKN CLS LQ APL DERMABOND (GAUZE/BANDAGES/DRESSINGS) ×1
ALLOSTEM STRIP 20MMX25MM (Tissue) ×2 IMPLANT
APL SKNCLS STERI-STRIP NONHPOA (GAUZE/BANDAGES/DRESSINGS) ×1
BAG DECANTER FOR FLEXI CONT (MISCELLANEOUS) ×3 IMPLANT
BENZOIN TINCTURE PRP APPL 2/3 (GAUZE/BANDAGES/DRESSINGS) ×3 IMPLANT
BLADE SURG 11 STRL SS (BLADE) ×3 IMPLANT
BLADE SURG ROTATE 9660 (MISCELLANEOUS) IMPLANT
BONE ALLOSTEM MORSELIZED 5CC (Bone Implant) ×2 IMPLANT
BRUSH SCRUB EZ PLAIN DRY (MISCELLANEOUS) ×3 IMPLANT
BUR MATCHSTICK NEURO 3.0 LAGG (BURR) ×3 IMPLANT
BUR PRECISION FLUTE 6.0 (BURR) ×3 IMPLANT
CAGE RISE 11-17-15 10X26 (Cage) ×8 IMPLANT
CANISTER SUCT 3000ML (MISCELLANEOUS) ×3 IMPLANT
CLOSURE WOUND 1/2 X4 (GAUZE/BANDAGES/DRESSINGS) ×1
CONT SPEC 4OZ CLIKSEAL STRL BL (MISCELLANEOUS) ×6 IMPLANT
CORDS BIPOLAR (ELECTRODE) ×2 IMPLANT
COVER BACK TABLE 24X17X13 BIG (DRAPES) IMPLANT
COVER TABLE BACK 60X90 (DRAPES) ×3 IMPLANT
CROSSLINK 41MM-10 (Trauma) ×3 IMPLANT
DECANTER SPIKE VIAL GLASS SM (MISCELLANEOUS) ×3 IMPLANT
DERMABOND ADHESIVE PROPEN (GAUZE/BANDAGES/DRESSINGS) ×2
DERMABOND ADVANCED (GAUZE/BANDAGES/DRESSINGS) ×2
DERMABOND ADVANCED .7 DNX12 (GAUZE/BANDAGES/DRESSINGS) ×1 IMPLANT
DERMABOND ADVANCED .7 DNX6 (GAUZE/BANDAGES/DRESSINGS) IMPLANT
DRAPE C-ARM 42X72 X-RAY (DRAPES) ×6 IMPLANT
DRAPE LAPAROTOMY 100X72X124 (DRAPES) ×3 IMPLANT
DRAPE POUCH INSTRU U-SHP 10X18 (DRAPES) ×3 IMPLANT
DRAPE PROXIMA HALF (DRAPES) IMPLANT
DRAPE SURG 17X23 STRL (DRAPES) ×3 IMPLANT
DRSG OPSITE 4X5.5 SM (GAUZE/BANDAGES/DRESSINGS) ×6 IMPLANT
DRSG OPSITE POSTOP 4X8 (GAUZE/BANDAGES/DRESSINGS) ×2 IMPLANT
DURAPREP 26ML APPLICATOR (WOUND CARE) ×3 IMPLANT
ELECT BLADE 4.0 EZ CLEAN MEGAD (MISCELLANEOUS) ×3
ELECT REM PT RETURN 9FT ADLT (ELECTROSURGICAL) ×3
ELECTRODE BLDE 4.0 EZ CLN MEGD (MISCELLANEOUS) IMPLANT
ELECTRODE REM PT RTRN 9FT ADLT (ELECTROSURGICAL) ×1 IMPLANT
EVACUATOR 3/16  PVC DRAIN (DRAIN) ×2
EVACUATOR 3/16 PVC DRAIN (DRAIN) ×1 IMPLANT
GAUZE SPONGE 4X4 12PLY STRL (GAUZE/BANDAGES/DRESSINGS) ×3 IMPLANT
GAUZE SPONGE 4X4 16PLY XRAY LF (GAUZE/BANDAGES/DRESSINGS) ×3 IMPLANT
GLOVE BIO SURGEON STRL SZ8 (GLOVE) ×6 IMPLANT
GLOVE BIO SURGEON STRL SZ8.5 (GLOVE) ×2 IMPLANT
GLOVE EXAM NITRILE LRG STRL (GLOVE) IMPLANT
GLOVE EXAM NITRILE MD LF STRL (GLOVE) IMPLANT
GLOVE EXAM NITRILE XL STR (GLOVE) IMPLANT
GLOVE EXAM NITRILE XS STR PU (GLOVE) IMPLANT
GLOVE INDICATOR 8.5 STRL (GLOVE) ×6 IMPLANT
GLOVE SS BIOGEL STRL SZ 8 (GLOVE) IMPLANT
GLOVE SUPERSENSE BIOGEL SZ 8 (GLOVE) ×2
GOWN STRL REUS W/ TWL LRG LVL3 (GOWN DISPOSABLE) IMPLANT
GOWN STRL REUS W/ TWL XL LVL3 (GOWN DISPOSABLE) ×2 IMPLANT
GOWN STRL REUS W/TWL 2XL LVL3 (GOWN DISPOSABLE) IMPLANT
GOWN STRL REUS W/TWL LRG LVL3 (GOWN DISPOSABLE)
GOWN STRL REUS W/TWL XL LVL3 (GOWN DISPOSABLE) ×12
KIT BASIN OR (CUSTOM PROCEDURE TRAY) ×3 IMPLANT
KIT ROOM TURNOVER OR (KITS) ×3 IMPLANT
MILL MEDIUM DISP (BLADE) ×2 IMPLANT
NDL HYPO 25X1 1.5 SAFETY (NEEDLE) ×1 IMPLANT
NEEDLE HYPO 25X1 1.5 SAFETY (NEEDLE) ×3 IMPLANT
NS IRRIG 1000ML POUR BTL (IV SOLUTION) ×3 IMPLANT
PACK LAMINECTOMY NEURO (CUSTOM PROCEDURE TRAY) ×3 IMPLANT
PAD ARMBOARD 7.5X6 YLW CONV (MISCELLANEOUS) ×9 IMPLANT
PATTIES SURGICAL 1X1 (DISPOSABLE) ×2 IMPLANT
PLATE ANTC HORIZ 4.75X41 NS (Trauma) IMPLANT
ROD SOLERA 100MM (Rod) ×4 IMPLANT
SCREW MAS 6.5X45 (Screw) ×8 IMPLANT
SCREW SET SOLERA (Screw) ×24 IMPLANT
SCREW SET SOLERA TI (Screw) IMPLANT
SPONGE LAP 4X18 X RAY DECT (DISPOSABLE) IMPLANT
SPONGE SURGIFOAM ABS GEL 100 (HEMOSTASIS) ×3 IMPLANT
STRIP CLOSURE SKIN 1/2X4 (GAUZE/BANDAGES/DRESSINGS) ×3 IMPLANT
SUT VIC AB 0 CT1 18XCR BRD8 (SUTURE) ×2 IMPLANT
SUT VIC AB 0 CT1 8-18 (SUTURE) ×6
SUT VIC AB 2-0 CT1 18 (SUTURE) ×6 IMPLANT
SUT VICRYL 4-0 PS2 18IN ABS (SUTURE) ×3 IMPLANT
SYR 20ML ECCENTRIC (SYRINGE) ×3 IMPLANT
TOWEL OR 17X24 6PK STRL BLUE (TOWEL DISPOSABLE) ×3 IMPLANT
TOWEL OR 17X26 10 PK STRL BLUE (TOWEL DISPOSABLE) ×3 IMPLANT
TRAY FOLEY CATH 14FRSI W/METER (CATHETERS) ×1 IMPLANT
WATER STERILE IRR 1000ML POUR (IV SOLUTION) ×3 IMPLANT

## 2014-08-08 NOTE — Progress Notes (Signed)
Patient is an admit from PACU, patient alert and drowsy, but easily arousable. Patient denies any pain at this time.  Will continue to monitor.
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
## 2014-08-08 NOTE — Anesthesia Postprocedure Evaluation (Signed)
  Anesthesia Post-op Note  Patient: [name]  Procedure(s) Performed: Procedure(s):   Lumbar two/three, three/four. Posterior lumbar interbody fusion. Exploration four/five and removal of hardware. (N/A)  Patient Location: PACU  Anesthesia Type:General  Level of Consciousness: sedated, patient cooperative and responds to stimulation and voice  Airway and Oxygen Therapy: Patient Spontanous Breathing and Patient connected to nasal cannula oxygen  Post-op Pain: none  Post-op Assessment: Post-op Vital signs reviewed, Patient's Cardiovascular Status Stable, Respiratory Function Stable, Patent Airway, No signs of Nausea or vomiting and Pain level controlled  Post-op Vital Signs: Reviewed and stable  Last Vitals:  Filed Vitals:   08/08/14 1430  BP: 143/63  Pulse: 55  Temp:   Resp: 12    Complications: No apparent anesthesia complications
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
## 2014-08-08 NOTE — Progress Notes (Signed)
Writer called to the patient's room by wife that patient is having seizure. On examination, patient is conscious, had food in his mouth and told writer he couldn't swallow nor spit food out. No loss of consciousness and patient is communicating all this while. Vital signs within normal limit. Will change patient's diet to clear liquid and get speech to eval. MD notified

## 2014-08-08 NOTE — Clinical Social Work Note (Signed)
CSW consulted for possible SNF placement at time of discharge. CSW to continue to follow for discharge diposition pending PT/OT evaluation. Thank you for the referral.  Lubertha Sayres, MSW, [facility] [place] Licensed Clinical Social Worker [phone] and [phone]

## 2014-08-08 NOTE — Op Note (Signed)
Preoperative diagnosis: Lumbar spinal stenosis and L2-3 L3-4 with severe foraminal stenosis of the L2, L3, and L4 nerve roots. Degenerative scoliosis L2-3 and L3-4  Postoperative diagnosis: Same  Procedure: #1 reexploration of fusion removal of hardware L4-5  #2 decompressive lumbar laminectomy L2-3, L3-4 in excess and requiring more work than would be needed with a standard interbody fusion with a radical foraminotomies of the L2, L3, L4 nerve roots.  #3 posterior lumbar interbody fusion using the globus rise expandable peek cages packed with local autograft mixed with Allostem morsels L2-3 and L3-4  #3 pedicle screw fixation L2-L5 using the Solera pedicle screw and rod cobalt chrome construct from [employer]  #4 posterior lateral arthrodesis L2-L4 using locally harvested autograft mixed with Allostem morsels and strips  #5 open reduction spinal deformity  6 placement of large Hemovac drain  Surgeon: Dominica Severin Tyhir Schwan  Assistant: Newman Pies  Anesthesia: Gen.  EBL: Minimal  History of present illness: Patient is a 25 years and who is a long-standing back and worsening right hip and leg pain rate and L2 and L3 nerve root pattern as well as occasional L4. Workup revealed severe Gen. collapse and degenerative scoliosis at L2-3 L3-4 to the stricture treatment imaging findings and progression of clinical syndrome I recommended decompression stabilization procedure at L2-3 and L3 for possibly extending up to L1 with a reexploration of fusion will hardware L4-5. I extensively reviewed the risks and benefits of the operation the patient as well as perioperative course and expectations of outcome alternatives of surgery and hinged and agree to proceed forward.  Operative procedure: Patient brought into the or was induced under general anesthesia positioned Olson frame her back was prepped and draped in routine sterile fashion his old incision was opened up and extended cephalad after infiltration of  10 cc lidocaine with epi Beaulieu cautery stick and subcutaneous tissues subperiosteal dissections care lamina of L1 L2-L3 and then the hardware is exposed L4-5. TPS were identified interoperative x-ray confirmed the case appropriate levels of the spinous processes at L2-L3 removed central decompression was begun there was marked stenosis central stenosis from hourglass compression of the thecal sac by a marked facet arthropathy. Complete medial facetectomies were performed at L2-3 and L3-4 radically skeletonizing the L2, L3, and L4 nerve roots. Was marked stenosis on the L2 and L3 nerve roots from the facet arthropathy. This was teased off of the undersurface the medial aspect facet complex and complete foraminal decompressions were achieved. Epidural veins are coagulated lateral margins of disc space were achieved by aggressively under by the superior tickling facet H. level. Then the disc spaces were incised bilaterally distractors were inserted and using sequential distraction appropriate sizing for the implant was achieved disc spaces and cleanout radically due to rongeurs Epstein curettes the skull curettes and pelvis shavers. 10 x 17 mm rise peek implants 10 mm wide and 26 mm in length were then sized and packed with local autograft mixed with Allis stem morsels. These were expanded sequentially after bilaterally placing a sequentially expanded significantly reduce the deformity L2-3 and L3-4. After all the deformity been reduced at this taken to pedicle screw placement pilot holes were drilled and the fluoroscopy which was used the step along the way during the implant in the interbody space. The pedicles were then cannulated probe and tapped with a 55 Probed again a 6 5 x 45 screws were placed at L2 and L3. After all screws in place postop fluoroscopy confirmed good position of all the implants the  wound scope was irrigated meticulous hemostasis was maintained aggressive decortication was care MTPs or  lateral gutters the remainder the local autograft mixed with a morsels was placed posterior laterally. Then the rods were sized up and fashioned top tightness tightened down and torqued down crossing was applied all the foraminal reinspected confirm patency no migration of graft material prior to implantation of the second interbody space and aggressive amount of autograft mixed with the morsels was packed centrally and laterally to each cage. Then the or was placed and was currently with that with interrupted Vicryl the skin was closed running 4 subcuticular benzo and Steri-Strips are applied Dermabond the wounds dressed patient recovered in stable condition. At the end of case on it counts sponge counts were correct.

## 2014-08-08 NOTE — Transfer of Care (Signed)
Immediate Anesthesia Transfer of Care Note  Patient: [name]  Procedure(s) Performed: Procedure(s):   Lumbar two/three, three/four. Posterior lumbar interbody fusion. Exploration four/five and removal of hardware. (N/A)  Patient Location: PACU  Anesthesia Type:General  Level of Consciousness: awake, alert  and pateint uncooperative  Airway & Oxygen Therapy: Patient Spontanous Breathing and Patient connected to nasal cannula oxygen  Post-op Assessment: Report given to PACU RN, Post -op Vital signs reviewed and stable and Patient moving all extremities  Post vital signs: Reviewed and stable  Complications: No apparent anesthesia complications

## 2014-08-09 NOTE — Clinical Social Work Note (Signed)
Per chart review, pt to be discharged home with no PT follow-up. CSW signing off. Thank you for the referral.  Lubertha Sayres, MSW, [facility] Licensed Clinical Social Worker [phone] and [phone]

## 2014-08-09 NOTE — Evaluation (Signed)
Physical Therapy Evaluation Patient Details Name: [name] MRN: [id number] DOB: June 11, 1941 Today's Date: 08/09/2014   History of Present Illness  73 y.o. male admitted to [facility] on 08/08/14 for elective L2-5 decomression and fusion (and revision of previous fusion).  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  Clinical Impression  Pt is POD #1 s/p lumbar fusion.  He is moving well min hand held assist.  A RW would make him more independent if his surgeon agrees.  He is familiar with back precautions and is able to donn his brace by himself.  He will likely only need PT acutely and not at home.  The physician will determine if he needs f/u OP therapy at his post-hospital follow up appointments.   PT to follow acutely for deficits listed below.       Follow Up Recommendations No PT follow up;Supervision for mobility/OOB    Equipment Recommendations  Rolling walker with 5" wheels (pt reports he no longer has it from surgery 3 year ago)    Recommendations for Other Services   NA     Precautions / Restrictions Precautions Precautions: Back Precaution Comments: verbally reviewed back precautions with pt Required Braces or Orthoses: Spinal Brace Spinal Brace: Lumbar corset;Applied in sitting position      Mobility  Bed Mobility Overal bed mobility: Modified Independent             General bed mobility comments: HOB flat, but using the railing to log roll OOB.   Transfers Overall transfer level: Needs assistance Equipment used: 1 person hand held assist Transfers: Sit to/from Stand Sit to Stand: Min guard         General transfer comment: min guard assist to steady pt for balance during transitions  Ambulation/Gait Ambulation/Gait assistance: Min assist Ambulation Distance (Feet): 100 Feet Assistive device: 1 person hand held assist Gait Pattern/deviations: Step-through pattern;Wide base  of support Gait velocity: decreased   General Gait Details: Pt with wide BOS gait, needing assist for balance during gait.  No signs of legs buckling, but pt reporting some mild inner thigh discomfort while ambulating. RW brought to room to increase independence with gait.         Balance Overall balance assessment: Needs assistance Sitting-balance support: Feet supported;No upper extremity supported Sitting balance-Leahy Scale: Good     Standing balance support: No upper extremity supported;Bilateral upper extremity supported;Single extremity supported Standing balance-Leahy Scale: Fair                               Pertinent Vitals/Pain Pain Assessment: No/denies pain    Home Living Family/patient expects to be discharged to:: Private residence Living Arrangements: Spouse/significant other Available Help at Discharge: Family;Available 24 hours/day Type of Home: House Home Access: Stairs to enter Entrance Stairs-Rails: Right;Left;Can reach both Entrance Stairs-Number of Steps: 1 Home Layout: One level (front has only one rail, back has both rails and 2 steps) Home Equipment: Hand held shower head;Grab bars - toilet;Grab bars - tub/shower;Adaptive equipment      Prior Function Level of Independence: Independent         Comments: likes to play golf     Hand Dominance   Dominant Hand: Right    Extremity/Trunk Assessment   Upper Extremity Assessment: Defer to OT evaluation           Lower Extremity Assessment: Generalized  weakness      Cervical / Trunk Assessment: Other exceptions  Communication   Communication: No difficulties  Cognition Arousal/Alertness: Awake/alert Behavior During Therapy: WFL for tasks assessed/performed Overall Cognitive Status: Within Functional Limits for tasks assessed                      General Comments General comments (skin integrity, edema, etc.): Pt able to donn brace independently seated EOB.            Assessment/Plan    PT Assessment Patient needs continued PT services  PT Diagnosis Difficulty walking;Abnormality of gait;Generalized weakness;Acute pain   PT Problem List Decreased activity tolerance;Decreased balance;Decreased mobility;Decreased knowledge of use of DME;Decreased knowledge of precautions;Pain  PT Treatment Interventions DME instruction;Gait training;Stair training;Functional mobility training;Therapeutic activities;Therapeutic exercise;Balance training;Neuromuscular re-education;Patient/family education;Modalities   PT Goals (Current goals can be found in the Care Plan section) Acute Rehab PT Goals Patient Stated Goal: to go home, stop coming back for back surgery PT Goal Formulation: With patient Time For Goal Achievement: 08/16/14 Potential to Achieve Goals: Good    Frequency Min 5X/week    End of Session Equipment Utilized During Treatment: Gait belt;Back brace Activity Tolerance: Patient tolerated treatment well Patient left: in chair;with call bell/phone within reach;with chair alarm set           Time: 2633-3545 PT Time Calculation (min): 20 min   Charges:   PT Evaluation $Initial PT Evaluation Tier I: 1 Procedure PT Treatments $Gait Training: 8-22 mins        Sylvester Salonga B. [place], [place], DPT [phone]   08/09/2014, 1:00 PM

## 2014-08-09 NOTE — Progress Notes (Signed)
Subjective: Patient reports doing well no leg pain all pain center on his back.  Objective: Vital signs in last 24 hours: Temp:  [97 F (36.1 C)-98.9 F (37.2 C)] 97.3 F (36.3 C) (08/18 0200) Pulse Rate:  [49-82] 73 (08/18 0200) Resp:  [10-25] 18 (08/18 0200) BP: (94-181)/(33-159) 119/55 mmHg (08/18 0200) SpO2:  [92 %-100 %] 98 % (08/18 0200)  Intake/Output from previous day: 08/17 0701 - 08/18 0700 In: 3150 [I.V.:2900; Blood:250] Out: 1460 [Urine:710; Drains:100; Blood:650] Intake/Output this shift:    strength out of 5 wound clean and dry  Lab Results: No results found for this basename: WBC, HGB, HCT, PLT,  in the last 72 hours BMET No results found for this basename: NA, K, CL, CO2, GLUCOSE, BUN, CREATININE, CALCIUM,  in the last 72 hours  Studies/Results: Dg Lumbar Spine 2-3 Views  08/08/2014   CLINICAL DATA:  73 year old male undergoing revision of lumbar fusion. Initial encounter.  EXAM: DG C-ARM 61-120 MIN; LUMBAR SPINE - 2-3 VIEW  TECHNIQUE: Intraoperative fluoroscopic frontal and lateral views of the lumbar spine.  CONTRAST:  None.  FLUOROSCOPY TIME:  0 min 50 seconds.  COMPARISON:  CT Abdomen and Pelvis 07/16/2014, and earlier.  FINDINGS: Same numbering system as on the 08/11/2013 MRI. Preexisting L4-L5 posterior and interbody fusion. These images demonstrate addition of bilateral transpedicular screws and interbody implant at L2-L3 and L3-L4 plus probable decompression of these levels. Posterior connecting rods not yet in place.  IMPRESSION: Extension of lumbar fusion cephalad to the L2-L3 level.   Electronically Signed   By: Lars Pinks M.D.   On: 08/08/2014 11:38   Dg C-arm 61-120 Min  08/08/2014   CLINICAL DATA:  73 year old male undergoing revision of lumbar fusion. Initial encounter.  EXAM: DG C-ARM 61-120 MIN; LUMBAR SPINE - 2-3 VIEW  TECHNIQUE: Intraoperative fluoroscopic frontal and lateral views of the lumbar spine.  CONTRAST:  None.  FLUOROSCOPY TIME:  0 min 50  seconds.  COMPARISON:  CT Abdomen and Pelvis 07/16/2014, and earlier.  FINDINGS: Same numbering system as on the 08/11/2013 MRI. Preexisting L4-L5 posterior and interbody fusion. These images demonstrate addition of bilateral transpedicular screws and interbody implant at L2-L3 and L3-L4 plus probable decompression of these levels. Posterior connecting rods not yet in place.  IMPRESSION: Extension of lumbar fusion cephalad to the L2-L3 level.   Electronically Signed   By: Lars Pinks M.D.   On: 08/08/2014 11:38    Assessment/Plan: Progressive mobilization today with physical and occupational therapy   LOS: 1 day     Marlyn Tondreau P 08/09/2014, 6:41 AM

## 2014-08-09 NOTE — Progress Notes (Signed)
UR complete.  Colonel Krauser RN, MSN 

## 2014-08-09 NOTE — Progress Notes (Signed)
Nutrition Brief Note  Patient identified on the Malnutrition Screening Tool (MST) Report  Wt Readings from Last 15 Encounters:  08/01/14 188 lb 11.2 oz (85.594 kg)  07/21/14 187 lb (84.823 kg)  07/06/14 193 lb (87.544 kg)  05/30/14 193 lb (87.544 kg)  04/18/14 193 lb (87.544 kg)  11/09/13 199 lb 12.8 oz (90.629 kg)  10/18/13 193 lb (87.544 kg)  07/19/13 198 lb 3.2 oz (89.903 kg)  06/23/13 201 lb 3.2 oz (91.264 kg)  05/10/13 199 lb (90.266 kg)  02/24/13 199 lb (90.266 kg)  02/09/13 203 lb (92.08 kg)  12/07/12 201 lb (91.173 kg)  12/02/12 200 lb (90.719 kg)  11/25/12 195 lb (88.451 kg)   Based on most recent weight, pt has a BMI of 29.8 kg/(m^2).  Patient meets criteria for Overweight based on current BMI. Pt reports that he lost from 201 lb to 178 lbs due to poor appetite after surgery. Weight history shows 3% weight loss in the past month. He states that for the past week his appetite has been good and he is eating very well. Pt denies any questions or concerns at this time.   Current diet order is Regular, patient is consuming approximately 100% of meals at this time. Labs and medications reviewed.   No nutrition interventions warranted at this time. If nutrition issues arise, please consult RD.   Pryor Ochoa RD, LDN Inpatient Clinical Dietitian Pager: [phone] After Hours Pager: [phone]

## 2014-08-09 NOTE — Evaluation (Signed)
Occupational Therapy Evaluation Patient Details Name: [name] MRN: [id number] DOB: [birth year] Today's Date: 08/09/2014    History of Present Illness 73 y.o. male admitted to [facility] on 08/08/14 for elective L2-5 decomression and fusion (and revision of previous fusion).  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   Clinical Impression   Patient evaluated by Occupational Therapy with no further acute OT needs identified. All education has been completed and the patient has no further questions. See below for any follow-up Occupational Therapy or equipment needs. OT to sign off. Thank you for referral.      Follow Up Recommendations  No OT follow up    Equipment Recommendations  None recommended by OT    Recommendations for Other Services       Precautions / Restrictions Precautions Precautions: Back Precaution Comments: verbally reviewed back precautions with pt Required Braces or Orthoses: Spinal Brace Spinal Brace: Lumbar corset;Applied in sitting position      Mobility Bed Mobility Overal bed mobility: Modified Independent             General bed mobility comments: HOB flat, but using the railing to log roll OOB.   Transfers Overall transfer level: Modified independent Equipment used: Rolling walker (2 wheeled) Transfers: Sit to/from Stand Sit to Stand: Modified independent (Device/Increase time)         General transfer comment: min guard assist to steady pt for balance during transitions    Balance Overall balance assessment: Needs assistance Sitting-balance support: Feet supported;No upper extremity supported Sitting balance-Leahy Scale: Good     Standing balance support: No upper extremity supported;Bilateral upper extremity supported;Single extremity supported Standing balance-Leahy Scale: Fair                              ADL Overall ADL's : At  baseline                                       General ADL Comments: pt demonstrates shower transfer, don doff brace independent. Pt completed toilet transfer and sink level     Vision                     Perception     Praxis      Pertinent Vitals/Pain Pain Assessment: No/denies pain     Hand Dominance Right   Extremity/Trunk Assessment Upper Extremity Assessment Upper Extremity Assessment: Overall WFL for tasks assessed   Lower Extremity Assessment Lower Extremity Assessment: Defer to PT evaluation   Cervical / Trunk Assessment Cervical / Trunk Assessment: Other exceptions Cervical / Trunk Exceptions: h/o multiple lumbar and neck surgeries   Communication Communication Communication: No difficulties   Cognition Arousal/Alertness: Awake/alert Behavior During Therapy: WFL for tasks assessed/performed Overall Cognitive Status: Within Functional Limits for tasks assessed                     General Comments       Exercises       Shoulder Instructions      Home Living Family/patient expects to be discharged to:: Private residence Living Arrangements: Spouse/significant other Available Help at Discharge: Family;Available 24 hours/day Type of Home: House Home Access: Stairs to enter [employer] of Steps: 1 Entrance Stairs-Rails: Right;Left;Can reach both Home  Layout: One level     Bathroom Shower/Tub: Walk-in shower;Door   [employer] Toilet: Standard     Home Equipment: Hand held shower head;Grab bars - toilet;Adaptive equipment Adaptive Equipment: Reacher        Prior Functioning/Environment Level of Independence: Independent        Comments: likes to play golf    OT Diagnosis:     OT Problem List:     OT Treatment/Interventions:      OT Goals(Current goals can be found in the care plan section) Acute Rehab OT Goals Patient Stated Goal: to go home, stop coming back for back surgery  OT Frequency:      Barriers to D/C:            Co-evaluation              End of Session Equipment Utilized During Treatment: Back brace;Gait belt  Activity Tolerance: Patient tolerated treatment well Patient left: in bed;with call bell/phone within reach   Time: 4765-4650 OT Time Calculation (min): 35 min Charges:  OT General Charges $OT Visit: 1 Procedure OT Evaluation $Initial OT Evaluation Tier I: 1 Procedure OT Treatments $Self Care/Home Management : 23-37 mins G-Codes:    Parke Poisson B 08-25-14, 3:00 PM Pager: [phone]

## 2014-08-10 MED ORDER — SODIUM CHLORIDE 0.9 % IV SOLN
Freq: Once | INTRAVENOUS | Status: AC
  Administered 2014-08-10: 22:00:00 via INTRAVENOUS

## 2014-08-10 MED ORDER — TAMSULOSIN HCL 0.4 MG PO CAPS
0.4000 mg | ORAL_CAPSULE | Freq: Every day | ORAL | Status: DC
  Administered 2014-08-10 – 2014-08-14 (×5): 0.4 mg via ORAL
  Filled 2014-08-10 (×6): qty 1

## 2014-08-10 NOTE — Progress Notes (Signed)
PT Cancellation Note  Patient Details Name: [name] MRN: [id number] DOB: July 24, 1941   Cancelled Treatment:    Reason Eval/Treat Not Completed: Patient declined, stating he did not eat breakfast (trouble swallowing?-per pt report he has this issue "sometimes") and did not sleep at all last night.  He can hardly keep his eyes open to speak to me.  I will check back this afternoon.  Thanks,    Barbarann Ehlers. [place], [place], DPT [phone]   08/10/2014, 10:32 AM

## 2014-08-10 NOTE — Progress Notes (Signed)
Subjective: Patient reports Doing well except for back pain no leg pain having a little bit difficulty with urination  Objective: Vital signs in last 24 hours: Temp:  [97.8 F (36.6 C)-98.7 F (37.1 C)] 98 F (36.7 C) (08/19 0955) Pulse Rate:  [69-81] 79 (08/19 0955) Resp:  [18] 18 (08/19 0955) BP: (94-155)/(45-73) 105/57 mmHg (08/19 0955) SpO2:  [93 %-99 %] 93 % (08/19 0955)  Intake/Output from previous day: 08/18 0701 - 08/19 0700 In: 720 [P.O.:720] Out: 410 [Drains:410] Intake/Output this shift: Total I/O In: 120 [P.O.:120] Out: -   Wound clean dry and intact strength out of 5  Lab Results: No results found for this basename: WBC, HGB, HCT, PLT,  in the last 72 hours BMET No results found for this basename: NA, K, CL, CO2, GLUCOSE, BUN, CREATININE, CALCIUM,  in the last 72 hours  Studies/Results: No results found.  Assessment/Plan: Continue with physical outpatient therapy work on pain management check post void residual and initiate Flomax  LOS: 2 days     [name] P 08/10/2014, 1:18 PM

## 2014-08-10 NOTE — Progress Notes (Signed)
Physical Therapy Treatment Patient Details Name: [name] MRN: [id number] DOB: 10-19-1941 Today's Date: 08/10/2014    History of Present Illness 73 y.o. male admitted to [facility] on 08/08/14 for elective L2-5 decomression and fusion (and revision of previous fusion).  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    PT Comments    Pt is POD #2 and is not doing as well today as he did yesterday.  He is more fatigued, lightheaded and weaker.  He was unable to tolerate sitting up for more than ~5 mins.  BPs are soft 100s/50s, but stable.  HR 70s and O2 sats mid to upper 90s on RA after gait.  Pt did not sleep well last night and some of his issues may be due to exhaustion.  PT will continue to follow acutely.   Follow Up Recommendations  No PT follow up;Supervision for mobility/OOB     Equipment Recommendations  Rolling walker with 5" wheels    Recommendations for Other Services   NA     Precautions / Restrictions Precautions Precautions: Back Required Braces or Orthoses: Spinal Brace Spinal Brace: Lumbar corset;Applied in sitting position    Mobility  Bed Mobility Overal bed mobility: Modified Independent;Needs Assistance Bed Mobility: Rolling;Sidelying to Sit;Sit to Sidelying Rolling: Modified independent (Device/Increase time) Sidelying to sit: Modified independent (Device/Increase time)     Sit to sidelying: Min guard General bed mobility comments: Verbal reminders not to twist, cues for correct log roll technique, pt using rails heavily.  Will need to take railing away next session.  Very minimal help to get feet back into bed when going sit to sidelying.   Transfers Overall transfer level: Modified independent Equipment used: Rolling walker (2 wheeled) Transfers: Sit to/from Stand Sit to Stand: Min guard         General transfer comment: Min guard assist for safety during transition as  pt reports lightheadedness with both sitting and standing.   Ambulation/Gait Ambulation/Gait assistance: Min assist Ambulation Distance (Feet): 200 Feet Assistive device: Rolling walker (2 wheeled) Gait Pattern/deviations: Step-through pattern;Decreased dorsiflexion - right;Decreased weight shift to right     General Gait Details: Pt reports and has some mild weakness in his right leg today.  Very suttle foot drag and subjective reports of knee instability (although, no outward signs).        Balance Overall balance assessment: Needs assistance Sitting-balance support: Feet supported Sitting balance-Leahy Scale: Good     Standing balance support: Bilateral upper extremity supported;No upper extremity supported Standing balance-Leahy Scale: Fair                      Cognition Arousal/Alertness: Awake/alert Behavior During Therapy: WFL for tasks assessed/performed Overall Cognitive Status: Within Functional Limits for tasks assessed                         General Comments General comments (skin integrity, edema, etc.): Pt reporting lightheadedness throughout gait, but able to continue.  BP taken at end of session and are soft 100s/50s.  Pt repositioned back in bed as he felt too ill seated in the chair.       Pertinent Vitals/Pain Pain Assessment: 0-10 Pain Score: 5  Pain Location: low back Pain Descriptors / Indicators: Aching Pain Intervention(s): Limited activity within patient's tolerance;Monitored during session;Repositioned           PT Goals (current  goals can now be found in the care plan section) Acute Rehab PT Goals Patient Stated Goal: to go home, stop coming back for back surgery Progress towards PT goals: Progressing toward goals    Frequency  Min 5X/week    PT Plan Current plan remains appropriate       End of Session Equipment Utilized During Treatment: Back brace Activity Tolerance: Patient limited by fatigue;Patient limited  by pain Patient left: in bed;with call bell/phone within reach;with bed alarm set     Time: 1456-1520 PT Time Calculation (min): 24 min  Charges:  $Gait Training: 8-22 mins $Therapeutic Activity: 8-22 mins                      Abigial Newville B. [place], [place], DPT [phone]   08/10/2014, 3:34 PM

## 2014-08-10 NOTE — Progress Notes (Signed)
Pt noted to be hypotensive, with dizziness upon moving in bed. On call notified, with new fluid orders. Will continue to monitor. Verdie Drown RN BSN

## 2014-08-11 MED ORDER — BUTALBITAL-APAP-CAFFEINE 50-325-40 MG PO TABS: 2.0000 | ORAL_TABLET | ORAL | Status: DC | PRN

## 2014-08-11 NOTE — Progress Notes (Signed)
Physical Therapy Treatment Patient Details Name: [name] MRN: [id number] DOB: [birth year] Today's Date: 08/11/2014    History of Present Illness 73 y.o. male admitted to [facility] on 08/08/14 for elective L2-5 decomression and fusion (and revision of previous fusion).  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    PT Comments    Pt feels much better today and is mobilizing much better as well.  He was able to demonstrate safety with stairs to simulate home entry and needs continued reinforcement of his back precautions and how they relate to his actual mobility.  PT will continue to follow acutely.  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   Follow Up Recommendations  No PT follow up;Supervision for mobility/OOB     Equipment Recommendations  Rolling walker with 5" wheels    Recommendations for Other Services   NA     Precautions / Restrictions Precautions Precautions: Back Precaution Comments: Pt was able to report 1/3 back precautions and needed significant (max) cues during functional mobility for real life application of back precautions.   Required Braces or Orthoses: Spinal Brace Spinal Brace: Lumbar corset;Applied in sitting position Restrictions Weight Bearing Restrictions: No    Mobility  Bed Mobility Overal bed mobility: Modified Independent;Needs Assistance Bed Mobility: Rolling;Sidelying to Sit;Sit to Sidelying Rolling: Modified independent (Device/Increase time) Sidelying to sit: Modified independent (Device/Increase time)     Sit to sidelying: Modified independent (Device/Increase time) General bed mobility comments: Pt needs verbal cues for correct log roll technique for both getting out of and into bed.   Transfers Overall transfer level:  Modified independent Equipment used: Rolling walker (2 wheeled) Transfers: Sit to/from Stand Sit to Stand: Modified independent (Device/Increase time)         General transfer comment: Preformed sit to stand twice to reinforce safe hand placement as pt wants to pull with both hands on the RW to go to standing.   Ambulation/Gait Ambulation/Gait assistance: Min guard Ambulation Distance (Feet): 120 Feet Assistive device: Rolling walker (2 wheeled) Gait Pattern/deviations: Step-through pattern     General Gait Details: Better right foot clearance today and less signs of knee weakness on the right.  Pt still subjectively reporting right inner thigh pain with gait. Verbal cues for upright posture and light hands with gait with RW.    Stairs Stairs: Yes Stairs assistance: Supervision Stair Management: One rail Right;Step to pattern;Sideways Number of Stairs: 4 General stair comments: Verbal cues for safest technique and squre up to the railing instead of twisting sideways when using both hands on the railing to go up/down.          Balance Overall balance assessment: Needs assistance Sitting-balance support: Feet supported;No upper extremity supported Sitting balance-Leahy Scale: Good     Standing balance support: Bilateral upper extremity supported;No upper extremity supported Standing balance-Leahy Scale: Good                      Cognition Arousal/Alertness: Awake/alert Behavior During Therapy: WFL for tasks assessed/performed Overall Cognitive Status: Within Functional Limits for tasks assessed       Memory: Decreased short-term memory;Decreased recall of precautions (I beleive this is his baseline due to head injury in the [place])  Pertinent Vitals/Pain Pain Assessment: 0-10 Pain Score: 5  Pain Location: low back  Pain Descriptors / Indicators: Aching;Burning Pain Intervention(s): Limited activity within patient's  tolerance;Monitored during session;Repositioned           PT Goals (current goals can now be found in the care plan section) Acute Rehab PT Goals Patient Stated Goal: to go home, stop coming back for back surgery Progress towards PT goals: Progressing toward goals    Frequency  Min 5X/week    PT Plan Current plan remains appropriate       End of Session Equipment Utilized During Treatment: Back brace Activity Tolerance: Patient tolerated treatment well Patient left: in bed;with call bell/phone within reach;with bed alarm set     Time: 1004-1020 PT Time Calculation (min): 16 min  Charges:  $Gait Training: 8-22 mins                      Adeleigh Barletta B. [place], [place], DPT [phone]   08/11/2014, 10:51 AM

## 2014-08-11 NOTE — Progress Notes (Signed)
hemovac removed from lumbar site without complication. No bleeding, no hematoma. Denies numbness/tingling or pain. Gauze/hypafix in place. Call bell at side.

## 2014-08-11 NOTE — Care Management Note (Addendum)
Page 1 of 1   08/15/2014     7:51:59 PM CARE MANAGEMENT NOTE 08/15/2014  Patient:  KINSER, FELLMAN   Account Number:  [id number]  Date Initiated:  08/09/2014  Documentation initiated by:  Lorne Skeens  Subjective/Objective Assessment:   Patient was admitted for a PLIF.  Lives at home with spouse.     Action/Plan:   Will follow for discharge needs pending PT/OT evals and physician orders.   Anticipated DC Date:  08/12/2014   Anticipated DC Plan:  [place]  CM consult      Choice offered to / List presented to:             Status of service:  In process, will continue to follow Medicare Important Message given?  YES (If response is "NO", the following Medicare IM given date fields will be blank) Date Medicare IM given:  08/11/2014 Medicare IM given by:  Lorne Skeens Date Additional Medicare IM given:  08/15/2014 Additional Medicare IM given by:  Lorne Skeens  Discharge Disposition:    Per UR Regulation:  Reviewed for med. necessity/level of care/duration of stay  If discussed at [place] of Stay Meetings, dates discussed:    Comments:  08/15/14 [place], MSN, CM- Additional Medicare IM letter provided.   08/11/14 [place], MSN, CM- [facility] was notified that patient needs a rolling walker prior to discharge home today.   08/11/14 [place], MSN, CM- Met with patient to provide Medicare IM letter.  Patient is requesting a rolling walker at discharge, but has received one through Medicare within the last 5 years. Patient is aware that this will be private pay.  CM will contact [facility] on day of discharge. Patient is aware that payment will be due at time of delivery.

## 2014-08-11 NOTE — Progress Notes (Signed)
Subjective: Patient reports patient did well no leg pain some back stiffness and soreness voiding well but still feels he needs more therapy to strengthen his legs  Objective: Vital signs in last 24 hours: Temp:  [98 F (36.7 C)-100.4 F (38 C)] 99.5 F (37.5 C) (08/20 0523) Pulse Rate:  [69-79] 77 (08/20 0523) Resp:  [18-20] 20 (08/20 0523) BP: (78-105)/(40-71) 100/47 mmHg (08/20 0523) SpO2:  [93 %-98 %] 96 % (08/20 0523)  Intake/Output from previous day: 08/19 0701 - 08/20 0700 In: 265 [P.O.:240] Out: 250 [Urine:250] Intake/Output this shift:    strength out of 5 wound clean and dry  Lab Results: No results found for this basename: WBC, HGB, HCT, PLT,  in the last 72 hours BMET No results found for this basename: NA, K, CL, CO2, GLUCOSE, BUN, CREATININE, CALCIUM,  in the last 72 hours  Studies/Results: No results found.  Assessment/Plan: Continue physical and occupational therapy DC Hemovac   LOS: 3 days     [name] P 08/11/2014, 7:10 AM

## 2014-08-12 ENCOUNTER — Inpatient Hospital Stay (HOSPITAL_COMMUNITY): Payer: Medicare Other

## 2014-08-12 LAB — CBC WITH DIFFERENTIAL/PLATELET
BASOS ABS: 0 10*3/uL (ref 0.0–0.1)
Basophils Relative: 0 % (ref 0–1)
Eosinophils Absolute: 0 10*3/uL (ref 0.0–0.7)
Eosinophils Relative: 0 % (ref 0–5)
HCT: 29.3 % — ABNORMAL LOW (ref 39.0–52.0)
Hemoglobin: 9.9 g/dL — ABNORMAL LOW (ref 13.0–17.0)
LYMPHS ABS: 0.4 10*3/uL — AB (ref 0.7–4.0)
Lymphocytes Relative: 4 % — ABNORMAL LOW (ref 12–46)
MCH: 29.8 pg (ref 26.0–34.0)
MCHC: 33.8 g/dL (ref 30.0–36.0)
MCV: 88.3 fL (ref 78.0–100.0)
Monocytes Absolute: 0.2 10*3/uL (ref 0.1–1.0)
Monocytes Relative: 2 % — ABNORMAL LOW (ref 3–12)
NEUTROS PCT: 94 % — AB (ref 43–77)
Neutro Abs: 10.2 10*3/uL — ABNORMAL HIGH (ref 1.7–7.7)
PLATELETS: 250 10*3/uL (ref 150–400)
RBC: 3.32 MIL/uL — AB (ref 4.22–5.81)
RDW: 11.9 % (ref 11.5–15.5)
WBC: 10.8 10*3/uL — ABNORMAL HIGH (ref 4.0–10.5)

## 2014-08-12 LAB — GLUCOSE, CAPILLARY: Glucose-Capillary: 292 mg/dL — ABNORMAL HIGH (ref 70–99)

## 2014-08-12 LAB — BASIC METABOLIC PANEL
Anion gap: 14 (ref 5–15)
BUN: 12 mg/dL (ref 6–23)
CO2: 27 mEq/L (ref 19–32)
Calcium: 9 mg/dL (ref 8.4–10.5)
Chloride: 93 mEq/L — ABNORMAL LOW (ref 96–112)
Creatinine, Ser: 0.88 mg/dL (ref 0.50–1.35)
GFR, EST NON AFRICAN AMERICAN: 83 mL/min — AB (ref >90)
Glucose, Bld: 236 mg/dL — ABNORMAL HIGH (ref 70–99)
Potassium: 4 mEq/L (ref 3.7–5.3)
Sodium: 134 mEq/L — ABNORMAL LOW (ref 137–147)

## 2014-08-12 MED ORDER — BISACODYL 10 MG RE SUPP: 10.0000 mg | Freq: Every day | RECTAL | Status: DC | PRN

## 2014-08-12 MED ORDER — DEXAMETHASONE SODIUM PHOSPHATE 4 MG/ML IJ SOLN
8.0000 mg | Freq: Four times a day (QID) | INTRAMUSCULAR | Status: AC
  Administered 2014-08-12 – 2014-08-13 (×4): 8 mg via INTRAVENOUS
  Filled 2014-08-12 (×6): qty 2

## 2014-08-12 MED ORDER — DIPHENHYDRAMINE HCL 50 MG/ML IJ SOLN: 6.2500 mg | Freq: Once | INTRAMUSCULAR | Status: DC

## 2014-08-12 MED ORDER — GABAPENTIN 300 MG PO CAPS: 300.0000 mg | ORAL_CAPSULE | Freq: Two times a day (BID) | ORAL | Status: DC

## 2014-08-12 MED ORDER — POLYETHYLENE GLYCOL 3350 17 G PO PACK
17.0000 g | PACK | Freq: Every day | ORAL | Status: DC
  Administered 2014-08-12: 17 g via ORAL
  Filled 2014-08-12 (×4): qty 1

## 2014-08-12 MED ORDER — BISACODYL 10 MG RE SUPP
10.0000 mg | Freq: Every day | RECTAL | Status: DC | PRN
  Administered 2014-08-12: 10 mg via RECTAL
  Filled 2014-08-12: qty 1

## 2014-08-12 MED ORDER — SODIUM CHLORIDE 0.9 % IV BOLUS (SEPSIS)
1000.0000 mL | Freq: Once | INTRAVENOUS | Status: AC
  Administered 2014-08-12: 1000 mL via INTRAVENOUS

## 2014-08-12 MED ORDER — PANTOPRAZOLE SODIUM 40 MG IV SOLR
40.0000 mg | Freq: Two times a day (BID) | INTRAVENOUS | Status: DC
  Administered 2014-08-12 (×2): 40 mg via INTRAVENOUS
  Filled 2014-08-12 (×2): qty 40

## 2014-08-12 NOTE — Progress Notes (Signed)
Pt assisted to bathroom while ambulating became diaphoretic, pale, and shaky. Pt assisted back to bed.  O2 sat upper 80's encouraged to take deep breaths. O2 applied via n/c at 2L O2 sat normalized to upper 90's. Pt stated that he felt relief. MD notified instructed to NS bolus. Will monitor.

## 2014-08-12 NOTE — Progress Notes (Signed)
Upon ambulation to the bathroom, pt became pale, diaphoretic, and shaky.  Pt assisted back to bed, BP checked and was 86/44 and O2 sats were 90.  Pt placed on 2 L O2 via [place] and placed in Trendelenburg, vitals normalized to 99% Os and 138/58.  Pt reports relief. Will monitor.

## 2014-08-12 NOTE — Progress Notes (Signed)
Physical Therapy Treatment Patient Details Name: [name] MRN: [id number] DOB: [birth year] Today's Date: 08/12/2014    History of Present Illness 73 y.o. male admitted to [facility] on 08/08/14 for elective L2-5 decomression and fusion (and revision of previous fusion).  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    PT Comments    Pt is progressing well with his mobility despite increase in pain today.  He is also asymptomatic despite low BPs all morning.  He would continue to benefit from a RW at home for d/c.  PT to continue to follow acutely.   Follow Up Recommendations  No PT follow up;Supervision for mobility/OOB     Equipment Recommendations  Rolling walker with 5" wheels    Recommendations for Other Services   NA     Precautions / Restrictions Precautions Precautions: Back Required Braces or Orthoses: Spinal Brace Spinal Brace: Lumbar corset;Applied in sitting position    Mobility  Bed Mobility Overal bed mobility: Modified Independent Bed Mobility: Sidelying to Sit;Sit to Sidelying;Rolling Rolling: Modified independent (Device/Increase time) Sidelying to sit: Modified independent (Device/Increase time)     Sit to sidelying: Min guard General bed mobility comments: Only assist needed was to help pt just lift his legs up into bed when going from sitting to sidelying.  I reinforced log roll (especially getting back into bed) technique today  Transfers Overall transfer level: Modified independent Equipment used: Rolling walker (2 wheeled) Transfers: Sit to/from Stand Sit to Stand: Modified independent (Device/Increase time)         General transfer comment: Verbal cues to push up from the bed, not pull on the RW  Ambulation/Gait Ambulation/Gait assistance: Supervision Ambulation Distance (Feet): 250 Feet Assistive device: Rolling walker (2 wheeled) Gait Pattern/deviations:  Step-through pattern     General Gait Details: Supervision for safety as pt's BPs have been very low this AM.  Pt is asymptomatic the whole walk.           Balance Overall balance assessment: Needs assistance Sitting-balance support: Feet supported;No upper extremity supported Sitting balance-Leahy Scale: Good     Standing balance support: Bilateral upper extremity supported;No upper extremity supported Standing balance-Leahy Scale: Good                      Cognition Arousal/Alertness: Awake/alert Behavior During Therapy: WFL for tasks assessed/performed Overall Cognitive Status: History of cognitive impairments - at baseline       Memory: Decreased short-term memory (from PMHx of TBI)                     Pertinent Vitals/Pain Pain Assessment: 0-10 Pain Score: 7  Pain Location: low back Pain Descriptors / Indicators: Aching Pain Intervention(s): Limited activity within patient's tolerance;Monitored during session;Repositioned           PT Goals (current goals can now be found in the care plan section) Acute Rehab PT Goals Patient Stated Goal: to go home, stop coming back for back surgery Progress towards PT goals: Progressing toward goals    Frequency  Min 5X/week    PT Plan Current plan remains appropriate       End of Session Equipment Utilized During Treatment: Back brace Activity Tolerance: Patient tolerated treatment well Patient left: in bed;with call bell/phone within reach     Time: 8144-8185 PT Time Calculation (min): 17 min  Charges:  $Gait Training: 8-22 mins  Barbarann Ehlers [place], [place], DPT [phone]   08/12/2014, 4:03 PM

## 2014-08-12 NOTE — Progress Notes (Signed)
Subjective: Patient reports Little more pain today having a little bit of left anterior quad pain that begins as: Stop his quad is not radiating from his back. Denies any new numbness or tingling. Reports positive flatus but no bowel movement.  Objective: Vital signs in last 24 hours: Temp:  [97.8 F (36.6 C)-99.1 F (37.3 C)] 98.9 F (37.2 C) (08/21 0955) Pulse Rate:  [67-77] 67 (08/21 1002) Resp:  [18-20] 18 (08/21 0955) BP: (94-116)/(45-58) 111/58 mmHg (08/21 1004) SpO2:  [94 %-98 %] 97 % (08/21 1002)  Intake/Output from previous day:   Intake/Output this shift: Total I/O In: 240 [P.O.:240] Out: -   Strength out of 5 wound clean and dry sensation intact  Lab Results: No results found for this basename: WBC, HGB, HCT, PLT,  in the last 72 hours BMET No results found for this basename: NA, K, CL, CO2, GLUCOSE, BUN, CREATININE, CALCIUM,  in the last 72 hours  Studies/Results: No results found.  Assessment/Plan: Startle Decadron and Neurontin to work on bowels  LOS: 4 days     Bettye Sitton P 08/12/2014, 11:16 AM

## 2014-08-13 MED ORDER — PANTOPRAZOLE SODIUM 40 MG PO TBEC
40.0000 mg | DELAYED_RELEASE_TABLET | Freq: Two times a day (BID) | ORAL | Status: DC
  Administered 2014-08-13 – 2014-08-15 (×5): 40 mg via ORAL
  Filled 2014-08-13 (×5): qty 1

## 2014-08-13 NOTE — Progress Notes (Signed)
Subjective: Patient reports Doing much better this morning significant proven leg pain back pain is improved  Objective: Vital signs in last 24 hours: Temp:  [97.6 F (36.4 C)-99 F (37.2 C)] 99 F (37.2 C) (08/22 0544) Pulse Rate:  [59-79] 79 (08/22 0544) Resp:  [14-20] 20 (08/22 0544) BP: (69-120)/(41-70) 102/52 mmHg (08/22 0544) SpO2:  [94 %-100 %] 100 % (08/22 0544) Weight:  [88.86 kg (195 lb 14.4 oz)] 88.86 kg (195 lb 14.4 oz) (08/21 1128)  Intake/Output from previous day: 08/21 0701 - 08/22 0700 In: 483 [P.O.:480; I.V.:3] Out: -  Intake/Output this shift:    Awake alert oriented strength out of 5 wound clean dry and  Lab Results:  Recent Labs  08/12/14 1611  WBC 10.8*  HGB 9.9*  HCT 29.3*  PLT 250   BMET  Recent Labs  08/12/14 1611  NA 134*  K 4.0  CL 93*  CO2 27  GLUCOSE 236*  BUN 12  CREATININE 0.88  CALCIUM 9.0    Studies/Results: Dg Chest Port 1 View  08/12/2014   CLINICAL DATA:  Difficulty breathing  EXAM: PORTABLE CHEST - 1 VIEW  COMPARISON:  August 01, 2014  FINDINGS: There is mild atelectatic change in the left mid lung. Lungs elsewhere are clear. Heart size and pulmonary vascularity are normal. No pneumothorax. No adenopathy. There is postoperative change in the lower cervical spine.  IMPRESSION: Mild atelectatic change left mid lung.  No edema or consolidation.   Electronically Signed   By: Lowella Grip M.D.   On: 08/12/2014 13:57    Assessment/Plan: Continue physical occupational therapy today continue to work on pain management probable discharge tomorrow  LOS: 5 days     Proctor Carriker P 08/13/2014, 10:05 AM

## 2014-08-14 NOTE — Progress Notes (Signed)
Physical Therapy Treatment Patient Details Name: [name] MRN: [id number] DOB: 08/14/1941 Today's Date: 08/14/2014    History of Present Illness 73 y.o. male admitted to [facility] on 08/08/14 for elective L2-5 decomression and fusion (and revision of previous fusion).  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    PT Comments    Good progress with mobility, asymptomatic for orthostasis with a long walk; Needs reinforcement of back prec, especially with bed mobility  Follow Up Recommendations  No PT follow up;Supervision for mobility/OOB     Equipment Recommendations  Rolling walker with 5" wheels    Recommendations for Other Services       Precautions / Restrictions Precautions Precautions: Back Precaution Comments: continuing to work on back prec Required Braces or Orthoses: Spinal Brace Spinal Brace: Lumbar corset;Applied in sitting position Restrictions Weight Bearing Restrictions: No    Mobility  Bed Mobility Overal bed mobility: Needs Assistance Bed Mobility: Sidelying to Sit;Sit to Sidelying;Rolling Rolling: Supervision Sidelying to sit: Supervision     Sit to sidelying: Supervision General bed mobility comments: Supervision only to make sure pt observes back prec  Transfers Overall transfer level: Modified independent Equipment used: Rolling walker (2 wheeled) Transfers: Sit to/from Stand Sit to Stand: Modified independent (Device/Increase time)         General transfer comment: Verbal cues to push up from the bed, not pull on the RW  Ambulation/Gait Ambulation/Gait assistance: Supervision Ambulation Distance (Feet): 520 Feet Assistive device: Rolling walker (2 wheeled) Gait Pattern/deviations: Step-through pattern     General Gait Details: Supervision for safety as pt's BPs have been very low .  Pt is asymptomatic the whole walk.    Stairs   Stairs assistance:  Supervision Stair Management: One rail Right Number of Stairs: 6 General stair comments: Verbal cues for safest technique and squre up to the railing instead of twisting sideways when using both hands on the railing to go up/down. Problem-solved ascending and descending with one hand on rail, and other on RW  Wheelchair Mobility    Modified Rankin (Stroke Patients Only)       Balance     Sitting balance-Leahy Scale: Good       Standing balance-Leahy Scale: Good                      Cognition Arousal/Alertness: Awake/alert Behavior During Therapy: WFL for tasks assessed/performed Overall Cognitive Status: History of cognitive impairments - at baseline       Memory: Decreased short-term memory (from PMHx of TBI)              Exercises      General Comments        Pertinent Vitals/Pain Pain Assessment: 0-10 Pain Score: 4  Pain Location: low back Pain Descriptors / Indicators: Aching Pain Intervention(s): Monitored during session;Repositioned    Home Living                      Prior Function            PT Goals (current goals can now be found in the care plan section) Acute Rehab PT Goals Patient Stated Goal: to go home, stop coming back for back surgery PT Goal Formulation: With patient Time For Goal Achievement: 08/16/14 Potential to Achieve Goals: Good Progress towards PT goals: Progressing toward goals    Frequency  Min 5X/week  PT Plan Current plan remains appropriate    Co-evaluation             End of Session Equipment Utilized During Treatment: Back brace Activity Tolerance: Patient tolerated treatment well Patient left: in bed;with call bell/phone within reach     Time: 0938-0959 PT Time Calculation (min): 21 min  Charges:  $Gait Training: 8-22 mins                    G Codes:      Quin Hoop 08/14/2014, 10:10 AM  Roney Marion, PT  Acute Rehabilitation Services Pager [phone] Office  [phone]

## 2014-08-14 NOTE — Progress Notes (Signed)
Patient ID: [name], male   DOB: 29-Apr-1941, 73 y.o.   MRN: [id number] A little more pain today across his back in his right leg overall he doesn't very well we'll continue to mobilize work on his bowels continue with physical therapy and hopefully he'll be a better controlled and he can go home tomorrow.

## 2014-08-15 MED ORDER — HYDROMORPHONE HCL 2 MG PO TABS: 2.0000 mg | ORAL_TABLET | ORAL | Status: DC | PRN

## 2014-08-15 NOTE — Progress Notes (Signed)
Physical Therapy Treatment Patient Details Name: [name] MRN: [id number] DOB: [birth year] Today's Date: 08/15/2014    History of Present Illness 73 y.o. male admitted to [facility] on 08/08/14 for elective L2-5 decomression and fusion (and revision of previous fusion).  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    PT Comments    Pt tolerating mobility well however con't to be impulsive and non-compliant with adherence to sternal precautions despite maximal education and verbal cues.  Follow Up Recommendations  No PT follow up;Supervision for mobility/OOB     Equipment Recommendations  Rolling walker with 5" wheels    Recommendations for Other Services       Precautions / Restrictions Precautions Precautions: Back Precaution Booklet Issued: Yes (comment) Precaution Comments: pt reports "oh I know them, this is my 5th back surgery." pt impulsive with no adherence to back precautions Required Braces or Orthoses: Spinal Brace Spinal Brace: Lumbar corset;Applied in standing position Restrictions Weight Bearing Restrictions: No    Mobility  Bed Mobility Overal bed mobility: Modified Independent Bed Mobility: Supine to Sit     Supine to sit: Modified independent (Device/Increase time)     General bed mobility comments: pt impulsive and unafe as demo'd by carrying walker up the stairs. pt did not do log roll to/get in OOB. pt pulled self up on bed rail. When instructed to roll pt reports "oh I"m fine, i've been doing this for years, this is my 5th back surgery."  Pt educated on the importance of adherence to back precuations to protect the surgery however pt non-receptive  Transfers Overall transfer level: Modified independent Equipment used: Rolling walker (2 wheeled) Transfers: Sit to/from Stand Sit to Stand: Modified independent (Device/Increase time)         General transfer comment:  Verbal cues to push up from the bed, not pull on the RW  Ambulation/Gait Ambulation/Gait assistance: Supervision Ambulation Distance (Feet): 600 Feet Assistive device: Rolling walker (2 wheeled) Gait Pattern/deviations: Step-through pattern     General Gait Details: pt walked in room without RW. asked pt to walk in hallway without RW however pt reports "I'm fine with the walker"   Stairs Stairs: Yes Stairs assistance: Min guard Stair Management: One rail Right;Alternating pattern Number of Stairs: 10 General stair comments: pt carryed walker up the stairs despite maximal v/c's not to.pt reports "I"m fine, i know what I"m doing" Pt again educated on safety and benefits of adhering to precautions to preserve surgical site  Wheelchair Mobility    Modified Rankin (Stroke Patients Only)       Balance     Sitting balance-Leahy Scale: Good       Standing balance-Leahy Scale: Good                      Cognition Arousal/Alertness: Awake/alert Behavior During Therapy: Impulsive Overall Cognitive Status: Within Functional Limits for tasks assessed                      Exercises      General Comments        Pertinent Vitals/Pain Pain Assessment: 0-10 Pain Score: 3  Pain Location: low back Pain Descriptors / Indicators: Aching Pain Intervention(s): Monitored during session    Home Living                      Prior Function  PT Goals (current goals can now be found in the care plan section) Progress towards PT goals: Progressing toward goals    Frequency  Min 3X/week    PT Plan Frequency needs to be updated    Co-evaluation             End of Session Equipment Utilized During Treatment: Back brace Activity Tolerance: Patient tolerated treatment well Patient left: in chair;with call bell/phone within reach     Time: 0812-0828 PT Time Calculation (min): 16 min  Charges:  $Gait Training: 8-22 mins                     G Codes:      Kingsley Callander 08/15/2014, 8:53 AM  Kittie Plater, PT, DPT Pager #: [phone] Office #: [phone]

## 2014-08-15 NOTE — Progress Notes (Signed)
Notified Nudelman MD that patient's IV was taken out and that the patient does not want another back in because he says that he is going home and not taking IV medication. Will notify on coming nurse that Sherwood Gambler MD stated to refer to Saintclair Halsted MD this am. Will continue to monitor accordingly.

## 2014-08-15 NOTE — Discharge Instructions (Signed)
No lifting no bending no twisting no driving a riding a car less he is come back and forth to see me. Keep the incision clean and dry.

## 2014-08-15 NOTE — Progress Notes (Signed)
Discharge orders received. Walker delivered. Fusion education, medications, and follow up information reviewed with patient. Explained S/S of infections and patient verbalized understanding of when to call MD. Prescription for dilaudid PO given. Patient discharged home with wife via wheelchair. Avien Taha, [place] 08/15/2014 12:50 PM

## 2014-08-15 NOTE — Discharge Summary (Signed)
  Physician Discharge Summary  Patient ID: [name] MRN: [id number] DOB/AGE: February 14, 1941 73 y.o.  Admit date: 08/08/2014 Discharge date: 08/15/2014  Admission Diagnoses:lumbar spondylosis and degenerative scoliosis L2-3 L3-4  Discharge Diagnoses: same Active Problems:   Scoliosis of lumbar spine   Discharged Condition: good  Hospital Course: this is been hospital underwent an L2-3 L3 for decompression and fusion postoperatively are well recovered in the floor on the floor was convalescing well and living and voiding spontaneously tolerating regular diet he had some difficulty initially with pain control as well as a postop ileus but this resolved and by postoperative day 7 was stable for discharge.  Consults: Significant Diagnostic Studies: Treatments:decompression fusion L3-L4 Discharge Exam: Blood pressure 129/64, pulse 55, temperature 97.4 F (36.3 C), temperature source Oral, resp. rate 18, height 5\' 6"  (1.676 m), weight 88.86 kg (195 lb 14.4 oz), SpO2 98.00%. Strength out of 5 wound clean and dry  Disposition: home     Medication List         carvedilol 25 MG tablet  Commonly known as:  COREG  Take 25 mg by mouth 2 (two) times daily with a meal.     Chelated Magnesium 100 MG Tabs  Take 2 tablets by mouth daily.     gabapentin 400 MG capsule  Commonly known as:  NEURONTIN  Take 1 capsule (400 mg total) by mouth 3 (three) times daily.     GLUCOSAMINE 1500 COMPLEX PO  Take 2 tablets by mouth every morning.     hydrALAZINE 10 MG tablet  Commonly known as:  APRESOLINE  Take 10 mg by mouth daily.     hydrochlorothiazide 12.5 MG capsule  Commonly known as:  MICROZIDE  TAKE 1 CAPSULE DAILY     HYDROmorphone 2 MG tablet  Commonly known as:  DILAUDID  Take 1 tablet (2 mg total) by mouth every 2 (two) hours as needed for severe pain.     multivitamin per tablet  Take 1 tablet by mouth every morning.     omeprazole 40 MG capsule  Commonly known as:  PRILOSEC   Take 1 capsule (40 mg total) by mouth daily.     ondansetron 4 MG disintegrating tablet  Commonly known as:  ZOFRAN-ODT  Take 4 mg by mouth every 4 (four) hours as needed for nausea or vomiting.     OVER THE COUNTER MEDICATION  Take 1 capsule by mouth every morning. Mangosteen 475 mg     PATADAY 0.2 % Soln  Generic drug:  Olopatadine HCl  Place 1 drop into both eyes daily.     telmisartan 80 MG tablet  Commonly known as:  MICARDIS  Take 160 mg by mouth daily.     traMADol 50 MG tablet  Commonly known as:  ULTRAM  Take 50 mg by mouth every 6 (six) hours as needed for moderate pain.           Follow-up Information   Follow up with [facility], MD.   Specialty:  Neurosurgery   Contact information:   [address] [place] [address] [phone]       Signed: Roen Macgowan P 08/15/2014, 10:31 AM

## 2014-08-15 NOTE — Progress Notes (Signed)
Patient ID: [name], male   DOB: [birth year], 73 y.o.   MRN: [id number] Pain much better controlled  Strength out of 5 wound clean and dry  Discharge home

## 2014-08-24 DIAGNOSIS — M4300 Spondylolysis, site unspecified: Secondary | ICD-10-CM | POA: Insufficient documentation

## 2014-08-24 DIAGNOSIS — F988 Other specified behavioral and emotional disorders with onset usually occurring in childhood and adolescence: Secondary | ICD-10-CM | POA: Insufficient documentation

## 2014-08-30 ENCOUNTER — Telehealth: Payer: Self-pay | Admitting: Internal Medicine

## 2014-08-30 NOTE — Telephone Encounter (Signed)
error 

## 2014-08-31 NOTE — Telephone Encounter (Signed)
Pt had back surgery on 08/08/14.  According to pt, he was given a medication in the hospital that he was highly allergic to and caused hallucinations, double vision, dizziness, elevated temp, and decrease BP.  Since 08/08/14, pt has not had much of an appetite.  Has lost 25 lbs.  For the past two weeks, he states that his systolic blood pressure has not been over 100 and recently has been in the mid to high 70's.  He occasionally feels lightheaded when standing from sitting or lying position.  His blood pressure today at 1130 am was 111/63.  No complaints of dizziness today.    Advice:  Given patient's symptoms, he was advised to come in for an appointment to be evaluated.  Appointment scheduled with Mackie Pai, PA-C on 09/01/14 @ 11:30 am.  He was advised to bring record of blood pressure readings with him to his appointment.  Lastly, he was advised to go to ER if his symptoms worsen or fail to continue to improve.  Pt stated understanding and agreed.

## 2014-08-31 NOTE — Telephone Encounter (Signed)
Thank you, agree. 

## 2014-08-31 NOTE — Telephone Encounter (Signed)
Caller name: Daxen  Call back number:[phone]   Reason for call: pt contacted [facility] yesterday, and no one ever called him back.  Pt states he had an allergic reaction to a med that [facility] had given him.  Pt states that he was hallucinating and having double vision.  Pt's primary concern now is that since 8/17, he has lost 25 lbs and BP has dropped, 79/49 yesterday, and this is what prompted the call.  Now he gets light headed when he stands up. Pt is really concerned.  Please contact pt to triage.

## 2014-09-01 ENCOUNTER — Ambulatory Visit (INDEPENDENT_AMBULATORY_CARE_PROVIDER_SITE_OTHER): Payer: Medicare Other | Admitting: Medical

## 2014-09-01 ENCOUNTER — Encounter: Payer: Self-pay | Admitting: Medical

## 2014-09-01 VITALS — BP 121/76 | HR 73 | Temp 97.9°F | Ht 66.25 in | Wt 178.6 lb

## 2014-09-01 DIAGNOSIS — R634 Abnormal weight loss: Secondary | ICD-10-CM

## 2014-09-01 DIAGNOSIS — R140 Abdominal distension (gaseous): Secondary | ICD-10-CM

## 2014-09-01 DIAGNOSIS — I10 Essential (primary) hypertension: Secondary | ICD-10-CM

## 2014-09-01 DIAGNOSIS — R5381 Other malaise: Secondary | ICD-10-CM | POA: Diagnosis not present

## 2014-09-01 DIAGNOSIS — R1084 Generalized abdominal pain: Secondary | ICD-10-CM | POA: Diagnosis not present

## 2014-09-01 DIAGNOSIS — R5383 Other fatigue: Principal | ICD-10-CM

## 2014-09-01 DIAGNOSIS — R142 Eructation: Secondary | ICD-10-CM

## 2014-09-01 DIAGNOSIS — N139 Obstructive and reflux uropathy, unspecified: Secondary | ICD-10-CM

## 2014-09-01 DIAGNOSIS — R143 Flatulence: Secondary | ICD-10-CM

## 2014-09-01 DIAGNOSIS — R141 Gas pain: Secondary | ICD-10-CM

## 2014-09-01 LAB — CBC WITH DIFFERENTIAL/PLATELET
Basophils Absolute: 0.1 10*3/uL (ref 0.0–0.1)
Basophils Relative: 1.3 % (ref 0.0–3.0)
EOS PCT: 4.7 % (ref 0.0–5.0)
Eosinophils Absolute: 0.4 10*3/uL (ref 0.0–0.7)
HEMATOCRIT: 36.3 % — AB (ref 39.0–52.0)
Hemoglobin: 11.8 g/dL — ABNORMAL LOW (ref 13.0–17.0)
LYMPHS ABS: 1.9 10*3/uL (ref 0.7–4.0)
Lymphocytes Relative: 23 % (ref 12.0–46.0)
MCHC: 32.6 g/dL (ref 30.0–36.0)
MCV: 92.4 fl (ref 78.0–100.0)
Monocytes Absolute: 0.7 10*3/uL (ref 0.1–1.0)
Monocytes Relative: 8.4 % (ref 3.0–12.0)
Neutro Abs: 5.1 10*3/uL (ref 1.4–7.7)
Neutrophils Relative %: 62.6 % (ref 43.0–77.0)
PLATELETS: 308 10*3/uL (ref 150.0–400.0)
RBC: 3.93 Mil/uL — ABNORMAL LOW (ref 4.22–5.81)
RDW: 13 % (ref 11.5–15.5)
WBC: 8.2 10*3/uL (ref 4.0–10.5)

## 2014-09-01 LAB — COMPREHENSIVE METABOLIC PANEL
ALT: 16 U/L (ref 0–53)
AST: 18 U/L (ref 0–37)
Albumin: 3.7 g/dL (ref 3.5–5.2)
Alkaline Phosphatase: 141 U/L — ABNORMAL HIGH (ref 39–117)
BUN: 25 mg/dL — ABNORMAL HIGH (ref 6–23)
CALCIUM: 10 mg/dL (ref 8.4–10.5)
CHLORIDE: 102 meq/L (ref 96–112)
CO2: 30 meq/L (ref 19–32)
CREATININE: 1 mg/dL (ref 0.4–1.5)
GFR: 74.35 mL/min (ref >60.00)
Glucose, Bld: 87 mg/dL (ref 70–99)
POTASSIUM: 5.2 meq/L — AB (ref 3.5–5.1)
SODIUM: 138 meq/L (ref 135–145)
TOTAL PROTEIN: 6.3 g/dL (ref 6.0–8.3)
Total Bilirubin: 0.1 mg/dL — ABNORMAL LOW (ref 0.2–1.2)

## 2014-09-01 LAB — LIPASE: Lipase: 64 U/L — ABNORMAL HIGH (ref 11.0–59.0)

## 2014-09-01 LAB — PSA, MEDICARE: PSA: 1.02 ng/mL (ref 0.10–4.00)

## 2014-09-01 NOTE — Assessment & Plan Note (Signed)
Hx of but recently his bp levels have been low. So advised stop hctz. Check bp at home. If bp increase to 140/90 or higher restart hctz and notify us. Continue other bp meds.

## 2014-09-01 NOTE — Patient Instructions (Addendum)
For your low blood pressure, I want you to stop hctz but check your blood pressure. If you get reading above 140/90 or higher notify us and restart hctz.   For your weight loss, fatigue, occasional abdomen pain, and hesitant urination flow, I want to get labs.  For your   Vision focus problems over past 3 weeks please schedule appointment with your ophthalmologist.  If you get more fatigued, abdomen pain,  Or worse vision changes then ED eval.  Follow up this Monday or as needed.

## 2014-09-01 NOTE — Progress Notes (Signed)
Subjective:    Patient ID: [name], male    DOB: 05-16-1941, 73 y.o.   MRN: [id number]  HPI  Pt in for report some allergic reaction while he was in the hospital after he had lumbar back surgery/fusion. Pt states he was confused possible medication side effect. Cause of his side effects never determined. He states hospital is investigating this.  Pt states between end of July he has lost weight  from 200 to now 178 pounds. Pt states he feels very tired. Standing feels light headed at times.(Right now he feels good) No dark black stools. No blood in his stools. Pt has no abdominal pain presently but some in the past. Pt states he has been eating only one and half to 2 meals q day. Used to eat 3-4 meals a day. Pt has history of fundoplication in 6222. 3 esophageal dilations. Last colonscopy Oct 18, 2011.   Some mild difficult urine flow.  Pt bp have been as low as 75/43 to as high as 128/67. About a 1/3 of reading on lower side. Feels weak when he stands.  History of blurred vision. History of this. Focusing problems since was in hospital. Othalmologit at [facility]. Focal imbalance since birth. Pt will call his office for appointment soon. Will try to get by Monday upcoming.        Review of Systems  Constitutional: Positive for fatigue. Negative for fever, chills and unexpected weight change.  HENT: Negative.   Respiratory: Negative for cough, choking, chest tightness, shortness of breath and wheezing.   Cardiovascular: Negative for chest pain and palpitations.  Gastrointestinal: Negative for nausea, vomiting, abdominal pain, diarrhea, constipation, blood in stool, abdominal distention, anal bleeding and rectal pain.       Occasional intermittent abdominal pain. None presently.  Genitourinary:       Hesitant urine flow at times.  Musculoskeletal: Negative.   Skin: Positive for pallor.       Some since surgery.  Neurological: Positive for light-headedness. Negative  for dizziness, seizures, syncope, speech difficulty, weakness, numbness and headaches.       Sometimes on changing postions sitting to standing but only transiently.       Objective:   Physical Exam  Constitutional: He is oriented to person, place, and time. No distress.  Frail appearance today. But in good spiritis. Pleasant.   HENT:  Head: Normocephalic and atraumatic.  Eyes: Conjunctivae and EOM are normal. Pupils are equal, round, and reactive to light. Right eye exhibits no discharge. Left eye exhibits no discharge. No scleral icterus.  Neck: Normal range of motion. Neck supple. No JVD present. No tracheal deviation present. No thyromegaly present.  Cardiovascular: Normal rate, regular rhythm and normal heart sounds.  Exam reveals no gallop and no friction rub.   No murmur heard. Pulmonary/Chest: Effort normal and breath sounds normal. No stridor. No respiratory distress. He has no wheezes. He has no rales. He exhibits no tenderness.  Abdominal: Soft. Bowel sounds are normal. He exhibits no distension and no mass. There is no tenderness. There is no rebound and no guarding.  Lymphadenopathy:    He has no cervical adenopathy.  Neurological: He is alert and oriented to person, place, and time. No cranial nerve deficit. Coordination normal.  Skin: No rash noted. He is not diaphoretic. There is pallor.  Psychiatric: He has a normal mood and affect. Judgment and thought content normal.           Assessment & Plan:

## 2014-09-01 NOTE — Assessment & Plan Note (Signed)
Intermiittent abdominal bloating with some pain. Greater than 20 lb weight loss since lat July. So will also refer to GI.

## 2014-09-01 NOTE — Assessment & Plan Note (Signed)
Fairly significant in short period of time so will get labs today. Cbc is stat. Will include lipase. Note pt up to date on colonoscopy. Will get psa today since some urinary flow issues and family hx of prostate ca.

## 2014-09-05 ENCOUNTER — Telehealth: Payer: Self-pay | Admitting: Internal Medicine

## 2014-09-05 ENCOUNTER — Other Ambulatory Visit (INDEPENDENT_AMBULATORY_CARE_PROVIDER_SITE_OTHER): Payer: Medicare Other

## 2014-09-05 DIAGNOSIS — H5310 Unspecified subjective visual disturbances: Secondary | ICD-10-CM | POA: Diagnosis not present

## 2014-09-05 DIAGNOSIS — H2510 Age-related nuclear cataract, unspecified eye: Secondary | ICD-10-CM | POA: Diagnosis not present

## 2014-09-05 DIAGNOSIS — H11159 Pinguecula, unspecified eye: Secondary | ICD-10-CM | POA: Diagnosis not present

## 2014-09-05 DIAGNOSIS — E876 Hypokalemia: Secondary | ICD-10-CM

## 2014-09-05 DIAGNOSIS — H532 Diplopia: Secondary | ICD-10-CM | POA: Diagnosis not present

## 2014-09-05 DIAGNOSIS — IMO0002 Reserved for concepts with insufficient information to code with codable children: Secondary | ICD-10-CM | POA: Diagnosis not present

## 2014-09-05 LAB — COMPREHENSIVE METABOLIC PANEL
ALBUMIN: 3.5 g/dL (ref 3.5–5.2)
ALK PHOS: 117 U/L (ref 39–117)
ALT: 14 U/L (ref 0–53)
AST: 17 U/L (ref 0–37)
BUN: 19 mg/dL (ref 6–23)
CALCIUM: 9.1 mg/dL (ref 8.4–10.5)
CHLORIDE: 103 meq/L (ref 96–112)
CO2: 27 mEq/L (ref 19–32)
Creatinine, Ser: 1 mg/dL (ref 0.4–1.5)
GFR: 76.9 mL/min (ref >60.00)
Glucose, Bld: 98 mg/dL (ref 70–99)
POTASSIUM: 4.3 meq/L (ref 3.5–5.1)
Sodium: 137 mEq/L (ref 135–145)
Total Bilirubin: 0.3 mg/dL (ref 0.2–1.2)
Total Protein: 6.4 g/dL (ref 6.0–8.3)

## 2014-09-05 LAB — CBC WITH DIFFERENTIAL/PLATELET
BASOS ABS: 0.1 10*3/uL (ref 0.0–0.1)
Basophils Relative: 1 % (ref 0.0–3.0)
EOS ABS: 0.5 10*3/uL (ref 0.0–0.7)
Eosinophils Relative: 5.9 % — ABNORMAL HIGH (ref 0.0–5.0)
HCT: 36.1 % — ABNORMAL LOW (ref 39.0–52.0)
Hemoglobin: 11.9 g/dL — ABNORMAL LOW (ref 13.0–17.0)
LYMPHS PCT: 16.1 % (ref 12.0–46.0)
Lymphs Abs: 1.4 10*3/uL (ref 0.7–4.0)
MCHC: 33 g/dL (ref 30.0–36.0)
MCV: 91.9 fl (ref 78.0–100.0)
MONO ABS: 0.6 10*3/uL (ref 0.1–1.0)
Monocytes Relative: 6.7 % (ref 3.0–12.0)
NEUTROS PCT: 70.3 % (ref 43.0–77.0)
Neutro Abs: 6.2 10*3/uL (ref 1.4–7.7)
Platelets: 277 10*3/uL (ref 150.0–400.0)
RBC: 3.93 Mil/uL — ABNORMAL LOW (ref 4.22–5.81)
RDW: 13 % (ref 11.5–15.5)
WBC: 8.8 10*3/uL (ref 4.0–10.5)

## 2014-09-05 LAB — LIPASE: Lipase: 24 U/L (ref 11.0–59.0)

## 2014-09-05 NOTE — Telephone Encounter (Signed)
Pt states that he has lost about 20 pounds and has felt like he had a temporary blockage on 2 separate occasions. Pt has referral in system for appt. Pt scheduled to see Tye Savoy NP 09/07/14@2 :30pm. Pt aware of appt.

## 2014-09-07 ENCOUNTER — Encounter: Payer: Self-pay | Admitting: Nurse Practitioner

## 2014-09-07 ENCOUNTER — Telehealth: Payer: Self-pay | Admitting: Medical

## 2014-09-07 ENCOUNTER — Ambulatory Visit (INDEPENDENT_AMBULATORY_CARE_PROVIDER_SITE_OTHER): Payer: Medicare Other | Admitting: Nurse Practitioner

## 2014-09-07 VITALS — BP 116/64 | HR 68 | Ht 66.0 in | Wt 181.5 lb

## 2014-09-07 DIAGNOSIS — R634 Abnormal weight loss: Secondary | ICD-10-CM

## 2014-09-07 DIAGNOSIS — R112 Nausea with vomiting, unspecified: Secondary | ICD-10-CM

## 2014-09-07 NOTE — Patient Instructions (Signed)
Per your discussion with Tye Savoy NP-C, she will call you.

## 2014-09-07 NOTE — Telephone Encounter (Signed)
I talked with pt today and he states he is overall feeling a lot better. Called him in regards to his vision problems he reports seemed to start during his hosptialization. He went to he is opthalmologist recently and his eye MD stated his eyes were not the cause of his recent vision changes/ problems. Eye MD states was medical or neurologic issues. Pt does have baseline issue with left eye focus above rt eye per pt report. Pt states his vision was hazy and some double vision in hospital. When he speculates possible reaction to medicine. He had HA while in hospital as well. Now pt states vision is better. Less frequent hazy vision and less double vision. Although his glasses seem to make symptoms worse. No definite ha. Although he reports he has such a high threshold of pain he might not notice ha. He mentioned at end history of some blocked artery intracranial(About 25%.) I want him to schedule appointment for 8 am on 18th. Will evaluate and may order imaging studies and refer to neurologist. During the interim, I advised pt if he gets worse vision changes with worse ha or neurologic associated signs or symptoms then go to the ED.

## 2014-09-07 NOTE — Progress Notes (Signed)
History of Present Illness:   Patient is a 73 year old male with multiple medical problems known to [name] for a history of swallowing problems which began after neck surgery. He does have a history of GERD with peptic stricture. This past July he underwent EGD with maloney dilation of large distal esophageal ring.   Patient is referred for evaluation of weight loss. He reports a 23 pound weight loss since late last month. A review of records shows he was 193 at his last visit here in June and is 181 today. Though still having problems with dysphagia he doesn't attribute weight loss to that. He reports early satiety and describes two recent episodes of acute nausea, abdominal pain and abdominal distention without associated constipation or other bowel changes. The first episode was in late July  Patinet went to the emergency department where labs were unremarkable as was a. abd/pelvic CT scan with contrast.  It was suggested to patient  that he could of suffered a ""temporary blockage".A couple of weeks later, in mid-August,  patient underwent lumbar surgery. Postoperatively patient did okay but then on  Labor Day weekend he had a second episode of severe abdominal pain and distention. Symptoms again associated with nausea / heaves. That time patient was having some lose stools but that had been ongoing since neck surgery.  Patient heard a" pop" and abdominal pain and distention spontaneously resolved. No further bloating or pain but he has been left with early satiety / poor appetite.  While hospitalized for neck surgery patient experienced double vision. He is still having problems with double vision and headaches as well. PCP referred patient to ophthalmologist but workup apparently unremarkable. Patient has follow up appt with PCP Friday and thinks he will be getting a head scan and neurology referral.  .  Current Medications, Allergies, Past Medical History, Past Surgical History, Family  History and Social History were reviewed in [employer] record.   Physical Exam: General: Pleasant, well developed , white male in no acute distress Head: Normocephalic and atraumatic Eyes:  sclerae anicteric, conjunctiva pink  Ears: Normal auditory acuity Lungs: Clear throughout to auscultation Heart: Regular rate and rhythm Abdomen: Soft, non distended, non-tender. No masses, no hepatomegaly. Normal bowel sounds Musculoskeletal: Symmetrical with no gross deformities  Extremities: No edema  Neurological: Alert oriented x 4, grossly nonfocal Psychological:  Alert and cooperative. Normal mood and affect  Assessment and Recommendations:  84. 73 year old male with multiple medical problems. He presents with two recent acute episodes of abdominal pain, abdominal distention,  and nausea / vomiting. Patient felt fine before and in between these two episodes. Etiology of these acute episodes unclear and difficult to evaluate o consider would be partial SBO or ileus. Though it does sound obstructive in nature CTscan in ED was unrevealing. At this point I have asked patient to call if he has anGIother episode. It would be helpful to assess him during one of these episodes.   2. Weight loss. Patient reports recent 23 pound loss but reviewing records he has only lost about 12 pounds since June. Though he describes early satiety patient just had an EGD in July so I don't think repeat EGD would add much. Etiology of symptoms unclear. Patient isn't taking narcotics, he hasn't had any recent med changes and has no other ongoing GI symptoms. Patient does note that weight is up 2 pounds by today's scale. He should continue to monitor weight for now, I don't  think any GI studies are needed at this point.

## 2014-09-08 DIAGNOSIS — R112 Nausea with vomiting, unspecified: Secondary | ICD-10-CM | POA: Insufficient documentation

## 2014-09-08 DIAGNOSIS — M5126 Other intervertebral disc displacement, lumbar region: Secondary | ICD-10-CM | POA: Diagnosis not present

## 2014-09-09 ENCOUNTER — Ambulatory Visit (INDEPENDENT_AMBULATORY_CARE_PROVIDER_SITE_OTHER): Payer: Medicare Other | Admitting: Medical

## 2014-09-09 ENCOUNTER — Encounter: Payer: Self-pay | Admitting: Medical

## 2014-09-09 VITALS — BP 133/81 | HR 61 | Temp 98.3°F | Ht 67.2 in | Wt 180.4 lb

## 2014-09-09 DIAGNOSIS — J3089 Other allergic rhinitis: Secondary | ICD-10-CM | POA: Diagnosis not present

## 2014-09-09 DIAGNOSIS — H538 Other visual disturbances: Secondary | ICD-10-CM

## 2014-09-09 DIAGNOSIS — R51 Headache: Secondary | ICD-10-CM | POA: Diagnosis not present

## 2014-09-09 DIAGNOSIS — J302 Other seasonal allergic rhinitis: Secondary | ICD-10-CM

## 2014-09-09 MED ORDER — FLUTICASONE PROPIONATE 50 MCG/ACT NA SUSP: 2.0000 | Freq: Every day | NASAL | Status: DC

## 2014-09-09 NOTE — Patient Instructions (Addendum)
I will refer you to neurologist for evaluation of your blurred vision and mild HA. I will try to expedite referral. If your ha or vision changes or  worsen then ED evaluation. You have some mild nasal congestion so I sent some fluticasone to your pharmacy. We will see if this helps your frontal ha(which may be mild inflammation of frontal sinusitis). Follow up in 1 month or as needed.

## 2014-09-09 NOTE — Assessment & Plan Note (Signed)
Patient has frontal headache. This may represent some allergic rhinitis as he does report some congestion. Will send a prescription of fluticasone and see if this relieves the mild headache.

## 2014-09-09 NOTE — Progress Notes (Signed)
   Subjective:    Patient ID: [name], male    DOB: 03-24-1941, 73 y.o.   MRN: [id number]  HPI  Pt went to his back surgeon and he states healing well. Recent surgery has helped a lot. He has not had any pain medication for a month.h.   Pt did see GI NP recently. She reviewed. Recent dilation procedure and films. No gi complaints today  Pt still has on and off ha and some blurred vision/focus problems since hospitalization. Pt thinks allergic rxn to tylenol which is on record may be cause. Pt describes mild low grade ha frontal region. Pt does have some mild nasal congestion. Regarding his blurred vision pt saw his opthalmologist. He has baseline focus issue for years. Eye MD states he has medical or neurologic problem.  Pt seen by by neurologist in the past. He has 2 cerebral vessels blocked about 25%. Prior mra done 2013.    Review of Systems  Constitutional: Negative for fever, chills and fatigue.  HENT: Positive for congestion. Negative for ear discharge, ear pain, nosebleeds, postnasal drip, rhinorrhea, sinus pressure, sore throat, trouble swallowing and voice change.   Eyes:       Still on and off blurred vision  Respiratory: Negative for cough, chest tightness and wheezing.   Cardiovascular: Negative for chest pain and palpitations.  Gastrointestinal: Negative.   Musculoskeletal: Positive for back pain.       Much better now after surgery.  Neurological: Positive for headaches. Negative for dizziness, tremors, seizures, syncope, speech difficulty, weakness, light-headedness and numbness.       Faint frontal ha with mild blurred vision.   Hematological: Negative for adenopathy. Does not bruise/bleed easily.  Psychiatric/Behavioral: Negative for suicidal ideas, behavioral problems, dysphoric mood and decreased concentration. The patient is not nervous/anxious.        Objective:   Physical Exam  Constitutional: He is oriented to person, place, and time. He appears  well-developed and well-nourished.  HENT:  Head: Normocephalic and atraumatic.  Right Ear: External ear normal.  Left Ear: External ear normal.  Mouth/Throat: No oropharyngeal exudate.  Mild boggy turbinates.  Eyes: Conjunctivae and EOM are normal. Pupils are equal, round, and reactive to light.  Neck: Normal range of motion. Neck supple.  Cardiovascular: Normal rate, regular rhythm and normal heart sounds.  Exam reveals no gallop and no friction rub.   No murmur heard. Pulmonary/Chest: Effort normal and breath sounds normal. No respiratory distress. He has no wheezes. He has no rales. He exhibits no tenderness.  Abdominal: Soft. Bowel sounds are normal. He exhibits no distension and no mass. There is no tenderness. There is no rebound.  Neurological: He is alert and oriented to person, place, and time. No cranial nerve deficit. Coordination normal.  CN III-XII intact. Neg romberg. No gross motor or sensory function deficits. Equal and symmetric upper and lower ext strength 5/5 bilaterally. Finger to nose intact. Symmetric smile.  Psychiatric: He has a normal mood and affect. His behavior is normal. Judgment and thought content normal.            Assessment & Plan:

## 2014-09-09 NOTE — Progress Notes (Signed)
Agree with close observation for now

## 2014-09-09 NOTE — Assessment & Plan Note (Addendum)
Patient has had this since his hospitalization and he notes mild frontal headache associated with this. He does not report any gross motor or sensory function deficits. He has seen his ophthalmologist to evaluate is baseline focus problems which he's had for years. His ophthalmologist does not think this is related to the recent blurred vision. Rather per patient ophthalmologist states he might have a medical problem or neurologic cause. On review of his recent labs could not find any abnormality that I think would cause constant blurred vision. 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 That was seen on MRA about 2 years ago. Patient states his neurosurgeon is aware of this prior MRA and aware of recent symptoms regarding vision and headache. His neurosurgeon did not order repeat studies. Patient has seen neurologists in the past and they ordered the prior study. I will go ahead and make a referral and try to get him in relatively quickly. During the interim I did advise patient that if he gets worse headaches with vision changes then to be seen in the emergency department. Patient did agree with this plan.

## 2014-09-14 ENCOUNTER — Ambulatory Visit (INDEPENDENT_AMBULATORY_CARE_PROVIDER_SITE_OTHER): Payer: Medicare Other | Admitting: Neurology

## 2014-09-14 ENCOUNTER — Encounter: Payer: Self-pay | Admitting: Neurology

## 2014-09-14 ENCOUNTER — Ambulatory Visit
Admission: RE | Admit: 2014-09-14 | Discharge: 2014-09-14 | Disposition: A | Discharge status: Home / Self Care | Payer: Medicare Other | Source: Ambulatory Visit | Attending: Neurology | Admitting: Neurology

## 2014-09-14 VITALS — BP 122/64 | HR 68 | Temp 97.7°F | Resp 18 | Ht 67.0 in | Wt 183.5 lb

## 2014-09-14 DIAGNOSIS — G4452 New daily persistent headache (NDPH): Secondary | ICD-10-CM

## 2014-09-14 DIAGNOSIS — H532 Diplopia: Secondary | ICD-10-CM | POA: Diagnosis not present

## 2014-09-14 DIAGNOSIS — R51 Headache: Secondary | ICD-10-CM | POA: Diagnosis not present

## 2014-09-14 DIAGNOSIS — R443 Hallucinations, unspecified: Secondary | ICD-10-CM | POA: Diagnosis not present
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
## 2014-09-14 NOTE — Patient Instructions (Signed)
I think the persistent headache is likely related to the long recovery from surgery and the fact that you haven't been eating much. 1.  To be sure, we will get MRI of the brain 2.  Limit use of tramadol to no more than 2 days out of the week to prevent rebound headache 3.  Follow up in 6 weeks for re-evaluation.  If headaches still persist, we can start a daily medication, such as nortriptyline to try and break the cycle and reduce frequency.

## 2014-09-15 ENCOUNTER — Telehealth: Payer: Self-pay | Admitting: *Deleted

## 2014-09-15 ENCOUNTER — Telehealth: Payer: Self-pay | Admitting: Nurse Practitioner

## 2014-09-15 NOTE — Telephone Encounter (Signed)
Patient was told at [place] that Tye Savoy, NP would be calling him. He is asking to be called.

## 2014-09-15 NOTE — Telephone Encounter (Signed)
Message copied by Claudie Revering on Thu Sep 15, 2014  2:32 PM ------      Message from: JAFFE, ADAM R      Created: Wed Sep 14, 2014  2:44 PM       MRI looks okay      ----- Message -----         From: Rad Results In Interface         Sent: 09/14/2014   2:03 PM           To: Dudley Major, DO                   ------

## 2014-09-15 NOTE — Telephone Encounter (Signed)
Patient is aware of normal MRI 

## 2014-09-16 ENCOUNTER — Telehealth: Payer: Self-pay | Admitting: *Deleted

## 2014-09-16 DIAGNOSIS — R634 Abnormal weight loss: Secondary | ICD-10-CM

## 2014-09-16 DIAGNOSIS — R112 Nausea with vomiting, unspecified: Secondary | ICD-10-CM

## 2014-09-16 NOTE — Telephone Encounter (Signed)
Scheduled GES at [facility] radiology on 09/26/14 at 9:30 AM(tony). NPO after midnight. No stomach medications after midnight. Patient notified and given date, time and instructions.

## 2014-09-16 NOTE — Telephone Encounter (Signed)
Message copied by Hulan Saas on Fri Sep 16, 2014  4:11 PM ------      Message from: Willia Craze      Created: Fri Sep 16, 2014  3:49 PM       Rollene Fare, please see Dr. Blanch Media response and let patient know. Thanks      ----- Message -----         From: Irene Shipper, MD         Sent: 09/15/2014  12:35 PM           To: Willia Craze, NP            Not certain, but may be in large part the postoperative recovery phase and require time and patience. Based on complaints,however, please check a solid phase gastric emptying scan to r/o gastroparesis. Thanks. jp      ----- Message -----         From: Willia Craze, NP         Sent: 09/15/2014   9:43 AM           To: Irene Shipper, MD            Do you have any ideas about etiologies of this patient's symptoms. He is calling to see what we thought this might be and I am really not sure.      Thanks      PG             ------

## 2014-09-20 ENCOUNTER — Other Ambulatory Visit (INDEPENDENT_AMBULATORY_CARE_PROVIDER_SITE_OTHER): Payer: Medicare Other

## 2014-09-20 DIAGNOSIS — R634 Abnormal weight loss: Secondary | ICD-10-CM

## 2014-09-20 LAB — POC HEMOCCULT BLD/STL (HOME/3-CARD/SCREEN)
FECAL OCCULT BLD: NEGATIVE
FECAL OCCULT BLD: NEGATIVE
Fecal Occult Blood, POC: NEGATIVE

## 2014-09-22 NOTE — Telephone Encounter (Signed)
Nurse called patient. See other phone note on 09-16-14.

## 2014-09-23 ENCOUNTER — Ambulatory Visit: Payer: Medicare Other | Admitting: Neurology

## 2014-09-24 ENCOUNTER — Encounter: Payer: Self-pay | Admitting: Internal Medicine

## 2014-09-26 ENCOUNTER — Encounter (HOSPITAL_COMMUNITY): Payer: Self-pay

## 2014-09-26 ENCOUNTER — Ambulatory Visit (HOSPITAL_COMMUNITY)
Admission: RE | Admit: 2014-09-26 | Discharge: 2014-09-26 | Disposition: A | Discharge status: Home / Self Care | Payer: Medicare Other | Source: Ambulatory Visit | Attending: Nurse Practitioner | Admitting: Nurse Practitioner

## 2014-09-26 DIAGNOSIS — R6881 Early satiety: Secondary | ICD-10-CM | POA: Insufficient documentation

## 2014-09-26 DIAGNOSIS — R112 Nausea with vomiting, unspecified: Secondary | ICD-10-CM | POA: Diagnosis not present

## 2014-09-26 DIAGNOSIS — R140 Abdominal distension (gaseous): Secondary | ICD-10-CM | POA: Insufficient documentation

## 2014-09-26 DIAGNOSIS — R634 Abnormal weight loss: Secondary | ICD-10-CM | POA: Insufficient documentation

## 2014-09-26 DIAGNOSIS — R110 Nausea: Secondary | ICD-10-CM | POA: Diagnosis not present

## 2014-09-26 DIAGNOSIS — Z9889 Other specified postprocedural states: Secondary | ICD-10-CM | POA: Diagnosis not present

## 2014-09-26 MED ORDER — TECHNETIUM TC 99M SULFUR COLLOID
2.2000 | Freq: Once | INTRAVENOUS | Status: AC | PRN
  Administered 2014-09-26: 2.2 via INTRAVENOUS

## 2014-09-26 NOTE — Telephone Encounter (Signed)
I reviewed chart and found no note about labs. Saw that Dr. Larose Kells got  request to handle.

## 2014-10-13 ENCOUNTER — Encounter: Payer: Self-pay | Admitting: Internal Medicine

## 2014-10-18 DIAGNOSIS — Z6829 Body mass index (BMI) 29.0-29.9, adult: Secondary | ICD-10-CM | POA: Diagnosis not present

## 2014-10-18 DIAGNOSIS — I10 Essential (primary) hypertension: Secondary | ICD-10-CM | POA: Diagnosis not present

## 2014-10-18 DIAGNOSIS — M4806 Spinal stenosis, lumbar region: Secondary | ICD-10-CM | POA: Diagnosis not present

## 2014-10-18 DIAGNOSIS — Z48811 Encounter for surgical aftercare following surgery on the nervous system: Secondary | ICD-10-CM | POA: Diagnosis not present

## 2014-10-19 DIAGNOSIS — M4806 Spinal stenosis, lumbar region: Secondary | ICD-10-CM | POA: Diagnosis not present

## 2014-10-20 ENCOUNTER — Encounter: Payer: Self-pay | Admitting: Internal Medicine

## 2014-10-20 ENCOUNTER — Ambulatory Visit (HOSPITAL_BASED_OUTPATIENT_CLINIC_OR_DEPARTMENT_OTHER)
Admission: RE | Admit: 2014-10-20 | Discharge: 2014-10-20 | Disposition: A | Discharge status: Home / Self Care | Payer: Medicare Other | Source: Ambulatory Visit | Attending: Diagnostic Radiology | Admitting: Diagnostic Radiology

## 2014-10-20 ENCOUNTER — Ambulatory Visit (INDEPENDENT_AMBULATORY_CARE_PROVIDER_SITE_OTHER): Payer: Medicare Other | Admitting: Internal Medicine

## 2014-10-20 VITALS — BP 185/97 | HR 59 | Temp 97.7°F | Ht 67.0 in | Wt 186.0 lb

## 2014-10-20 DIAGNOSIS — R112 Nausea with vomiting, unspecified: Secondary | ICD-10-CM

## 2014-10-20 DIAGNOSIS — G4452 New daily persistent headache (NDPH): Secondary | ICD-10-CM | POA: Diagnosis not present

## 2014-10-20 DIAGNOSIS — Z981 Arthrodesis status: Secondary | ICD-10-CM | POA: Insufficient documentation

## 2014-10-20 DIAGNOSIS — D62 Acute posthemorrhagic anemia: Secondary | ICD-10-CM

## 2014-10-20 DIAGNOSIS — R609 Edema, unspecified: Secondary | ICD-10-CM | POA: Diagnosis not present

## 2014-10-20 DIAGNOSIS — I10 Essential (primary) hypertension: Secondary | ICD-10-CM

## 2014-10-20 DIAGNOSIS — Z0000 Encounter for general adult medical examination without abnormal findings: Secondary | ICD-10-CM

## 2014-10-20 DIAGNOSIS — R600 Localized edema: Secondary | ICD-10-CM | POA: Diagnosis not present

## 2014-10-20 LAB — CBC WITH DIFFERENTIAL/PLATELET
Basophils Absolute: 0.1 10*3/uL (ref 0.0–0.1)
Basophils Relative: 0.9 % (ref 0.0–3.0)
EOS ABS: 0.3 10*3/uL (ref 0.0–0.7)
Eosinophils Relative: 3.5 % (ref 0.0–5.0)
HCT: 42.3 % (ref 39.0–52.0)
HEMOGLOBIN: 13.8 g/dL (ref 13.0–17.0)
Lymphocytes Relative: 17.3 % (ref 12.0–46.0)
Lymphs Abs: 1.4 10*3/uL (ref 0.7–4.0)
MCHC: 32.5 g/dL (ref 30.0–36.0)
MCV: 90.4 fl (ref 78.0–100.0)
MONOS PCT: 7.5 % (ref 3.0–12.0)
Monocytes Absolute: 0.6 10*3/uL (ref 0.1–1.0)
NEUTROS ABS: 5.8 10*3/uL (ref 1.4–7.7)
NEUTROS PCT: 70.8 % (ref 43.0–77.0)
Platelets: 261 10*3/uL (ref 150.0–400.0)
RBC: 4.68 Mil/uL (ref 4.22–5.81)
RDW: 14.4 % (ref 11.5–15.5)
WBC: 8.1 10*3/uL (ref 4.0–10.5)

## 2014-10-20 LAB — FERRITIN: Ferritin: 31.9 ng/mL (ref 22.0–322.0)

## 2014-10-20 MED ORDER — NORTRIPTYLINE HCL 10 MG PO CAPS: ORAL_CAPSULE | ORAL | Status: DC

## 2014-10-20 NOTE — Progress Notes (Signed)
Pre visit review using our clinic review tool, if applicable. No additional management support is needed unless otherwise documented below in the visit note. 

## 2014-10-20 NOTE — Assessment & Plan Note (Signed)
Continue w/ on-off GI symptoms,  normal gastric emptying study 09-2014 Reports "white stools", recent LFTs normal. He does have some anemia. Plan: Keep appointment with GI 11/21/2014. Check CBC iron-ferritn
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
## 2014-10-20 NOTE — Patient Instructions (Signed)
Get your blood work before you leave    Please come back to the office in 1 month for a physical exam. Come back fasting

## 2014-10-20 NOTE — Assessment & Plan Note (Signed)
Persisting HA, neuro note reviewed, plan: Start nortriptyline Keep 10-26-14  neurology visit

## 2014-10-20 NOTE — Assessment & Plan Note (Addendum)
Will do a cpx on RTC

## 2014-10-20 NOTE — Assessment & Plan Note (Signed)
Hypertension, hydrochlorothiazide was recently discontinued, BP elevated today however ambulatory BPs are excellent. No change.

## 2014-10-21 ENCOUNTER — Ambulatory Visit: Payer: Medicare Other | Admitting: Neurology

## 2014-10-21 ENCOUNTER — Encounter (HOSPITAL_COMMUNITY): Payer: Medicare Other

## 2014-10-21 LAB — IRON: IRON: 90 ug/dL (ref 42–165)

## 2014-10-23 NOTE — Addendum Note (Signed)
Addended by: Kathlene November E on: 10/23/2014 04:31 PM   Modules accepted: Miquel Dunn

## 2014-10-24 ENCOUNTER — Encounter (HOSPITAL_COMMUNITY): Payer: Medicare Other

## 2014-10-25 ENCOUNTER — Ambulatory Visit (INDEPENDENT_AMBULATORY_CARE_PROVIDER_SITE_OTHER): Payer: Medicare Other | Admitting: Cardiovascular Disease

## 2014-10-25 ENCOUNTER — Encounter: Payer: Self-pay | Admitting: Cardiovascular Disease

## 2014-10-25 VITALS — BP 172/78 | HR 64 | Ht 67.0 in | Wt 187.0 lb

## 2014-10-25 DIAGNOSIS — I10 Essential (primary) hypertension: Secondary | ICD-10-CM | POA: Diagnosis not present

## 2014-10-25 DIAGNOSIS — I701 Atherosclerosis of renal artery: Secondary | ICD-10-CM

## 2014-10-25 MED ORDER — HYDROCHLOROTHIAZIDE 12.5 MG PO CAPS: 12.5000 mg | ORAL_CAPSULE | Freq: Every day | ORAL | Status: DC

## 2014-10-25 NOTE — Assessment & Plan Note (Signed)
Blood pressure has been elevated lately after discontinuation of hydrochlorothiazide. Thus, I resume hydrochlorothiazide but at a lower dose of 12.5 mg once daily he is to continue monitoring blood pressure and notifying us if readings are not controlled.

## 2014-10-25 NOTE — Assessment & Plan Note (Signed)
Given recent worsening of blood pressure control, I requested renal artery duplex to evaluate for progression of renal artery stenosis.
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
## 2014-10-25 NOTE — Patient Instructions (Signed)
Your physician has recommended you make the following change in your medication:  1. START HCTZ 12.5 mg take one by mouth daily  Your physician has requested that you have a renal artery duplex. During this test, an ultrasound is used to evaluate blood flow to the kidneys. Allow one hour for this exam. Do not eat after midnight the day before and avoid carbonated beverages. Take your medications as you usually do.  Your physician wants you to follow-up in: 6 MONTHS with Dr Fletcher Anon.  You will receive a reminder letter in the mail two months in advance. If you don't receive a letter, please call our office to schedule the follow-up appointment.

## 2014-10-26 ENCOUNTER — Encounter: Payer: Self-pay | Admitting: Neurology

## 2014-10-26 ENCOUNTER — Ambulatory Visit (INDEPENDENT_AMBULATORY_CARE_PROVIDER_SITE_OTHER): Payer: Medicare Other | Admitting: Neurology

## 2014-10-26 ENCOUNTER — Ambulatory Visit (HOSPITAL_COMMUNITY): Payer: Medicare Other | Attending: Cardiovascular Disease | Admitting: Cardiology

## 2014-10-26 VITALS — BP 140/82 | HR 60 | Resp 16 | Wt 182.0 lb

## 2014-10-26 DIAGNOSIS — Z87891 Personal history of nicotine dependence: Secondary | ICD-10-CM | POA: Diagnosis not present

## 2014-10-26 DIAGNOSIS — E785 Hyperlipidemia, unspecified: Secondary | ICD-10-CM | POA: Insufficient documentation

## 2014-10-26 DIAGNOSIS — I10 Essential (primary) hypertension: Secondary | ICD-10-CM

## 2014-10-26 DIAGNOSIS — I701 Atherosclerosis of renal artery: Secondary | ICD-10-CM

## 2014-10-26 DIAGNOSIS — G44229 Chronic tension-type headache, not intractable: Secondary | ICD-10-CM | POA: Diagnosis not present

## 2014-10-26 NOTE — Progress Notes (Signed)
Renal artery duplex performed  

## 2014-10-26 NOTE — Patient Instructions (Signed)
Please follow up as needed 
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
## 2014-10-27 DIAGNOSIS — M5126 Other intervertebral disc displacement, lumbar region: Secondary | ICD-10-CM | POA: Diagnosis not present

## 2014-10-27 DIAGNOSIS — M4806 Spinal stenosis, lumbar region: Secondary | ICD-10-CM | POA: Diagnosis not present

## 2014-11-01 ENCOUNTER — Encounter: Payer: Self-pay | Admitting: Cardiovascular Disease

## 2014-11-01 NOTE — Telephone Encounter (Signed)
This encounter was created in error - please disregard.

## 2014-11-01 NOTE — Telephone Encounter (Signed)
New message  ° ° °Returning call back to nurse.  °

## 2014-11-14 ENCOUNTER — Other Ambulatory Visit: Payer: Self-pay

## 2014-11-14 ENCOUNTER — Other Ambulatory Visit: Payer: Self-pay | Admitting: Internal Medicine

## 2014-11-14 ENCOUNTER — Encounter: Payer: Self-pay | Admitting: Internal Medicine

## 2014-11-14 MED ORDER — VALACYCLOVIR HCL 1 G PO TABS: ORAL_TABLET | ORAL | Status: DC

## 2014-11-15 ENCOUNTER — Ambulatory Visit: Payer: Medicare Other | Admitting: Cardiovascular Disease

## 2014-11-21 ENCOUNTER — Ambulatory Visit (INDEPENDENT_AMBULATORY_CARE_PROVIDER_SITE_OTHER): Payer: Medicare Other | Admitting: Internal Medicine

## 2014-11-21 ENCOUNTER — Encounter: Payer: Self-pay | Admitting: Internal Medicine

## 2014-11-21 VITALS — BP 122/64 | HR 60 | Ht 66.0 in | Wt 186.1 lb

## 2014-11-21 DIAGNOSIS — I701 Atherosclerosis of renal artery: Secondary | ICD-10-CM | POA: Diagnosis not present

## 2014-11-21 DIAGNOSIS — R634 Abnormal weight loss: Secondary | ICD-10-CM

## 2014-11-21 DIAGNOSIS — R140 Abdominal distension (gaseous): Secondary | ICD-10-CM

## 2014-11-21 DIAGNOSIS — R1310 Dysphagia, unspecified: Secondary | ICD-10-CM

## 2014-11-21 DIAGNOSIS — R143 Flatulence: Secondary | ICD-10-CM | POA: Diagnosis not present

## 2014-11-21 DIAGNOSIS — K219 Gastro-esophageal reflux disease without esophagitis: Secondary | ICD-10-CM | POA: Diagnosis not present

## 2014-11-21 MED ORDER — METRONIDAZOLE 250 MG PO TABS: 250.0000 mg | ORAL_TABLET | Freq: Three times a day (TID) | ORAL | Status: DC
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
## 2014-11-21 NOTE — Patient Instructions (Signed)
We have sent the following medications to your pharmacy for you to pick up at your convenience:  Flagyl  Please follow up with [name] as needed

## 2014-11-28 ENCOUNTER — Other Ambulatory Visit: Payer: Self-pay | Admitting: Internal Medicine

## 2014-12-05 ENCOUNTER — Encounter: Payer: Self-pay | Admitting: Internal Medicine

## 2014-12-05 ENCOUNTER — Ambulatory Visit (INDEPENDENT_AMBULATORY_CARE_PROVIDER_SITE_OTHER): Payer: Medicare Other | Admitting: Internal Medicine

## 2014-12-05 VITALS — BP 145/69 | HR 56 | Temp 97.7°F | Ht 66.0 in | Wt 184.1 lb

## 2014-12-05 DIAGNOSIS — Z23 Encounter for immunization: Secondary | ICD-10-CM | POA: Diagnosis not present

## 2014-12-05 DIAGNOSIS — E785 Hyperlipidemia, unspecified: Secondary | ICD-10-CM

## 2014-12-05 DIAGNOSIS — M899 Disorder of bone, unspecified: Secondary | ICD-10-CM | POA: Diagnosis not present

## 2014-12-05 DIAGNOSIS — Z0000 Encounter for general adult medical examination without abnormal findings: Secondary | ICD-10-CM | POA: Diagnosis not present

## 2014-12-05 DIAGNOSIS — I701 Atherosclerosis of renal artery: Secondary | ICD-10-CM

## 2014-12-05 DIAGNOSIS — R600 Localized edema: Secondary | ICD-10-CM | POA: Diagnosis not present

## 2014-12-05 DIAGNOSIS — R7309 Other abnormal glucose: Secondary | ICD-10-CM | POA: Diagnosis not present

## 2014-12-05 DIAGNOSIS — G4452 New daily persistent headache (NDPH): Secondary | ICD-10-CM

## 2014-12-05 DIAGNOSIS — I10 Essential (primary) hypertension: Secondary | ICD-10-CM | POA: Diagnosis not present

## 2014-12-05 DIAGNOSIS — R7303 Prediabetes: Secondary | ICD-10-CM

## 2014-12-05 DIAGNOSIS — M949 Disorder of cartilage, unspecified: Secondary | ICD-10-CM

## 2014-12-05 DIAGNOSIS — R112 Nausea with vomiting, unspecified: Secondary | ICD-10-CM

## 2014-12-05 LAB — BASIC METABOLIC PANEL
BUN: 16 mg/dL (ref 6–23)
CALCIUM: 9.5 mg/dL (ref 8.4–10.5)
CO2: 31 mEq/L (ref 19–32)
Chloride: 104 mEq/L (ref 96–112)
Creatinine, Ser: 0.8 mg/dL (ref 0.4–1.5)
GFR: 95.06 mL/min (ref >60.00)
GLUCOSE: 96 mg/dL (ref 70–99)
Potassium: 4 mEq/L (ref 3.5–5.1)
SODIUM: 139 meq/L (ref 135–145)

## 2014-12-05 LAB — HEMOGLOBIN A1C: Hgb A1c MFr Bld: 6.3 % (ref 4.6–6.5)

## 2014-12-05 LAB — LIPID PANEL
CHOLESTEROL: 174 mg/dL (ref 0–200)
HDL: 32.7 mg/dL — ABNORMAL LOW (ref >39.00)
LDL Cholesterol: 112 mg/dL — ABNORMAL HIGH (ref 0–99)
NonHDL: 141.3
TRIGLYCERIDES: 148 mg/dL (ref 0.0–149.0)
Total CHOL/HDL Ratio: 5
VLDL: 29.6 mg/dL (ref 0.0–40.0)

## 2014-12-05 NOTE — Assessment & Plan Note (Signed)
Declined to take nortriptyline as I prescribed, saw neurology, he was not interested in pursuing preventive treatment. Neurology will see as needed

## 2014-12-05 NOTE — Progress Notes (Signed)
Pre visit review using our clinic review tool, if applicable. No additional management support is needed unless otherwise documented below in the visit note. 

## 2014-12-05 NOTE — Assessment & Plan Note (Addendum)
Tdap 2012 Had a flu shot  pneumonia shot 03-2009 prevnar-- today shingles shot -- 2008  Cscope several Cscopes due to polyps, Cscope  12-07, TICS , no polyp; Cscope 09-2011: 3 polyps, next per GI  Unable to exercise as much as he likes due to pain.  Counseling about diet. DRE 2013 normal, last PSA normal 2014. We agreed to reassess need for prostate cancer screening next year

## 2014-12-05 NOTE — Assessment & Plan Note (Signed)
Saw GI, had round of antibiotics, feeling

## 2014-12-05 NOTE — Assessment & Plan Note (Signed)
see previous entry, apparently no recent dexa---- will schedule one

## 2014-12-05 NOTE — Patient Instructions (Signed)
Get your blood work before you leave    Please come back to the office in 4-5 months  for a routine check up     [place] cause injuries and can affect all age groups. It is possible to use preventive measures to significantly decrease the likelihood of falls. There are many simple measures which can make your home safer and prevent falls. OUTDOORS  Repair cracks and edges of walkways and driveways.  Remove high doorway thresholds.  Trim shrubbery on the main path into your home.  Have good outside lighting.  Clear walkways of tools, rocks, debris, and clutter.  Check that handrails are not broken and are securely fastened. Both sides of steps should have handrails.  Have leaves, snow, and ice cleared regularly.  Use sand or salt on walkways during winter months.  In the garage, clean up grease or oil spills. BATHROOM  Install night lights.  Install grab bars by the toilet and in the tub and shower.  Use non-skid mats or decals in the tub or shower.  Place a plastic non-slip stool in the shower to sit on, if needed.  Keep floors dry and clean up all water on the floor immediately.  Remove soap buildup in the tub or shower on a regular basis.  Secure bath mats with non-slip, double-sided rug tape.  Remove throw rugs and tripping hazards from the floors. BEDROOMS  Install night lights.  Make sure a bedside light is easy to reach.  Do not use oversized bedding.  Keep a telephone by your bedside.  Have a firm chair with side arms to use for getting dressed.  Remove throw rugs and tripping hazards from the floor. KITCHEN  Keep handles on pots and pans turned toward the center of the stove. Use back burners when possible.  Clean up spills quickly and allow time for drying.  Avoid walking on wet floors.  Avoid hot utensils and knives.  Position shelves so they are not too high or low.  Place commonly used objects within easy  reach.  If necessary, use a sturdy step stool with a grab bar when reaching.  Keep electrical cables out of the way.  Do not use floor polish or wax that makes floors slippery. If you must use wax, use non-skid floor wax.  Remove throw rugs and tripping hazards from the floor. STAIRWAYS  Never leave objects on stairs.  Place handrails on both sides of stairways and use them. Fix any loose handrails. Make sure handrails on both sides of the stairways are as long as the stairs.  Check carpeting to make sure it is firmly attached along stairs. Make repairs to worn or loose carpet promptly.  Avoid placing throw rugs at the top or bottom of stairways, or properly secure the rug with carpet tape to prevent slippage. Get rid of throw rugs, if possible.  Have an electrician put in a light switch at the top and bottom of the stairs. OTHER FALL PREVENTION TIPS  Wear low-heel or rubber-soled shoes that are supportive and fit well. Wear closed toe shoes.  When using a stepladder, make sure it is fully opened and both spreaders are firmly locked. Do not climb a closed stepladder.  Add color or contrast paint or tape to grab bars and handrails in your home. Place contrasting color strips on first and last steps.  Learn and use mobility aids as needed. Install an electrical emergency response system.  Turn on lights  to avoid dark areas. Replace light bulbs that burn out immediately. Get light switches that glow.  Arrange furniture to create clear pathways. Keep furniture in the same place.  Firmly attach carpet with non-skid or double-sided tape.  Eliminate uneven floor surfaces.  Select a carpet pattern that does not visually hide the edge of steps.  Be aware of all pets. OTHER HOME SAFETY TIPS  Set the water temperature for 120 F (48.8 C).  Keep emergency numbers on or near the telephone.  Keep smoke detectors on every level of the home and near sleeping areas. Document Released:  11/29/2002 Document Revised: 06/09/2012 Document Reviewed: 02/28/2012 [facility] Patient Information 2015 [place], [place]. This information is not intended to replace advice given to you by your health care provider. Make sure you discuss any questions you have with your health care provider.    Preventive Care for Adults    Ages 70 and over  Blood pressure check.** / Every 1 to 2 years.  Lipid and cholesterol check.**/ Every 5 years beginning at age 66.  Lung cancer screening. / Every year if you are aged 45-80 years and have a 30-pack-year history of smoking and currently smoke or have quit within the past 15 years. Yearly screening is stopped once you have quit smoking for at least 15 years or develop a health problem that would prevent you from having lung cancer treatment.  Fecal occult blood test (FOBT) of stool. / Every year beginning at age 7 and continuing until age 109. You may not have to do this test if you get a colonoscopy every 10 years.  Flexible sigmoidoscopy** or colonoscopy.** / Every 5 years for a flexible sigmoidoscopy or every 10 years for a colonoscopy beginning at age 28 and continuing until age 33.  Hepatitis C blood test.** / For all people born from [birth year] through 1965 and any individual with known risks for hepatitis C.  Abdominal aortic aneurysm (AAA) screening.** / A one-time screening for ages 30 to 36 years who are current or former smokers.  Skin self-exam. / Monthly.  Influenza vaccine. / Every year.  Tetanus, diphtheria, and acellular pertussis (Tdap/Td) vaccine.** / 1 dose of Td every 10 years.  Varicella vaccine.** / Consult your health care provider.  Zoster vaccine.** / 1 dose for adults aged 88 years or older.  Pneumococcal 13-valent conjugate (PCV13) vaccine.** / Consult your health care provider.  Pneumococcal polysaccharide (PPSV23) vaccine.** / 1 dose for all adults aged 8 years and older.  Meningococcal vaccine.** / Consult your health  care provider.  Hepatitis A vaccine.** / Consult your health care provider.  Hepatitis B vaccine.** / Consult your health care provider.  Haemophilus influenzae type b (Hib) vaccine.** / Consult your health care provider. **Family history and personal history of risk and conditions may change your health care provider's recommendations. Document Released: 02/04/2002 Document Revised: 12/14/2013 Document Reviewed: 05/06/2011 [facility] Patient Information 2015 [place], [place]. This information is not intended to replace advice given to you by your health care provider. Make sure you discuss any questions you have with your health care provider.

## 2014-12-05 NOTE — Assessment & Plan Note (Signed)
Problem started after back surgery 07-2014, ultrasound was negative for DVT, recommend leg elevation and a use of compression stocking
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
## 2014-12-05 NOTE — Assessment & Plan Note (Signed)
recently cardiology restarted HCTZ, BP was a slightly elevated, now getting great readings. Check a BMP, continue with Micardis 80 mg twice a day, hydralazine, HCTZ.

## 2014-12-06 ENCOUNTER — Other Ambulatory Visit: Payer: Self-pay | Admitting: Neurosurgery

## 2014-12-06 DIAGNOSIS — M4806 Spinal stenosis, lumbar region: Secondary | ICD-10-CM | POA: Diagnosis not present

## 2014-12-06 DIAGNOSIS — M48061 Spinal stenosis, lumbar region without neurogenic claudication: Secondary | ICD-10-CM

## 2014-12-06 DIAGNOSIS — M545 Low back pain: Secondary | ICD-10-CM | POA: Diagnosis not present

## 2014-12-06 DIAGNOSIS — I10 Essential (primary) hypertension: Secondary | ICD-10-CM | POA: Diagnosis not present

## 2014-12-06 DIAGNOSIS — M4807 Spinal stenosis, lumbosacral region: Secondary | ICD-10-CM | POA: Diagnosis not present

## 2014-12-06 DIAGNOSIS — Z6836 Body mass index (BMI) 36.0-36.9, adult: Secondary | ICD-10-CM | POA: Diagnosis not present

## 2014-12-07 ENCOUNTER — Ambulatory Visit
Admission: RE | Admit: 2014-12-07 | Discharge: 2014-12-07 | Disposition: A | Discharge status: Home / Self Care | Payer: Medicare Other | Source: Ambulatory Visit | Attending: Neurosurgery | Admitting: Neurosurgery

## 2014-12-07 DIAGNOSIS — M48061 Spinal stenosis, lumbar region without neurogenic claudication: Secondary | ICD-10-CM

## 2014-12-07 DIAGNOSIS — M4806 Spinal stenosis, lumbar region: Secondary | ICD-10-CM | POA: Diagnosis not present

## 2014-12-09 ENCOUNTER — Ambulatory Visit (INDEPENDENT_AMBULATORY_CARE_PROVIDER_SITE_OTHER)
Admission: RE | Admit: 2014-12-09 | Discharge: 2014-12-09 | Disposition: A | Discharge status: Home / Self Care | Payer: Medicare Other | Source: Ambulatory Visit | Attending: Internal Medicine | Admitting: Internal Medicine

## 2014-12-09 DIAGNOSIS — M899 Disorder of bone, unspecified: Secondary | ICD-10-CM

## 2014-12-09 DIAGNOSIS — M949 Disorder of cartilage, unspecified: Secondary | ICD-10-CM | POA: Diagnosis not present

## 2014-12-22 DIAGNOSIS — I10 Essential (primary) hypertension: Secondary | ICD-10-CM | POA: Diagnosis not present

## 2014-12-22 DIAGNOSIS — M5416 Radiculopathy, lumbar region: Secondary | ICD-10-CM | POA: Diagnosis not present

## 2014-12-22 DIAGNOSIS — Z6831 Body mass index (BMI) 31.0-31.9, adult: Secondary | ICD-10-CM | POA: Diagnosis not present

## 2014-12-22 DIAGNOSIS — M4806 Spinal stenosis, lumbar region: Secondary | ICD-10-CM | POA: Diagnosis not present

## 2015-01-12 DIAGNOSIS — M5136 Other intervertebral disc degeneration, lumbar region: Secondary | ICD-10-CM | POA: Diagnosis not present

## 2015-01-12 DIAGNOSIS — M5416 Radiculopathy, lumbar region: Secondary | ICD-10-CM | POA: Diagnosis not present

## 2015-01-12 DIAGNOSIS — M4306 Spondylolysis, lumbar region: Secondary | ICD-10-CM | POA: Diagnosis not present

## 2015-01-24 DIAGNOSIS — M4317 Spondylolisthesis, lumbosacral region: Secondary | ICD-10-CM | POA: Diagnosis not present

## 2015-01-24 DIAGNOSIS — M5137 Other intervertebral disc degeneration, lumbosacral region: Secondary | ICD-10-CM | POA: Diagnosis not present

## 2015-02-07 DIAGNOSIS — M25572 Pain in left ankle and joints of left foot: Secondary | ICD-10-CM | POA: Diagnosis not present

## 2015-02-21 ENCOUNTER — Other Ambulatory Visit: Payer: Self-pay | Admitting: Internal Medicine

## 2015-03-07 DIAGNOSIS — Z6829 Body mass index (BMI) 29.0-29.9, adult: Secondary | ICD-10-CM | POA: Diagnosis not present

## 2015-03-07 DIAGNOSIS — M5137 Other intervertebral disc degeneration, lumbosacral region: Secondary | ICD-10-CM | POA: Diagnosis not present

## 2015-03-07 DIAGNOSIS — M5126 Other intervertebral disc displacement, lumbar region: Secondary | ICD-10-CM | POA: Diagnosis not present

## 2015-03-10 ENCOUNTER — Other Ambulatory Visit: Payer: Self-pay | Admitting: Internal Medicine

## 2015-03-17 ENCOUNTER — Other Ambulatory Visit: Payer: Self-pay | Admitting: Internal Medicine

## 2015-03-29 ENCOUNTER — Other Ambulatory Visit: Payer: Self-pay

## 2015-04-06 ENCOUNTER — Ambulatory Visit (INDEPENDENT_AMBULATORY_CARE_PROVIDER_SITE_OTHER): Payer: Medicare Other | Admitting: Internal Medicine

## 2015-04-06 ENCOUNTER — Encounter: Payer: Self-pay | Admitting: Internal Medicine

## 2015-04-06 VITALS — BP 122/68 | HR 56 | Temp 98.1°F | Ht 66.0 in | Wt 186.2 lb

## 2015-04-06 DIAGNOSIS — I10 Essential (primary) hypertension: Secondary | ICD-10-CM

## 2015-04-06 DIAGNOSIS — R7309 Other abnormal glucose: Secondary | ICD-10-CM | POA: Diagnosis not present

## 2015-04-06 DIAGNOSIS — M159 Polyosteoarthritis, unspecified: Secondary | ICD-10-CM

## 2015-04-06 DIAGNOSIS — M150 Primary generalized (osteo)arthritis: Secondary | ICD-10-CM | POA: Diagnosis not present

## 2015-04-06 DIAGNOSIS — R7303 Prediabetes: Secondary | ICD-10-CM

## 2015-04-06 LAB — BASIC METABOLIC PANEL
BUN: 25 mg/dL — AB (ref 6–23)
CHLORIDE: 102 meq/L (ref 96–112)
CO2: 36 meq/L — AB (ref 19–32)
Calcium: 10.4 mg/dL (ref 8.4–10.5)
Creatinine, Ser: 1.14 mg/dL (ref 0.40–1.50)
GFR: 66.76 mL/min (ref >60.00)
GLUCOSE: 86 mg/dL (ref 70–99)
Potassium: 5 mEq/L (ref 3.5–5.1)
Sodium: 141 mEq/L (ref 135–145)

## 2015-04-06 NOTE — Progress Notes (Signed)
Pre visit review using our clinic review tool, if applicable. No additional management support is needed unless otherwise documented below in the visit note. 

## 2015-04-06 NOTE — Patient Instructions (Signed)
Get your blood work before you leave   Continue checking your blood pressure regularly  Come back to the office in 6 months   for a routine check up

## 2015-04-06 NOTE — Assessment & Plan Note (Addendum)
Last A1c very good Last  LDL slightly above 100, would prefer to see the LDL better but at this point we agreed on not to pursue treatment

## 2015-04-06 NOTE — Assessment & Plan Note (Signed)
Patient is actually doing well, better compared to last year, more mobile. Does not take any painkiller. Does take gabapentin and occasional Advil

## 2015-04-06 NOTE — Assessment & Plan Note (Signed)
Ambulatory BPs range from 96/47 and 120/60, most numbers are really good. Plan: Continue Coreg, hydralazine, hydrochlorothiazide, Micardis. Check a BMP
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
## 2015-04-07 NOTE — Addendum Note (Signed)
Addended by: Wilfrid Lund on: 04/07/2015 10:58 AM   Modules accepted: Orders

## 2015-04-12 DIAGNOSIS — L570 Actinic keratosis: Secondary | ICD-10-CM | POA: Diagnosis not present

## 2015-04-25 ENCOUNTER — Other Ambulatory Visit: Payer: Self-pay

## 2015-04-25 DIAGNOSIS — L570 Actinic keratosis: Secondary | ICD-10-CM | POA: Diagnosis not present

## 2015-04-25 DIAGNOSIS — L820 Inflamed seborrheic keratosis: Secondary | ICD-10-CM | POA: Diagnosis not present

## 2015-05-02 ENCOUNTER — Ambulatory Visit: Payer: Medicare Other | Admitting: Cardiovascular Disease

## 2015-05-05 ENCOUNTER — Other Ambulatory Visit (INDEPENDENT_AMBULATORY_CARE_PROVIDER_SITE_OTHER): Payer: Medicare Other

## 2015-05-05 DIAGNOSIS — I10 Essential (primary) hypertension: Secondary | ICD-10-CM

## 2015-05-05 LAB — BASIC METABOLIC PANEL
BUN: 16 mg/dL (ref 6–23)
CO2: 32 mEq/L (ref 19–32)
CREATININE: 0.98 mg/dL (ref 0.40–1.50)
Calcium: 9.8 mg/dL (ref 8.4–10.5)
Chloride: 104 mEq/L (ref 96–112)
GFR: 79.47 mL/min (ref >60.00)
GLUCOSE: 110 mg/dL — AB (ref 70–99)
Potassium: 5.1 mEq/L (ref 3.5–5.1)
Sodium: 141 mEq/L (ref 135–145)

## 2015-05-08 ENCOUNTER — Other Ambulatory Visit: Payer: Self-pay | Admitting: Internal Medicine

## 2015-05-16 ENCOUNTER — Ambulatory Visit (INDEPENDENT_AMBULATORY_CARE_PROVIDER_SITE_OTHER): Payer: Medicare Other | Admitting: Cardiovascular Disease

## 2015-05-16 ENCOUNTER — Encounter: Payer: Self-pay | Admitting: Cardiovascular Disease

## 2015-05-16 VITALS — BP 160/68 | HR 53 | Ht 67.0 in | Wt 192.0 lb

## 2015-05-16 DIAGNOSIS — I701 Atherosclerosis of renal artery: Secondary | ICD-10-CM | POA: Diagnosis not present

## 2015-05-16 DIAGNOSIS — I10 Essential (primary) hypertension: Secondary | ICD-10-CM

## 2015-05-16 NOTE — Assessment & Plan Note (Signed)
Blood pressure is under excellent control at home.
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
## 2015-05-16 NOTE — Assessment & Plan Note (Signed)
I suspect that this is likely due to physical deconditioning after prolonged recovery from back surgery. However, he has had any recent stress testing. I suggested a pharmacologic nuclear stress test. However, he feels that his symptoms are gradually improving and he prefers to hold off. His EKG is normal.

## 2015-05-16 NOTE — Patient Instructions (Signed)
Medication Instructions:  Your physician recommends that you continue on your current medications as directed. Please refer to the Current Medication list given to you today.   Labwork: none  Testing/Procedures: none  Follow-Up: Your physician wants you to follow-up in:  12 months.  You will receive a reminder letter in the mail two months in advance. If you don't receive a letter, please call our office to schedule the follow-up appointment.        

## 2015-05-16 NOTE — Assessment & Plan Note (Signed)
The patient has no indication for revascularization. His blood pressure is not refractory and he does not have chronic kidney disease. Continue to monitor. I will have him follow-up with me on a yearly basis.

## 2015-05-24 DIAGNOSIS — H40013 Open angle with borderline findings, low risk, bilateral: Secondary | ICD-10-CM | POA: Diagnosis not present

## 2015-05-24 DIAGNOSIS — H25813 Combined forms of age-related cataract, bilateral: Secondary | ICD-10-CM | POA: Diagnosis not present

## 2015-06-06 ENCOUNTER — Other Ambulatory Visit: Payer: Self-pay

## 2015-06-06 DIAGNOSIS — D485 Neoplasm of uncertain behavior of skin: Secondary | ICD-10-CM | POA: Diagnosis not present

## 2015-06-06 DIAGNOSIS — L720 Epidermal cyst: Secondary | ICD-10-CM | POA: Diagnosis not present

## 2015-06-13 DIAGNOSIS — M5126 Other intervertebral disc displacement, lumbar region: Secondary | ICD-10-CM | POA: Diagnosis not present

## 2015-06-13 DIAGNOSIS — Z6830 Body mass index (BMI) 30.0-30.9, adult: Secondary | ICD-10-CM | POA: Diagnosis not present

## 2015-06-13 DIAGNOSIS — I10 Essential (primary) hypertension: Secondary | ICD-10-CM | POA: Diagnosis not present

## 2015-06-13 DIAGNOSIS — M25552 Pain in left hip: Secondary | ICD-10-CM | POA: Diagnosis not present

## 2015-06-13 DIAGNOSIS — M5137 Other intervertebral disc degeneration, lumbosacral region: Secondary | ICD-10-CM | POA: Diagnosis not present

## 2015-06-21 ENCOUNTER — Encounter: Payer: Self-pay | Admitting: Internal Medicine

## 2015-07-31 DIAGNOSIS — H0015 Chalazion left lower eyelid: Secondary | ICD-10-CM | POA: Diagnosis not present

## 2015-08-03 DIAGNOSIS — H0012 Chalazion right lower eyelid: Secondary | ICD-10-CM | POA: Diagnosis not present

## 2015-08-03 DIAGNOSIS — H01029 Squamous blepharitis unspecified eye, unspecified eyelid: Secondary | ICD-10-CM | POA: Diagnosis not present

## 2015-08-24 DIAGNOSIS — H0012 Chalazion right lower eyelid: Secondary | ICD-10-CM | POA: Diagnosis not present

## 2015-09-05 DIAGNOSIS — M5126 Other intervertebral disc displacement, lumbar region: Secondary | ICD-10-CM | POA: Diagnosis not present

## 2015-09-05 DIAGNOSIS — M5137 Other intervertebral disc degeneration, lumbosacral region: Secondary | ICD-10-CM | POA: Diagnosis not present

## 2015-09-06 ENCOUNTER — Other Ambulatory Visit: Payer: Self-pay | Admitting: Internal Medicine

## 2015-10-06 ENCOUNTER — Ambulatory Visit (HOSPITAL_BASED_OUTPATIENT_CLINIC_OR_DEPARTMENT_OTHER)
Admission: RE | Admit: 2015-10-06 | Discharge: 2015-10-06 | Disposition: A | Discharge status: Home / Self Care | Payer: Medicare Other | Source: Ambulatory Visit | Attending: Internal Medicine | Admitting: Internal Medicine

## 2015-10-06 ENCOUNTER — Ambulatory Visit (INDEPENDENT_AMBULATORY_CARE_PROVIDER_SITE_OTHER): Payer: Medicare Other | Admitting: Internal Medicine

## 2015-10-06 ENCOUNTER — Encounter: Payer: Self-pay | Admitting: Internal Medicine

## 2015-10-06 VITALS — BP 118/64 | HR 53 | Temp 97.5°F | Ht 67.0 in | Wt 195.5 lb

## 2015-10-06 DIAGNOSIS — R739 Hyperglycemia, unspecified: Secondary | ICD-10-CM

## 2015-10-06 DIAGNOSIS — M549 Dorsalgia, unspecified: Secondary | ICD-10-CM

## 2015-10-06 DIAGNOSIS — R7303 Prediabetes: Secondary | ICD-10-CM

## 2015-10-06 DIAGNOSIS — Z09 Encounter for follow-up examination after completed treatment for conditions other than malignant neoplasm: Secondary | ICD-10-CM

## 2015-10-06 DIAGNOSIS — M546 Pain in thoracic spine: Secondary | ICD-10-CM | POA: Insufficient documentation

## 2015-10-06 DIAGNOSIS — R0602 Shortness of breath: Secondary | ICD-10-CM | POA: Diagnosis not present

## 2015-10-06 DIAGNOSIS — I701 Atherosclerosis of renal artery: Secondary | ICD-10-CM | POA: Diagnosis not present

## 2015-10-06 DIAGNOSIS — I10 Essential (primary) hypertension: Secondary | ICD-10-CM | POA: Diagnosis not present

## 2015-10-06 LAB — CBC WITH DIFFERENTIAL/PLATELET
BASOS PCT: 0.6 % (ref 0.0–3.0)
Basophils Absolute: 0 10*3/uL (ref 0.0–0.1)
EOS ABS: 0.4 10*3/uL (ref 0.0–0.7)
Eosinophils Relative: 5 % (ref 0.0–5.0)
HEMATOCRIT: 44.5 % (ref 39.0–52.0)
Hemoglobin: 14.9 g/dL (ref 13.0–17.0)
Lymphocytes Relative: 25.5 % (ref 12.0–46.0)
Lymphs Abs: 1.8 10*3/uL (ref 0.7–4.0)
MCHC: 33.6 g/dL (ref 30.0–36.0)
MCV: 92.3 fl (ref 78.0–100.0)
Monocytes Absolute: 0.6 10*3/uL (ref 0.1–1.0)
Monocytes Relative: 8.9 % (ref 3.0–12.0)
NEUTROS ABS: 4.3 10*3/uL (ref 1.4–7.7)
Neutrophils Relative %: 60 % (ref 43.0–77.0)
PLATELETS: 224 10*3/uL (ref 150.0–400.0)
RBC: 4.82 Mil/uL (ref 4.22–5.81)
RDW: 12.8 % (ref 11.5–15.5)
WBC: 7.2 10*3/uL (ref 4.0–10.5)

## 2015-10-06 LAB — BASIC METABOLIC PANEL
BUN: 23 mg/dL (ref 6–23)
CHLORIDE: 103 meq/L (ref 96–112)
CO2: 29 mEq/L (ref 19–32)
CREATININE: 0.98 mg/dL (ref 0.40–1.50)
Calcium: 9.3 mg/dL (ref 8.4–10.5)
GFR: 79.38 mL/min (ref >60.00)
Glucose, Bld: 99 mg/dL (ref 70–99)
Potassium: 4.1 mEq/L (ref 3.5–5.1)
Sodium: 139 mEq/L (ref 135–145)

## 2015-10-06 LAB — HEMOGLOBIN A1C: Hgb A1c MFr Bld: 6 % (ref 4.6–6.5)

## 2015-10-06 MED ORDER — CARVEDILOL 12.5 MG PO TABS: 12.5000 mg | ORAL_TABLET | Freq: Two times a day (BID) | ORAL | Status: DC

## 2015-10-06 NOTE — Progress Notes (Signed)
Pre visit review using our clinic review tool, if applicable. No additional management support is needed unless otherwise documented below in the visit note. 

## 2015-10-06 NOTE — Patient Instructions (Signed)
Go to the lab.  Go to the first floor for your chest x-ray.   Physical Exam due in January 2017. Please schedule an appointment before you leave.
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
## 2015-10-07 DIAGNOSIS — Z09 Encounter for follow-up examination after completed treatment for conditions other than malignant neoplasm: Secondary | ICD-10-CM | POA: Insufficient documentation

## 2015-10-07 NOTE — Assessment & Plan Note (Signed)
HTN: Check a BMP, continue with present care. Has occasionally low BPs, usually when he exercises. or is active. Recommend to drink plenty of fluids with exertion, skip carvedilol x 1  if the BP is low. Let me know if the problem is more persistent Thoracic pain with radiation anteriorly: Unclear if related to recent cyst excision from the back,will check a chest x-ray to be sure mediastinum is normal. Prediabetes: Check A1c Left radiculopathy: see HPI, We discussed possibly  a prednisone prescription, he declined, states that he will see Dr. Saintclair Halsted soon and discuss this problem. Exposure to listeria: Essentially asymptomatic, if the diarrhea resurface or get worse he will let me know, we'll need a stool culture RTC 12-2015 for a cpx

## 2015-10-10 DIAGNOSIS — M4306 Spondylolysis, lumbar region: Secondary | ICD-10-CM | POA: Diagnosis not present

## 2015-10-10 DIAGNOSIS — M5136 Other intervertebral disc degeneration, lumbar region: Secondary | ICD-10-CM | POA: Diagnosis not present

## 2015-10-10 DIAGNOSIS — M5416 Radiculopathy, lumbar region: Secondary | ICD-10-CM | POA: Diagnosis not present

## 2015-10-10 DIAGNOSIS — M5414 Radiculopathy, thoracic region: Secondary | ICD-10-CM | POA: Diagnosis not present

## 2015-10-16 DIAGNOSIS — M5414 Radiculopathy, thoracic region: Secondary | ICD-10-CM | POA: Diagnosis not present

## 2015-10-16 DIAGNOSIS — R29890 Loss of height: Secondary | ICD-10-CM | POA: Diagnosis not present

## 2015-10-29 ENCOUNTER — Other Ambulatory Visit: Payer: Self-pay | Admitting: Internal Medicine

## 2015-11-01 DIAGNOSIS — M5416 Radiculopathy, lumbar region: Secondary | ICD-10-CM | POA: Diagnosis not present

## 2015-11-01 DIAGNOSIS — M4306 Spondylolysis, lumbar region: Secondary | ICD-10-CM | POA: Diagnosis not present

## 2015-11-01 DIAGNOSIS — M5414 Radiculopathy, thoracic region: Secondary | ICD-10-CM | POA: Diagnosis not present

## 2015-11-01 DIAGNOSIS — M5136 Other intervertebral disc degeneration, lumbar region: Secondary | ICD-10-CM | POA: Diagnosis not present

## 2015-11-03 ENCOUNTER — Other Ambulatory Visit: Payer: Self-pay | Admitting: Internal Medicine

## 2015-11-16 ENCOUNTER — Other Ambulatory Visit: Payer: Self-pay | Admitting: Cardiovascular Disease

## 2015-12-05 DIAGNOSIS — M5023 Other cervical disc displacement, cervicothoracic region: Secondary | ICD-10-CM | POA: Diagnosis not present

## 2015-12-05 DIAGNOSIS — I10 Essential (primary) hypertension: Secondary | ICD-10-CM | POA: Diagnosis not present

## 2015-12-05 DIAGNOSIS — Z6830 Body mass index (BMI) 30.0-30.9, adult: Secondary | ICD-10-CM | POA: Diagnosis not present

## 2015-12-05 DIAGNOSIS — M5137 Other intervertebral disc degeneration, lumbosacral region: Secondary | ICD-10-CM | POA: Diagnosis not present

## 2015-12-06 ENCOUNTER — Other Ambulatory Visit: Payer: Self-pay | Admitting: Internal Medicine

## 2015-12-13 IMAGING — CT CT ABD-PELV W/ CM
1 of 3 series · 14 of 32 positions shown, 19 images · IV contrast (OMNIPAQUE 300)
Comparison: CT of the lumbar spine performed 08/20/2013, and MRI of
the lumbar spine performed 08/11/2013

CLINICAL DATA: Nausea.

EXAM:
CT ABDOMEN AND PELVIS WITH CONTRAST
TECHNIQUE: Multidetector CT imaging of the abdomen and pelvis was performed
using the standard protocol following bolus administration of
intravenous contrast.
CONTRAST:  100 mL of Omnipaque 300 IV contrast

[Series 2: abd/pel with · axial · 0.80mm/px · z∈[-532,-102]mm · 14 of 98 slices shown, 19 images]
[im 6/98  soft-tissue]
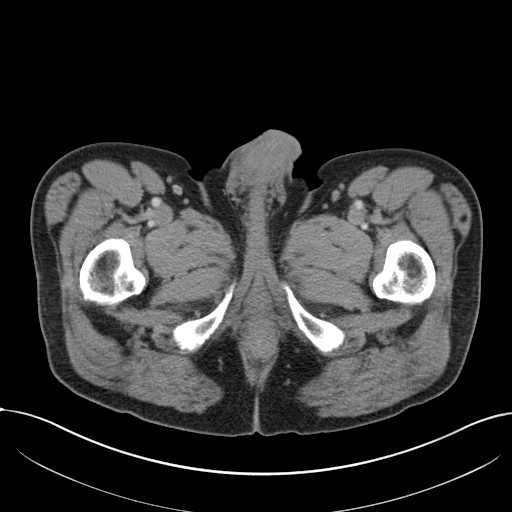
[im 6/98  bone]
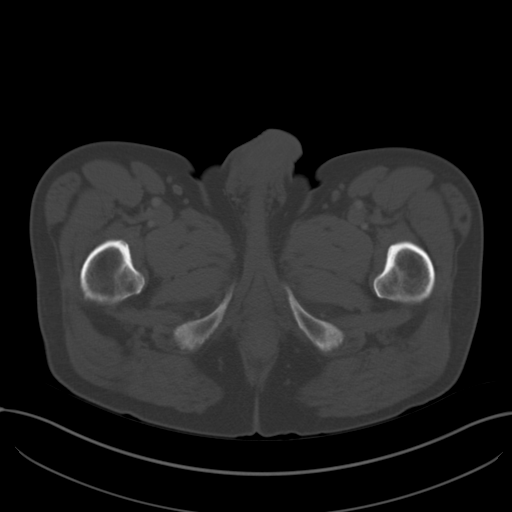
[im 11/98  soft-tissue]
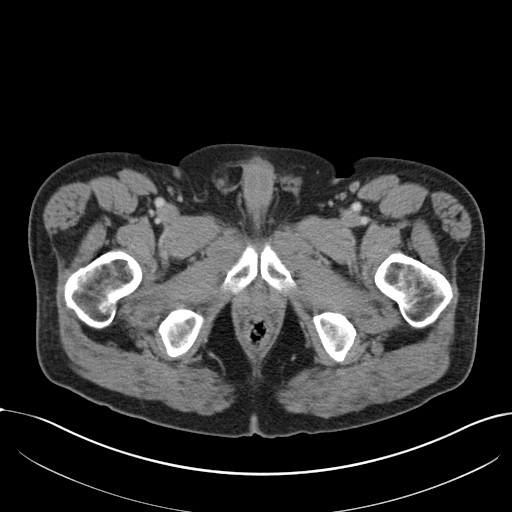
[im 22/98  soft-tissue]
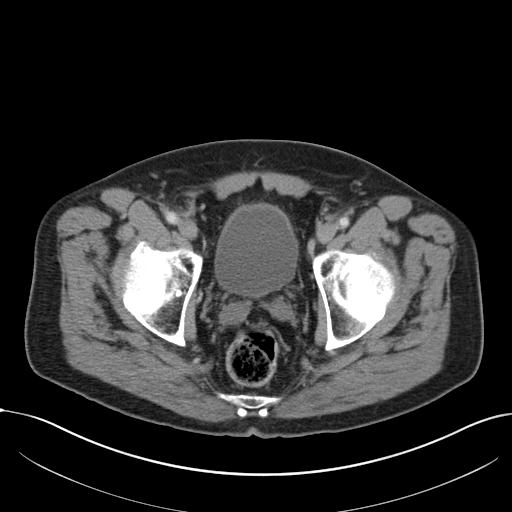
[im 27/98  soft-tissue]
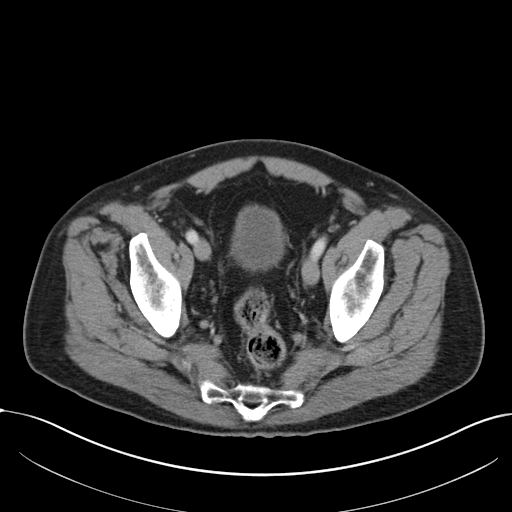
[im 33/98  soft-tissue]
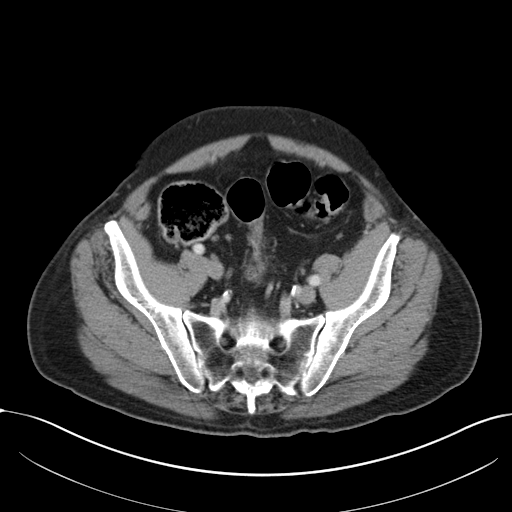
[im 44/98  soft-tissue]
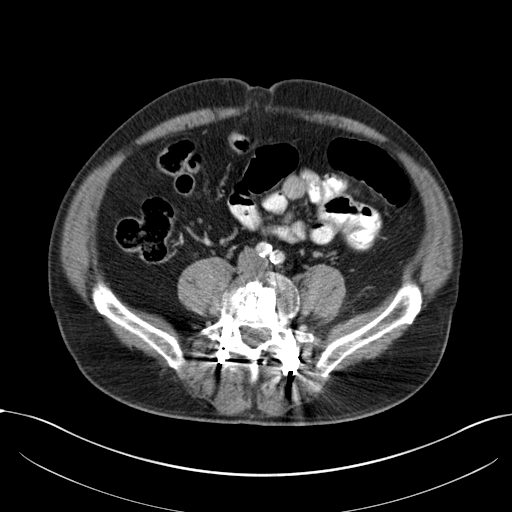
[im 49/98  soft-tissue]
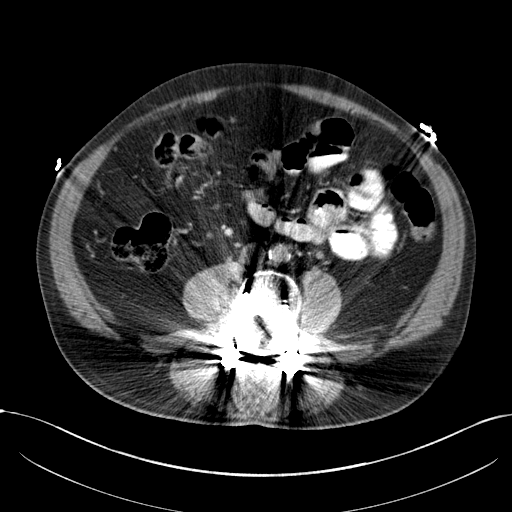
[im 54/98  soft-tissue]
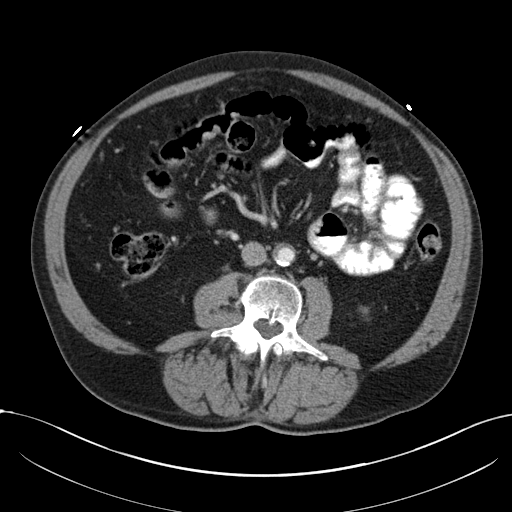
[im 65/98  soft-tissue]
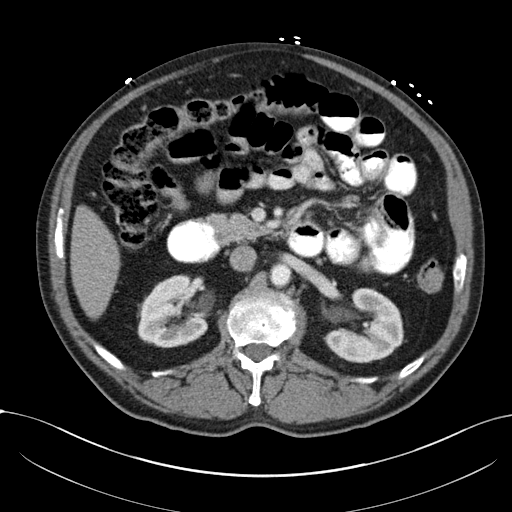
[im 65/98  bone]
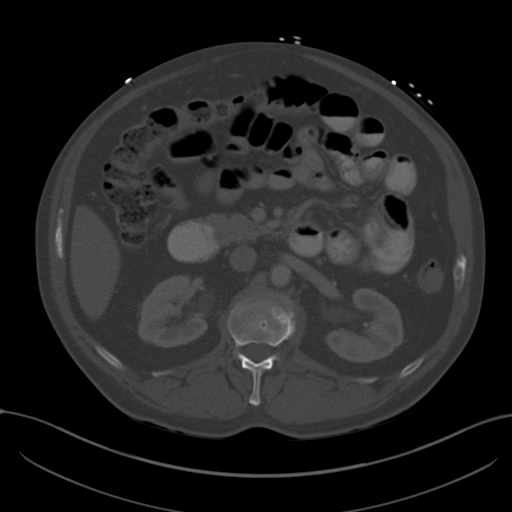
[im 71/98  soft-tissue]
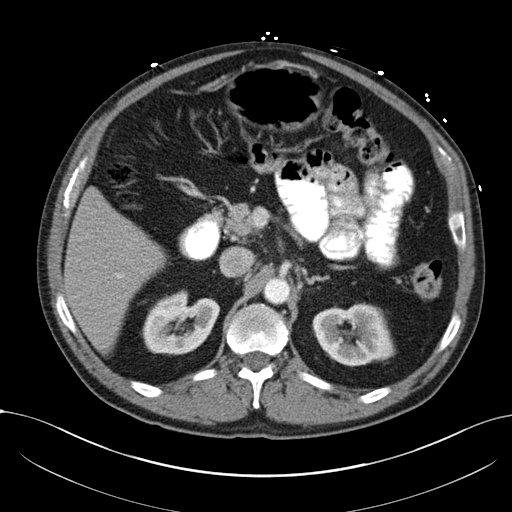
[im 76/98  soft-tissue]
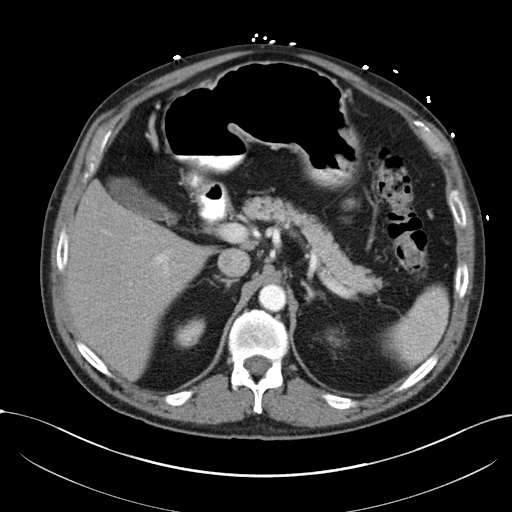
[im 76/98  lung]
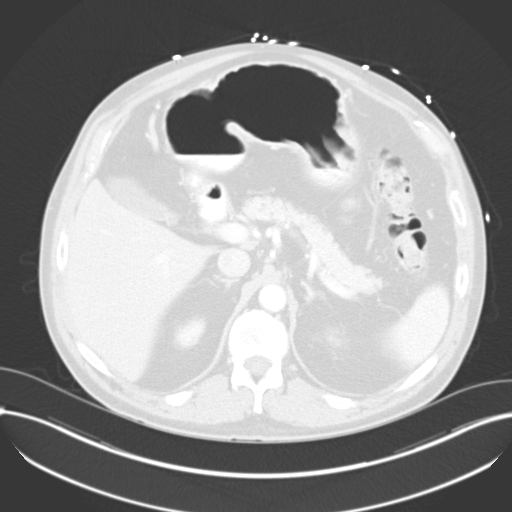
[im 81/98  lung]
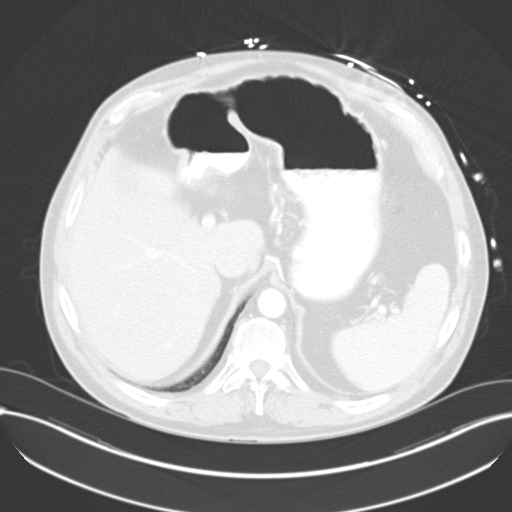
[im 87/98  soft-tissue]
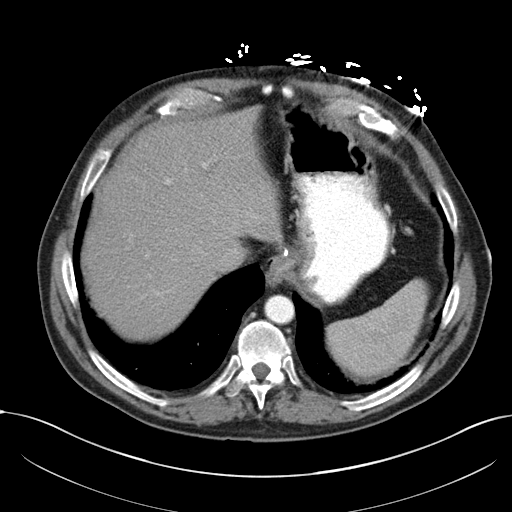
[im 87/98  lung]
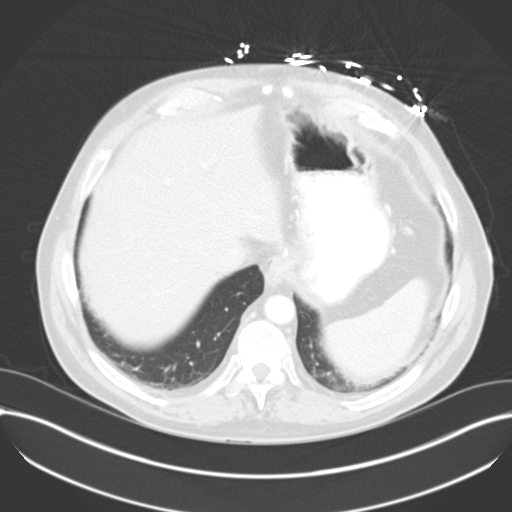
[im 92/98  soft-tissue]
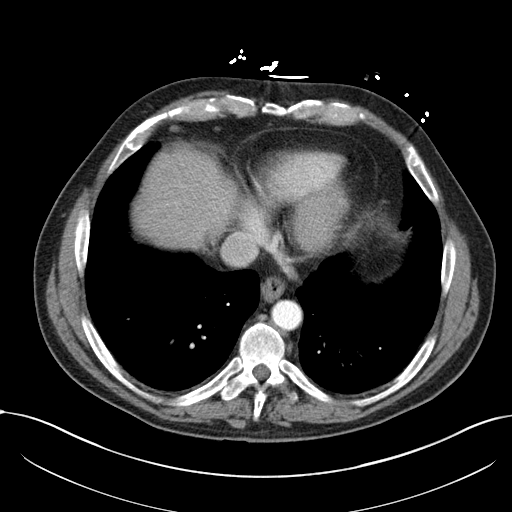
[im 92/98  lung]
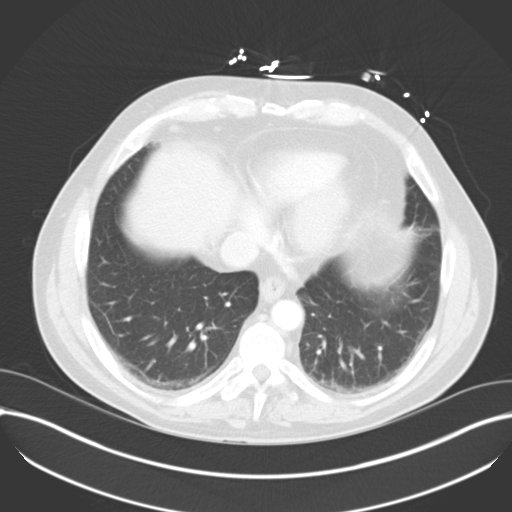

[14 of 32 positions shown; findings below may reference images not displayed]

FINDINGS: Minimal bibasilar atelectasis is noted. A 2.7 cm cyst is noted in
the epicardial fat pad.

The liver and spleen are unremarkable in appearance. The gallbladder
is within normal limits. The pancreas and adrenal glands are
unremarkable.

The kidneys are unremarkable in appearance. There is no evidence of
hydronephrosis. No renal or ureteral stones are seen. Minimal
bilateral perinephric stranding is noted.

No free fluid is identified. The small bowel is unremarkable in
appearance. The stomach is within normal limits. No acute vascular
abnormalities are seen. Scattered calcification is seen along the
abdominal aorta and its branches.

The appendix is normal in caliber and contains air, without evidence
of appendicitis. The colon is unremarkable in appearance.

The bladder is mildly distended and grossly unremarkable in
appearance. The prostate remains normal in size, with scattered
calcification. No inguinal lymphadenopathy is seen.

No acute osseous abnormalities are identified. The patient is status
post posterior lumbar spinal fusion at L4-5, with minimal associated
degenerative change and underlying decompression.
IMPRESSION: 1. No acute abnormality seen within the abdomen or pelvis.
2. Scattered calcification along the abdominal aorta and its
branches.
3. 2.7 cm cyst noted at the epicardial fat pad.

## 2016-01-02 ENCOUNTER — Other Ambulatory Visit: Payer: Self-pay | Admitting: Neurosurgery

## 2016-01-02 DIAGNOSIS — M5126 Other intervertebral disc displacement, lumbar region: Secondary | ICD-10-CM

## 2016-01-02 DIAGNOSIS — M5137 Other intervertebral disc degeneration, lumbosacral region: Secondary | ICD-10-CM | POA: Diagnosis not present

## 2016-01-02 MED FILL — METHYLPREDNISOLONE 4 MG TAB: 4 | 6 days supply | Qty: 21 | Fill #0

## 2016-01-09 ENCOUNTER — Ambulatory Visit
Admission: RE | Admit: 2016-01-09 | Discharge: 2016-01-09 | Disposition: A | Discharge status: Home / Self Care | Payer: Medicare Other | Source: Ambulatory Visit | Attending: Neurosurgery | Admitting: Neurosurgery

## 2016-01-09 DIAGNOSIS — M5126 Other intervertebral disc displacement, lumbar region: Secondary | ICD-10-CM

## 2016-01-11 DIAGNOSIS — Z6830 Body mass index (BMI) 30.0-30.9, adult: Secondary | ICD-10-CM | POA: Diagnosis not present

## 2016-01-11 DIAGNOSIS — M5137 Other intervertebral disc degeneration, lumbosacral region: Secondary | ICD-10-CM | POA: Diagnosis not present

## 2016-01-11 DIAGNOSIS — I10 Essential (primary) hypertension: Secondary | ICD-10-CM | POA: Diagnosis not present

## 2016-01-11 DIAGNOSIS — M5416 Radiculopathy, lumbar region: Secondary | ICD-10-CM | POA: Diagnosis not present

## 2016-01-12 ENCOUNTER — Encounter: Payer: Self-pay | Admitting: Internal Medicine

## 2016-01-12 ENCOUNTER — Ambulatory Visit (INDEPENDENT_AMBULATORY_CARE_PROVIDER_SITE_OTHER): Payer: Medicare Other | Admitting: Internal Medicine

## 2016-01-12 VITALS — BP 156/82 | HR 58 | Temp 97.8°F | Ht 67.0 in | Wt 191.1 lb

## 2016-01-12 DIAGNOSIS — E785 Hyperlipidemia, unspecified: Secondary | ICD-10-CM

## 2016-01-12 DIAGNOSIS — I10 Essential (primary) hypertension: Secondary | ICD-10-CM | POA: Diagnosis not present

## 2016-01-12 DIAGNOSIS — Z0000 Encounter for general adult medical examination without abnormal findings: Secondary | ICD-10-CM

## 2016-01-12 LAB — LIPID PANEL
CHOL/HDL RATIO: 4.5 ratio (ref <=5.0)
Cholesterol: 208 mg/dL — ABNORMAL HIGH (ref 125–200)
HDL: 46 mg/dL (ref >=40)
LDL Cholesterol: 139 mg/dL — ABNORMAL HIGH (ref <130)
Triglycerides: 117 mg/dL (ref <150)
VLDL: 23 mg/dL (ref <30)

## 2016-01-12 LAB — BASIC METABOLIC PANEL
BUN: 20 mg/dL (ref 7–25)
CALCIUM: 9.5 mg/dL (ref 8.6–10.3)
CO2: 29 mmol/L (ref 20–31)
CREATININE: 0.88 mg/dL (ref 0.70–1.18)
Chloride: 102 mmol/L (ref 98–110)
GLUCOSE: 94 mg/dL (ref 65–99)
Potassium: 4.1 mmol/L (ref 3.5–5.3)
SODIUM: 140 mmol/L (ref 135–146)

## 2016-01-12 NOTE — Patient Instructions (Signed)
Get your blood work before you leave   Next  visit in 6 months, please schedule an appointment.  Drink plenty of fluids, Skip one dose of carvedilol if your blood pressure is less than 100/60    Fall Prevention and Home Safety Falls cause injuries and can affect all age groups. It is possible to use preventive measures to significantly decrease the likelihood of falls. There are many simple measures which can make your home safer and prevent falls. OUTDOORS  Repair cracks and edges of walkways and driveways.  Remove high doorway thresholds.  Trim shrubbery on the main path into your home.  Have good outside lighting.  Clear walkways of tools, rocks, debris, and clutter.  Check that handrails are not broken and are securely fastened. Both sides of steps should have handrails.  Have leaves, snow, and ice cleared regularly.  Use sand or salt on walkways during winter months.  In the garage, clean up grease or oil spills. BATHROOM  Install night lights.  Install grab bars by the toilet and in the tub and shower.  Use non-skid mats or decals in the tub or shower.  Place a plastic non-slip stool in the shower to sit on, if needed.  Keep floors dry and clean up all water on the floor immediately.  Remove soap buildup in the tub or shower on a regular basis.  Secure bath mats with non-slip, double-sided rug tape.  Remove throw rugs and tripping hazards from the floors. BEDROOMS  Install night lights.  Make sure a bedside light is easy to reach.  Do not use oversized bedding.  Keep a telephone by your bedside.  Have a firm chair with side arms to use for getting dressed.  Remove throw rugs and tripping hazards from the floor. KITCHEN  Keep handles on pots and pans turned toward the center of the stove. Use back burners when possible.  Clean up spills quickly and allow time for drying.  Avoid walking on wet floors.  Avoid hot utensils and knives.  Position  shelves so they are not too high or low.  Place commonly used objects within easy reach.  If necessary, use a sturdy step stool with a grab bar when reaching.  Keep electrical cables out of the way.  Do not use floor polish or wax that makes floors slippery. If you must use wax, use non-skid floor wax.  Remove throw rugs and tripping hazards from the floor. STAIRWAYS  Never leave objects on stairs.  Place handrails on both sides of stairways and use them. Fix any loose handrails. Make sure handrails on both sides of the stairways are as long as the stairs.  Check carpeting to make sure it is firmly attached along stairs. Make repairs to worn or loose carpet promptly.  Avoid placing throw rugs at the top or bottom of stairways, or properly secure the rug with carpet tape to prevent slippage. Get rid of throw rugs, if possible.  Have an electrician put in a light switch at the top and bottom of the stairs. OTHER FALL PREVENTION TIPS  Wear low-heel or rubber-soled shoes that are supportive and fit well. Wear closed toe shoes.  When using a stepladder, make sure it is fully opened and both spreaders are firmly locked. Do not climb a closed stepladder.  Add color or contrast paint or tape to grab bars and handrails in your home. Place contrasting color strips on first and last steps.  Learn and use mobility aids as  needed. Install an electrical emergency response system.  Turn on lights to avoid dark areas. Replace light bulbs that burn out immediately. Get light switches that glow.  Arrange furniture to create clear pathways. Keep furniture in the same place.  Firmly attach carpet with non-skid or double-sided tape.  Eliminate uneven floor surfaces.  Select a carpet pattern that does not visually hide the edge of steps.  Be aware of all pets. OTHER HOME SAFETY TIPS  Set the water temperature for 120 F (48.8 C).  Keep emergency numbers on or near the telephone.  Keep  smoke detectors on every level of the home and near sleeping areas. Document Released: 11/29/2002 Document Revised: 06/09/2012 Document Reviewed: 02/28/2012 [facility] Patient Information 2015 [place], [place]. This information is not intended to replace advice given to you by your health care provider. Make sure you discuss any questions you have with your health care provider.   Preventive Care for Adults Ages 6 and over  Blood pressure check.** / Every 1 to 2 years.  Lipid and cholesterol check.**/ Every 5 years beginning at age 38.  Lung cancer screening. / Every year if you are aged 50-80 years and have a 30-pack-year history of smoking and currently smoke or have quit within the past 15 years. Yearly screening is stopped once you have quit smoking for at least 15 years or develop a health problem that would prevent you from having lung cancer treatment.  Fecal occult blood test (FOBT) of stool. / Every year beginning at age 11 and continuing until age 63. You may not have to do this test if you get a colonoscopy every 10 years.  Flexible sigmoidoscopy** or colonoscopy.** / Every 5 years for a flexible sigmoidoscopy or every 10 years for a colonoscopy beginning at age 21 and continuing until age 43.  Hepatitis C blood test.** / For all people born from [birth year] through 1965 and any individual with known risks for hepatitis C.  Abdominal aortic aneurysm (AAA) screening.** / A one-time screening for ages 23 to 75 years who are current or former smokers.  Skin self-exam. / Monthly.  Influenza vaccine. / Every year.  Tetanus, diphtheria, and acellular pertussis (Tdap/Td) vaccine.** / 1 dose of Td every 10 years.  Varicella vaccine.** / Consult your health care provider.  Zoster vaccine.** / 1 dose for adults aged 65 years or older.  Pneumococcal 13-valent conjugate (PCV13) vaccine.** / Consult your health care provider.  Pneumococcal polysaccharide (PPSV23) vaccine.** / 1 dose for all  adults aged 50 years and older.  Meningococcal vaccine.** / Consult your health care provider.  Hepatitis A vaccine.** / Consult your health care provider.  Hepatitis B vaccine.** / Consult your health care provider.  Haemophilus influenzae type b (Hib) vaccine.** / Consult your health care provider. **Family history and personal history of risk and conditions may change your health care provider's recommendations. Document Released: 02/04/2002 Document Revised: 12/14/2013 Document Reviewed: 05/06/2011 [facility] Patient Information 2015 [place], [place]. This information is not intended to replace advice given to you by your health care provider. Make sure you discuss any questions you have with your health care provider.
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
## 2016-01-12 NOTE — Assessment & Plan Note (Addendum)
Tdap 2012; pneumonia shot 03-2009; prevnar--2015;shingles shot -- 2008, had a flu shot   Cscope several Cscopes due to polyps, Cscope  12-07, TICS , no polyp; Cscope 09-2011: 3 polyps, 5 years  Prostate ca screening, +FH uncle Last DRE 2013 normal, last PSA normal 2015, pro-cons of further screening discussed, declined at this time

## 2016-01-12 NOTE — Progress Notes (Signed)
Pre visit review using our clinic review tool, if applicable. No additional management support is needed unless otherwise documented below in the visit note. 

## 2016-01-17 DIAGNOSIS — M4306 Spondylolysis, lumbar region: Secondary | ICD-10-CM | POA: Diagnosis not present

## 2016-01-17 DIAGNOSIS — M5416 Radiculopathy, lumbar region: Secondary | ICD-10-CM | POA: Diagnosis not present

## 2016-01-17 DIAGNOSIS — M5136 Other intervertebral disc degeneration, lumbar region: Secondary | ICD-10-CM | POA: Diagnosis not present

## 2016-02-13 DIAGNOSIS — M5126 Other intervertebral disc displacement, lumbar region: Secondary | ICD-10-CM | POA: Diagnosis not present

## 2016-02-13 DIAGNOSIS — M5137 Other intervertebral disc degeneration, lumbosacral region: Secondary | ICD-10-CM | POA: Diagnosis not present

## 2016-02-22 ENCOUNTER — Encounter: Payer: Self-pay | Admitting: Internal Medicine

## 2016-02-23 ENCOUNTER — Telehealth: Payer: Self-pay | Admitting: Internal Medicine

## 2016-02-23 ENCOUNTER — Telehealth: Payer: Self-pay | Admitting: Family Medicine

## 2016-02-23 ENCOUNTER — Ambulatory Visit (INDEPENDENT_AMBULATORY_CARE_PROVIDER_SITE_OTHER): Payer: Medicare Other | Admitting: Medical

## 2016-02-23 ENCOUNTER — Ambulatory Visit (HOSPITAL_BASED_OUTPATIENT_CLINIC_OR_DEPARTMENT_OTHER)
Admission: RE | Admit: 2016-02-23 | Discharge: 2016-02-23 | Disposition: A | Discharge status: Home / Self Care | Payer: Medicare Other | Source: Ambulatory Visit | Attending: Medical | Admitting: Medical

## 2016-02-23 ENCOUNTER — Ambulatory Visit: Payer: Self-pay | Admitting: Internal Medicine

## 2016-02-23 ENCOUNTER — Encounter: Payer: Self-pay | Admitting: Medical

## 2016-02-23 ENCOUNTER — Telehealth: Payer: Self-pay | Admitting: *Deleted

## 2016-02-23 VITALS — BP 100/70 | HR 89 | Temp 97.3°F | Ht 67.0 in | Wt 183.0 lb

## 2016-02-23 DIAGNOSIS — R062 Wheezing: Secondary | ICD-10-CM | POA: Insufficient documentation

## 2016-02-23 DIAGNOSIS — J209 Acute bronchitis, unspecified: Secondary | ICD-10-CM

## 2016-02-23 DIAGNOSIS — J111 Influenza due to unidentified influenza virus with other respiratory manifestations: Secondary | ICD-10-CM

## 2016-02-23 DIAGNOSIS — R918 Other nonspecific abnormal finding of lung field: Secondary | ICD-10-CM | POA: Insufficient documentation

## 2016-02-23 DIAGNOSIS — R05 Cough: Secondary | ICD-10-CM

## 2016-02-23 DIAGNOSIS — R509 Fever, unspecified: Secondary | ICD-10-CM | POA: Diagnosis not present

## 2016-02-23 DIAGNOSIS — R059 Cough, unspecified: Secondary | ICD-10-CM

## 2016-02-23 LAB — CBC WITH DIFFERENTIAL/PLATELET
BASOS ABS: 0 10*3/uL (ref 0.0–0.1)
Basophils Relative: 0 % (ref 0–1)
EOS ABS: 0 10*3/uL (ref 0.0–0.7)
Eosinophils Relative: 0 % (ref 0–5)
HCT: 39.8 % (ref 39.0–52.0)
Hemoglobin: 13.9 g/dL (ref 13.0–17.0)
LYMPHS ABS: 1.7 10*3/uL (ref 0.7–4.0)
Lymphocytes Relative: 10 % — ABNORMAL LOW (ref 12–46)
MCH: 31.4 pg (ref 26.0–34.0)
MCHC: 34.9 g/dL (ref 30.0–36.0)
MCV: 89.8 fL (ref 78.0–100.0)
MPV: 10.4 fL (ref 8.6–12.4)
Monocytes Absolute: 1.2 10*3/uL — ABNORMAL HIGH (ref 0.1–1.0)
Monocytes Relative: 7 % (ref 3–12)
NEUTROS ABS: 13.7 10*3/uL — AB (ref 1.7–7.7)
NEUTROS PCT: 83 % — AB (ref 43–77)
PLATELETS: 372 10*3/uL (ref 150–400)
RBC: 4.43 MIL/uL (ref 4.22–5.81)
RDW: 12.9 % (ref 11.5–15.5)
WBC: 16.5 10*3/uL — ABNORMAL HIGH (ref 4.0–10.5)

## 2016-02-23 LAB — POCT INFLUENZA A/B
INFLUENZA A, POC: NEGATIVE
Influenza B, POC: NEGATIVE

## 2016-02-23 MED ORDER — ALBUTEROL SULFATE HFA 108 (90 BASE) MCG/ACT IN AERS: 2.0000 | INHALATION_SPRAY | Freq: Four times a day (QID) | RESPIRATORY_TRACT | Status: DC | PRN

## 2016-02-23 MED ORDER — PREDNISONE 10 MG PO TABS: ORAL_TABLET | ORAL | Status: DC

## 2016-02-23 MED ORDER — IPRATROPIUM-ALBUTEROL 0.5-2.5 (3) MG/3ML IN SOLN
3.0000 mL | Freq: Once | RESPIRATORY_TRACT | Status: AC
  Administered 2016-02-23: 3 mL via RESPIRATORY_TRACT

## 2016-02-23 MED ORDER — BENZONATATE 100 MG PO CAPS: 100.0000 mg | ORAL_CAPSULE | Freq: Three times a day (TID) | ORAL | Status: DC | PRN

## 2016-02-23 MED ORDER — AZITHROMYCIN 250 MG PO TABS: ORAL_TABLET | ORAL | Status: DC

## 2016-02-23 MED ORDER — FLUTICASONE PROPIONATE 50 MCG/ACT NA SUSP: 2.0000 | Freq: Every day | NASAL | Status: DC

## 2016-02-23 MED ORDER — BECLOMETHASONE DIPROPIONATE 40 MCG/ACT IN AERS: INHALATION_SPRAY | RESPIRATORY_TRACT | Status: DC

## 2016-02-23 MED ORDER — OSELTAMIVIR PHOSPHATE 75 MG PO CAPS: 75.0000 mg | ORAL_CAPSULE | Freq: Two times a day (BID) | ORAL | Status: DC

## 2016-02-23 MED FILL — BENZONATATE 100 MG CAPSULE: 100 | 7 days supply | Qty: 21 | Fill #0

## 2016-02-23 MED FILL — PROAIR HFA 90 MCG INHALER: 108 (90 BAS | 25 days supply | Qty: 9 | Fill #0

## 2016-02-23 MED FILL — predniSONE 10 MG TABS: 10 | 5 days supply | Qty: 15 | Fill #0

## 2016-02-23 MED FILL — AZITHROMYCIN 250 MG TABLET: 250 | 5 days supply | Qty: 6 | Fill #0

## 2016-02-23 MED FILL — OSELTAMIVIR PHOS 75 MG CAP: 75 | 5 days supply | Qty: 10 | Fill #0

## 2016-02-23 MED FILL — FLOVENT HFA 110 MCG INHALER: 110 | 30 days supply | Qty: 12 | Fill #0

## 2016-02-23 MED FILL — FLUTICASONE PROP 50 MCG SPR: 50 | 30 days supply | Qty: 16 | Fill #0
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
## 2016-02-23 NOTE — Telephone Encounter (Signed)
Called pt on home and mobile. LMOM on home (preferred) for pt to return call. See phone note.

## 2016-02-23 NOTE — Progress Notes (Signed)
Pre visit review using our clinic review tool, if applicable. No additional management support is needed unless otherwise documented below in the visit note. 

## 2016-02-23 NOTE — Telephone Encounter (Signed)
Please call pt back and transfer to [facility] per previous note. Pt needs to be triaged ASAP.

## 2016-02-23 NOTE — Telephone Encounter (Signed)
Patient Name: [name]  DOB: 1941-01-07    Initial Comment Caller states has fever, headaches, join pain, feels weak and disoriented, dehydration, drank some pedialyte, coughed up large amount of sputum, bp running low, double vision at times, insomnia, this started Sat, but has gotten worse, no fever, he is cold as ice. Having hard time swallowing   Nurse Assessment  Nurse: Mallie Mussel, RN, Alveta Heimlich Date/Time Eilene Ghazi Time): 02/23/2016 9:59:06 AM  Confirm and document reason for call. If symptomatic, describe symptoms. You must click the next button to save text entered. ---Caller states that her husband has some wheezing since Saturday. It cannot be heard across the room. Denies fever at present, but he has had one. He does have trouble swallowing, but denies sore throat. He has dysphagia. He has a productive cough with greenish color. He has a headache which began Saturday. He rates his pain as 7-8 on 0-10 scale. He has double vision at present.  Has the patient traveled out of the country within the last 30 days? ---No  Does the patient have any new or worsening symptoms? ---Yes  Will a triage be completed? ---Yes  Related visit to physician within the last 2 weeks? ---No  Does the PT have any chronic conditions? (i.e. diabetes, asthma, etc.) ---Yes  List chronic conditions. ---HTN, Dysphagia,  Is this a behavioral health or substance abuse call? ---No     Guidelines    Guideline Title Affirmed Question Affirmed Notes  Headache [1] MODERATE headache (e.g., interferes with normal activities) AND [2] present > 24 hours AND [3] unexplained (Exceptions: analgesics not tried, typical migraine, or headache part of viral illness)    Final Disposition User   See Physician within [place], RN, Alveta Heimlich    Comments  He has a history of double vision since he was a child.  I scheduled an appointment for 2:45pm today with Dr. Larose Kells.   Referrals  REFERRED TO PCP OFFICE   Disagree/Comply: Comply

## 2016-02-23 NOTE — Patient Instructions (Addendum)
You appear to have bronchitis following flu syndrome. Rest hydrate and tylenol for fever. I am prescribing cough medicinebenzonatate, and azithromycin  antibiotic. For your nasal congestion   flonase  Rx.  For flu like presentation at onset. Tamiflu  Get cbc stat and cxr stat.  Duoneb given in office. Rx albuterol inhaler, qvar inhaler and taperd prednisone.  Follow up in Monday or as needed  If worsens over weekend then ED evaluation.

## 2016-02-23 NOTE — Telephone Encounter (Signed)
Called pt on home and mobile numbers for triage re: MyChart message, no answer on either number. LM on home # (preferred) for pt to call office to assess symptoms. If pt calls back, can transfer to [facility] or Clinic RN.

## 2016-02-23 NOTE — Telephone Encounter (Signed)
Call received from  [facility] on call. Unable to reach pt by phone to let them know results.  Wbc stat return 16. Full cbc, cxr  And OV note reviewed on epic.  Pt started on azithromycin for pneumonia on 3/3.Marland Kitchen Wbc reflects ongoing bacterial pneumonia as seen on CXR.  No further treatment needed. PCP office to contact pt on Monday to let them know lab results.  CC: Dr. Caro Laroche PCP and Johnette Abraham Saguier PA

## 2016-02-23 NOTE — Telephone Encounter (Signed)
Would you call pt on Monday see how he is. Also he needs appointment on Monday. Has pneumonia and high white count. I had advised ED evaluation and he declined last we talked.

## 2016-02-23 NOTE — Telephone Encounter (Signed)
Appt noted

## 2016-02-23 NOTE — Telephone Encounter (Signed)
Pt returned call. Nurse(s) unavailable. Please call back at : [phone]

## 2016-02-23 NOTE — Telephone Encounter (Signed)
Called pt.  Transferred to [facility].

## 2016-02-23 NOTE — Telephone Encounter (Signed)
I notifed pt of his xray results and his wbc. Explained my thought of flu first followed by pneumonia and now wheezing as complication. I explained most conservative and recommended advise is to  be evaluated in ED  now. He may need to be admitted. Pt was hesitant to be evaluated tonight. He did not want to go. He had not taken any of the medication that I wrote. I advised him to go ahead and take first doses. See if he has some improvement by tomorrow morning. If no improvement then go to ED in the am. If worse tonight then ED evaluation. Educated pt that pneumonia could even cause death. If he does not go to ED this weekend then I want to see him in office on Monday. I asked him to call on Monday for appointment. I will also get my MA to check/schedule appointment and call pt see how he is.

## 2016-02-26 ENCOUNTER — Ambulatory Visit (INDEPENDENT_AMBULATORY_CARE_PROVIDER_SITE_OTHER): Payer: Medicare Other | Admitting: Medical

## 2016-02-26 ENCOUNTER — Encounter: Payer: Self-pay | Admitting: Medical

## 2016-02-26 VITALS — BP 118/78 | HR 78 | Temp 97.4°F | Ht 67.0 in | Wt 183.6 lb

## 2016-02-26 DIAGNOSIS — D72829 Elevated white blood cell count, unspecified: Secondary | ICD-10-CM | POA: Diagnosis not present

## 2016-02-26 DIAGNOSIS — R062 Wheezing: Secondary | ICD-10-CM

## 2016-02-26 DIAGNOSIS — J189 Pneumonia, unspecified organism: Secondary | ICD-10-CM

## 2016-02-26 DIAGNOSIS — R05 Cough: Secondary | ICD-10-CM

## 2016-02-26 DIAGNOSIS — R059 Cough, unspecified: Secondary | ICD-10-CM

## 2016-02-26 NOTE — Patient Instructions (Addendum)
You are much better now. Continue the tamiflu, taper prednisone and the zpack.  If as you taper off prednisone you worsen  may consider extending prednisone. If fever, chills or sweat return may give levofloxin after you finish zpack.  Get cxr  And  Cbc this coming Monday. Follow up as needed
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
## 2016-02-26 NOTE — Progress Notes (Signed)
Pre visit review using our clinic review tool, if applicable. No additional management support is needed unless otherwise documented below in the visit note. 

## 2016-02-26 NOTE — Telephone Encounter (Signed)
Pt has a follow up this afternoon.

## 2016-02-29 ENCOUNTER — Telehealth: Payer: Self-pay | Admitting: Internal Medicine

## 2016-02-29 NOTE — Telephone Encounter (Signed)
Received a BP low from the patient, BPs on March 1-2-3 low, from 74/42-96/51. Was seen here 02/26/2016 and BP was okay. Advise patient, BPs are running low at home, decrease hydralazine from one tablet 3 times a day to one tablet twice a day, continue monitoring.

## 2016-03-01 NOTE — Telephone Encounter (Signed)
MyChart message sent to Pt, informed him to let us know of BP readings in 2 weeks. Med list updated.

## 2016-03-04 ENCOUNTER — Ambulatory Visit (HOSPITAL_BASED_OUTPATIENT_CLINIC_OR_DEPARTMENT_OTHER)
Admission: RE | Admit: 2016-03-04 | Discharge: 2016-03-04 | Disposition: A | Discharge status: Home / Self Care | Payer: Medicare Other | Source: Ambulatory Visit | Attending: Medical | Admitting: Medical

## 2016-03-04 ENCOUNTER — Ambulatory Visit (INDEPENDENT_AMBULATORY_CARE_PROVIDER_SITE_OTHER): Payer: Medicare Other | Admitting: Internal Medicine

## 2016-03-04 ENCOUNTER — Encounter: Payer: Self-pay | Admitting: Internal Medicine

## 2016-03-04 VITALS — BP 108/64 | HR 65 | Temp 98.3°F | Ht 67.0 in | Wt 183.0 lb

## 2016-03-04 DIAGNOSIS — J189 Pneumonia, unspecified organism: Secondary | ICD-10-CM | POA: Insufficient documentation

## 2016-03-04 DIAGNOSIS — Z09 Encounter for follow-up examination after completed treatment for conditions other than malignant neoplasm: Secondary | ICD-10-CM | POA: Diagnosis not present

## 2016-03-04 DIAGNOSIS — I10 Essential (primary) hypertension: Secondary | ICD-10-CM

## 2016-03-04 DIAGNOSIS — R059 Cough, unspecified: Secondary | ICD-10-CM

## 2016-03-04 DIAGNOSIS — J181 Lobar pneumonia, unspecified organism: Secondary | ICD-10-CM | POA: Diagnosis not present

## 2016-03-04 DIAGNOSIS — R05 Cough: Secondary | ICD-10-CM

## 2016-03-04 DIAGNOSIS — M545 Low back pain: Secondary | ICD-10-CM

## 2016-03-04 MED ORDER — TRAMADOL HCL 50 MG PO TABS: 50.0000 mg | ORAL_TABLET | Freq: Three times a day (TID) | ORAL | Status: DC | PRN

## 2016-03-04 MED ORDER — CARVEDILOL 6.25 MG PO TABS: 6.2500 mg | ORAL_TABLET | Freq: Two times a day (BID) | ORAL | Status: DC

## 2016-03-04 MED FILL — traMADol HCL 50 MG TABS: 50 | 20 days supply | Qty: 60 | Fill #0

## 2016-03-04 NOTE — Patient Instructions (Addendum)
  GO TO THE FRONT DESK Schedule your next appointment for a checkup   When? Month Fasting? No     Stop hydralazine  Change carvedilol to the lower dose 6.25 twice a day   Check the  blood pressure daily Be sure your blood pressure is between 110/65 and  145/85. If it is consistently higher or lower, let me know

## 2016-03-04 NOTE — Progress Notes (Signed)
Pre visit review using our clinic review tool, if applicable. No additional management support is needed unless otherwise documented below in the visit note. 

## 2016-03-04 NOTE — Assessment & Plan Note (Signed)
RUL pneumonia: Status post a Z-Pak, overall improving, chest x-ray today show decrease but not resolution of infiltrate. Recommend to RTC in one month, will need a follow-up chest x-ray HTN: Many of his ambulatory BPs are low.  He is taking hydralazine 1 tablet daily >>> d/c it . Decrease carvedilol to 6.25 twice a day, continue HCTZ, micardis. Call if amb BP continue to be low Back pain: Waiting for a epidural this week, needs meds for when necessary use, prescribed tramadol  RTC 1 month
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
## 2016-03-06 DIAGNOSIS — M4306 Spondylolysis, lumbar region: Secondary | ICD-10-CM | POA: Diagnosis not present

## 2016-03-06 DIAGNOSIS — M5416 Radiculopathy, lumbar region: Secondary | ICD-10-CM | POA: Diagnosis not present

## 2016-03-06 DIAGNOSIS — M5136 Other intervertebral disc degeneration, lumbar region: Secondary | ICD-10-CM | POA: Diagnosis not present

## 2016-03-14 DIAGNOSIS — M5137 Other intervertebral disc degeneration, lumbosacral region: Secondary | ICD-10-CM | POA: Diagnosis not present

## 2016-03-15 ENCOUNTER — Ambulatory Visit (INDEPENDENT_AMBULATORY_CARE_PROVIDER_SITE_OTHER): Payer: Medicare Other | Admitting: Internal Medicine

## 2016-03-15 ENCOUNTER — Encounter: Payer: Self-pay | Admitting: Internal Medicine

## 2016-03-15 ENCOUNTER — Ambulatory Visit (HOSPITAL_BASED_OUTPATIENT_CLINIC_OR_DEPARTMENT_OTHER)
Admission: RE | Admit: 2016-03-15 | Discharge: 2016-03-15 | Disposition: A | Discharge status: Home / Self Care | Payer: Medicare Other | Source: Ambulatory Visit | Attending: Internal Medicine | Admitting: Internal Medicine

## 2016-03-15 VITALS — BP 122/76 | HR 76 | Temp 97.6°F | Ht 67.0 in | Wt 181.2 lb

## 2016-03-15 DIAGNOSIS — J189 Pneumonia, unspecified organism: Secondary | ICD-10-CM | POA: Insufficient documentation

## 2016-03-15 DIAGNOSIS — I10 Essential (primary) hypertension: Secondary | ICD-10-CM
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
## 2016-03-15 NOTE — Assessment & Plan Note (Signed)
Pneumonia: Better, check a chest x-ray HTN: Last visit medications were adjusted d/t low BPs. BP has definitely improved, only 1 low reading in the last 8 days. No change RTC June for a checkup

## 2016-03-15 NOTE — Progress Notes (Signed)
Pre visit review using our clinic review tool, if applicable. No additional management support is needed unless otherwise documented below in the visit note. 

## 2016-03-15 NOTE — Patient Instructions (Signed)
Get your chest x-ray  Next visit by June for a routine checkup.

## 2016-04-04 ENCOUNTER — Ambulatory Visit: Payer: Medicare Other | Admitting: Internal Medicine

## 2016-04-25 ENCOUNTER — Other Ambulatory Visit: Payer: Self-pay | Admitting: Internal Medicine

## 2016-04-25 NOTE — Telephone Encounter (Signed)
Last refill #270 with 1 refill on 10/30/2015 Last Office visit 03/15/2016

## 2016-04-29 NOTE — Telephone Encounter (Signed)
Rx sent 

## 2016-04-29 NOTE — Telephone Encounter (Signed)
Okay gabapentin #270 and 2 refill

## 2016-05-21 ENCOUNTER — Encounter: Payer: Self-pay | Admitting: Cardiovascular Disease

## 2016-05-21 ENCOUNTER — Ambulatory Visit (INDEPENDENT_AMBULATORY_CARE_PROVIDER_SITE_OTHER): Payer: Medicare Other | Admitting: Cardiovascular Disease

## 2016-05-21 VITALS — BP 134/72 | HR 67 | Ht 67.0 in | Wt 184.0 lb

## 2016-05-21 DIAGNOSIS — M79605 Pain in left leg: Secondary | ICD-10-CM

## 2016-05-21 NOTE — Patient Instructions (Signed)
Medication Instructions:  Your physician recommends that you continue on your current medications as directed. Please refer to the Current Medication list given to you today.  Labwork: No new orders.   Testing/Procedures: Your physician has requested that you have a lower extremity arterial doppler. This test is an ultrasound of the arteries in the legs. It looks at arterial blood flow in the legs. Allow one hour for Lower Arterial scans. There are no restrictions or special instructions  Follow-Up: Your physician wants you to follow-up in: 1 YEAR with Dr Fletcher Anon.  You will receive a reminder letter in the mail two months in advance. If you don't receive a letter, please call our office to schedule the follow-up appointment.   Any Other Special Instructions Will Be Listed Below (If Applicable).     If you need a refill on your cardiac medications before your next appointment, please call your pharmacy.
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
## 2016-05-23 DIAGNOSIS — H25813 Combined forms of age-related cataract, bilateral: Secondary | ICD-10-CM | POA: Diagnosis not present

## 2016-05-23 DIAGNOSIS — H0012 Chalazion right lower eyelid: Secondary | ICD-10-CM | POA: Diagnosis not present

## 2016-05-23 DIAGNOSIS — H40013 Open angle with borderline findings, low risk, bilateral: Secondary | ICD-10-CM | POA: Diagnosis not present

## 2016-05-27 ENCOUNTER — Other Ambulatory Visit: Payer: Self-pay | Admitting: Cardiovascular Disease

## 2016-05-27 DIAGNOSIS — I739 Peripheral vascular disease, unspecified: Secondary | ICD-10-CM

## 2016-05-29 ENCOUNTER — Ambulatory Visit (HOSPITAL_COMMUNITY)
Admission: RE | Admit: 2016-05-29 | Discharge: 2016-05-29 | Disposition: A | Discharge status: Home / Self Care | Payer: Medicare Other | Source: Ambulatory Visit | Attending: Cardiovascular Disease | Admitting: Cardiovascular Disease

## 2016-05-29 ENCOUNTER — Encounter (HOSPITAL_COMMUNITY): Payer: Medicare Other

## 2016-05-29 DIAGNOSIS — I255 Ischemic cardiomyopathy: Secondary | ICD-10-CM | POA: Insufficient documentation

## 2016-05-29 DIAGNOSIS — K219 Gastro-esophageal reflux disease without esophagitis: Secondary | ICD-10-CM | POA: Diagnosis not present

## 2016-05-29 DIAGNOSIS — G4730 Sleep apnea, unspecified: Secondary | ICD-10-CM | POA: Insufficient documentation

## 2016-05-29 DIAGNOSIS — M79605 Pain in left leg: Secondary | ICD-10-CM

## 2016-05-29 DIAGNOSIS — I739 Peripheral vascular disease, unspecified: Secondary | ICD-10-CM | POA: Insufficient documentation

## 2016-05-29 DIAGNOSIS — E785 Hyperlipidemia, unspecified: Secondary | ICD-10-CM | POA: Diagnosis not present

## 2016-05-29 DIAGNOSIS — F329 Major depressive disorder, single episode, unspecified: Secondary | ICD-10-CM | POA: Diagnosis not present

## 2016-05-29 DIAGNOSIS — I10 Essential (primary) hypertension: Secondary | ICD-10-CM | POA: Diagnosis not present

## 2016-06-06 ENCOUNTER — Encounter: Payer: Self-pay | Admitting: Cardiovascular Disease

## 2016-06-06 NOTE — Telephone Encounter (Signed)
Follow up ° °Pt returned call  °

## 2016-06-06 NOTE — Telephone Encounter (Signed)
This encounter was created in error - please disregard.

## 2016-06-13 DIAGNOSIS — Z6828 Body mass index (BMI) 28.0-28.9, adult: Secondary | ICD-10-CM | POA: Diagnosis not present

## 2016-06-13 DIAGNOSIS — I10 Essential (primary) hypertension: Secondary | ICD-10-CM | POA: Diagnosis not present

## 2016-06-13 DIAGNOSIS — M5137 Other intervertebral disc degeneration, lumbosacral region: Secondary | ICD-10-CM | POA: Diagnosis not present

## 2016-06-26 DIAGNOSIS — M4806 Spinal stenosis, lumbar region: Secondary | ICD-10-CM | POA: Diagnosis not present

## 2016-06-26 DIAGNOSIS — M5137 Other intervertebral disc degeneration, lumbosacral region: Secondary | ICD-10-CM | POA: Diagnosis not present

## 2016-06-26 MED FILL — METHYLPREDNISOLONE 4 MG TAB: 4 | 6 days supply | Qty: 21 | Fill #0

## 2016-07-09 ENCOUNTER — Ambulatory Visit (INDEPENDENT_AMBULATORY_CARE_PROVIDER_SITE_OTHER): Payer: Medicare Other | Admitting: Internal Medicine

## 2016-07-09 ENCOUNTER — Encounter: Payer: Self-pay | Admitting: Internal Medicine

## 2016-07-09 VITALS — BP 124/72 | HR 56 | Temp 97.8°F | Ht 67.0 in | Wt 186.5 lb

## 2016-07-09 DIAGNOSIS — I10 Essential (primary) hypertension: Secondary | ICD-10-CM

## 2016-07-09 DIAGNOSIS — Z79891 Long term (current) use of opiate analgesic: Secondary | ICD-10-CM | POA: Diagnosis not present

## 2016-07-09 LAB — BASIC METABOLIC PANEL
BUN: 16 mg/dL (ref 6–23)
CO2: 32 meq/L (ref 19–32)
Calcium: 9.6 mg/dL (ref 8.4–10.5)
Chloride: 104 mEq/L (ref 96–112)
Creatinine, Ser: 0.92 mg/dL (ref 0.40–1.50)
GFR: 85.21 mL/min (ref >60.00)
Glucose, Bld: 105 mg/dL — ABNORMAL HIGH (ref 70–99)
POTASSIUM: 4.4 meq/L (ref 3.5–5.1)
SODIUM: 140 meq/L (ref 135–145)

## 2016-07-09 NOTE — Assessment & Plan Note (Signed)
HTN: Good med compliance, ambulatory BPs range 97-146, the great majority of readings and in the 120s. Continue HCTZ, micardis, carvedilol. Check a BMP Renal artery stenosis: Saw cardiology, stable, ABIs were done at patient request: Normal. Pneumonia: cxr was neg, resolved  MSK: Continue with pain around the hip with some radiation, Dr. Saintclair Halsted assessing the need to possibly an MRI. RTC 12-2016. CPX
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
## 2016-07-09 NOTE — Progress Notes (Signed)
Pre visit review using our clinic review tool, if applicable. No additional management support is needed unless otherwise documented below in the visit note. 

## 2016-07-09 NOTE — Patient Instructions (Signed)
GO TO THE LAB : Get the blood work  And a urine sample (UDS)   GO TO THE FRONT DESK Schedule your next appointment for a  physical by 12-2016, fasting

## 2016-07-11 DIAGNOSIS — Z6829 Body mass index (BMI) 29.0-29.9, adult: Secondary | ICD-10-CM | POA: Diagnosis not present

## 2016-07-11 DIAGNOSIS — M25551 Pain in right hip: Secondary | ICD-10-CM | POA: Diagnosis not present

## 2016-07-12 ENCOUNTER — Other Ambulatory Visit: Payer: Self-pay | Admitting: Neurosurgery

## 2016-07-12 DIAGNOSIS — M25551 Pain in right hip: Secondary | ICD-10-CM

## 2016-07-21 ENCOUNTER — Ambulatory Visit
Admission: RE | Admit: 2016-07-21 | Discharge: 2016-07-21 | Disposition: A | Discharge status: Home / Self Care | Payer: Medicare Other | Source: Ambulatory Visit | Attending: Neurosurgery | Admitting: Neurosurgery

## 2016-07-21 DIAGNOSIS — M25551 Pain in right hip: Secondary | ICD-10-CM | POA: Diagnosis not present

## 2016-07-25 DIAGNOSIS — M25551 Pain in right hip: Secondary | ICD-10-CM | POA: Diagnosis not present

## 2016-07-31 ENCOUNTER — Other Ambulatory Visit: Payer: Self-pay | Admitting: Internal Medicine

## 2016-08-01 ENCOUNTER — Telehealth: Payer: Self-pay

## 2016-08-01 NOTE — Telephone Encounter (Signed)
UDS: 07/09/2016  Negative for Tramadol: PRN   Low risk per Dr. Larose Kells 08/01/2016

## 2016-08-16 ENCOUNTER — Other Ambulatory Visit: Payer: Self-pay | Admitting: Internal Medicine

## 2016-08-19 DIAGNOSIS — M7061 Trochanteric bursitis, right hip: Secondary | ICD-10-CM | POA: Diagnosis not present

## 2016-08-19 DIAGNOSIS — M5416 Radiculopathy, lumbar region: Secondary | ICD-10-CM | POA: Diagnosis not present

## 2016-08-19 DIAGNOSIS — M4306 Spondylolysis, lumbar region: Secondary | ICD-10-CM | POA: Diagnosis not present

## 2016-08-19 DIAGNOSIS — M5136 Other intervertebral disc degeneration, lumbar region: Secondary | ICD-10-CM | POA: Diagnosis not present

## 2016-08-22 DIAGNOSIS — M1611 Unilateral primary osteoarthritis, right hip: Secondary | ICD-10-CM | POA: Diagnosis not present

## 2016-09-04 ENCOUNTER — Other Ambulatory Visit: Payer: Self-pay | Admitting: Internal Medicine

## 2016-09-04 NOTE — Telephone Encounter (Signed)
Rx denied, MyChart message sent w/ recommendations.

## 2016-09-04 NOTE — Telephone Encounter (Signed)
Pt is requesting refill on Hydralazine. No longer on med list. Please advise.

## 2016-09-04 NOTE — Telephone Encounter (Signed)
Advise patient: Hydralazine  was discontinued a few months ago, if BPs are running high please let me know or arrange an appointment.

## 2016-10-02 ENCOUNTER — Encounter: Payer: Self-pay | Admitting: Internal Medicine

## 2016-10-07 ENCOUNTER — Telehealth: Payer: Self-pay

## 2016-10-07 NOTE — Telephone Encounter (Signed)
Pt's wife, Jerl Mina, dropped off Pt's last few weeks of BP readings. Placed in MD red folder for review.

## 2016-10-07 NOTE — Telephone Encounter (Signed)
My Chart message sent

## 2016-10-07 NOTE — Telephone Encounter (Signed)
BPs for this month are very good, he has occasional knee low blood pressure, one time it was 80/42 but later that day 105/54. Send him a Pharmacist, community message: [name], your blood pressure seems well-controlled, from time to time EKGs in the low side, if that happened and you feel very weak or dizzy please let us know otherwise  skip the next dose of one or`your  medications either micardis, carvedilol or hydrochlorothiazide.

## 2016-11-01 ENCOUNTER — Encounter: Payer: Self-pay | Admitting: Internal Medicine

## 2016-11-14 ENCOUNTER — Other Ambulatory Visit: Payer: Self-pay | Admitting: Cardiovascular Disease

## 2016-11-18 ENCOUNTER — Other Ambulatory Visit: Payer: Self-pay

## 2016-11-18 MED ORDER — HYDROCHLOROTHIAZIDE 12.5 MG PO CAPS: 12.5000 mg | ORAL_CAPSULE | Freq: Every day | ORAL | 3 refills | Status: DC

## 2016-11-18 MED FILL — HYDROCHLOROTHIAZIDE 12.5 MG: 12.5 | 90 days supply | Qty: 90 | Fill #0

## 2016-11-20 DIAGNOSIS — H40013 Open angle with borderline findings, low risk, bilateral: Secondary | ICD-10-CM | POA: Diagnosis not present

## 2016-11-20 DIAGNOSIS — H353131 Nonexudative age-related macular degeneration, bilateral, early dry stage: Secondary | ICD-10-CM | POA: Diagnosis not present

## 2016-11-20 DIAGNOSIS — H2513 Age-related nuclear cataract, bilateral: Secondary | ICD-10-CM | POA: Diagnosis not present

## 2016-11-29 DIAGNOSIS — H25811 Combined forms of age-related cataract, right eye: Secondary | ICD-10-CM | POA: Diagnosis not present

## 2016-12-02 MED FILL — DUREZOL 0.05% EYE DROPS: 0.05 | 25 days supply | Qty: 5 | Fill #0

## 2016-12-02 MED FILL — BESIVANCE 0.6% SUSP: 0.6 | 50 days supply | Qty: 5 | Fill #0

## 2016-12-03 DIAGNOSIS — M5136 Other intervertebral disc degeneration, lumbar region: Secondary | ICD-10-CM | POA: Diagnosis not present

## 2016-12-03 DIAGNOSIS — M542 Cervicalgia: Secondary | ICD-10-CM | POA: Diagnosis not present

## 2016-12-11 DIAGNOSIS — H2511 Age-related nuclear cataract, right eye: Secondary | ICD-10-CM | POA: Diagnosis not present

## 2016-12-11 DIAGNOSIS — H25811 Combined forms of age-related cataract, right eye: Secondary | ICD-10-CM | POA: Diagnosis not present

## 2016-12-25 ENCOUNTER — Ambulatory Visit: Payer: Medicare Other | Admitting: *Deleted

## 2016-12-25 VITALS — Ht 67.0 in | Wt 192.8 lb

## 2016-12-25 DIAGNOSIS — Z86010 Personal history of colonic polyps: Secondary | ICD-10-CM

## 2016-12-25 MED ORDER — NA SULFATE-K SULFATE-MG SULF 17.5-3.13-1.6 GM/177ML PO SOLN: 1.0000 | Freq: Once | ORAL | 0 refills | Status: AC

## 2016-12-25 MED FILL — SUPREP BOWEL PREP KIT: 17.5-3.13-1 | 1 days supply | Qty: 354 | Fill #0

## 2016-12-25 NOTE — Progress Notes (Signed)
No allergies to eggs or soy. No problems with anesthesia.  Pt given Emmi instructions for colonoscopy  No oxygen use  No diet drug use  

## 2016-12-27 DIAGNOSIS — H2512 Age-related nuclear cataract, left eye: Secondary | ICD-10-CM | POA: Diagnosis not present

## 2017-01-01 DIAGNOSIS — H25812 Combined forms of age-related cataract, left eye: Secondary | ICD-10-CM | POA: Diagnosis not present

## 2017-01-01 DIAGNOSIS — H2512 Age-related nuclear cataract, left eye: Secondary | ICD-10-CM | POA: Diagnosis not present

## 2017-01-08 ENCOUNTER — Encounter: Payer: Medicare Other | Admitting: Internal Medicine

## 2017-01-14 ENCOUNTER — Telehealth: Payer: Self-pay | Admitting: *Deleted

## 2017-01-14 DIAGNOSIS — M7061 Trochanteric bursitis, right hip: Secondary | ICD-10-CM | POA: Diagnosis not present

## 2017-01-14 DIAGNOSIS — M5136 Other intervertebral disc degeneration, lumbar region: Secondary | ICD-10-CM | POA: Diagnosis not present

## 2017-01-14 DIAGNOSIS — M5416 Radiculopathy, lumbar region: Secondary | ICD-10-CM | POA: Diagnosis not present

## 2017-01-14 NOTE — Telephone Encounter (Signed)
AWV scheduled for 01/16/17 @8 .
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
## 2017-01-15 NOTE — Progress Notes (Signed)
Pre visit review using our clinic review tool, if applicable. No additional management support is needed unless otherwise documented below in the visit note. 

## 2017-01-16 ENCOUNTER — Ambulatory Visit (INDEPENDENT_AMBULATORY_CARE_PROVIDER_SITE_OTHER): Payer: Medicare Other | Admitting: Internal Medicine

## 2017-01-16 ENCOUNTER — Encounter: Payer: Self-pay | Admitting: Internal Medicine

## 2017-01-16 VITALS — BP 180/90 | HR 50 | Ht 67.0 in | Wt 191.2 lb

## 2017-01-16 DIAGNOSIS — R739 Hyperglycemia, unspecified: Secondary | ICD-10-CM

## 2017-01-16 DIAGNOSIS — Z0000 Encounter for general adult medical examination without abnormal findings: Secondary | ICD-10-CM

## 2017-01-16 DIAGNOSIS — Z23 Encounter for immunization: Secondary | ICD-10-CM | POA: Diagnosis not present

## 2017-01-16 DIAGNOSIS — Z0001 Encounter for general adult medical examination with abnormal findings: Secondary | ICD-10-CM | POA: Diagnosis not present

## 2017-01-16 DIAGNOSIS — I10 Essential (primary) hypertension: Secondary | ICD-10-CM

## 2017-01-16 LAB — HEMOGLOBIN A1C: Hgb A1c MFr Bld: 6 % (ref 4.6–6.5)

## 2017-01-16 LAB — BASIC METABOLIC PANEL
BUN: 19 mg/dL (ref 6–23)
CALCIUM: 9.7 mg/dL (ref 8.4–10.5)
CO2: 29 meq/L (ref 19–32)
CREATININE: 0.9 mg/dL (ref 0.40–1.50)
Chloride: 106 mEq/L (ref 96–112)
GFR: 87.28 mL/min (ref >60.00)
Glucose, Bld: 96 mg/dL (ref 70–99)
Potassium: 4 mEq/L (ref 3.5–5.1)
Sodium: 140 mEq/L (ref 135–145)

## 2017-01-16 LAB — CBC WITH DIFFERENTIAL/PLATELET
BASOS ABS: 0.1 10*3/uL (ref 0.0–0.1)
Basophils Relative: 0.7 % (ref 0.0–3.0)
EOS ABS: 0.2 10*3/uL (ref 0.0–0.7)
Eosinophils Relative: 2.1 % (ref 0.0–5.0)
HCT: 43.1 % (ref 39.0–52.0)
Hemoglobin: 14.8 g/dL (ref 13.0–17.0)
LYMPHS PCT: 17.6 % (ref 12.0–46.0)
Lymphs Abs: 2 10*3/uL (ref 0.7–4.0)
MCHC: 34.3 g/dL (ref 30.0–36.0)
MCV: 90.6 fl (ref 78.0–100.0)
Monocytes Absolute: 0.7 10*3/uL (ref 0.1–1.0)
Monocytes Relative: 5.9 % (ref 3.0–12.0)
NEUTROS ABS: 8.6 10*3/uL — AB (ref 1.4–7.7)
Neutrophils Relative %: 73.7 % (ref 43.0–77.0)
Platelets: 233 10*3/uL (ref 150.0–400.0)
RBC: 4.76 Mil/uL (ref 4.22–5.81)
RDW: 12.8 % (ref 11.5–15.5)
WBC: 11.6 10*3/uL — AB (ref 4.0–10.5)
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
## 2017-01-16 NOTE — Patient Instructions (Addendum)
Follow up with Dr.Paz as scheduled after this appointment.  Continue to eat heart healthy diet (full of fruits, vegetables, whole grains, lean protein, water--limit salt, fat, and sugar intake) and increase physical activity as tolerated.  Bring a copy of your advance directives to your next office visit.  Continue monitoring and recording blood pressures at home.   ================================  GO TO THE LAB : Get the blood work     GO TO THE FRONT DESK Schedule your next appointment for a  routine check up in 3-4 months  Schedule a nurse visit 10 days from today. Bring your blood pressure log, bring your on call to be sure is accurate.

## 2017-01-16 NOTE — Assessment & Plan Note (Signed)
Had a Medicare wellness today Had a colonoscopy scheduled for last week canceled due to weather, plans to reschedule Flu shot today

## 2017-01-16 NOTE — Assessment & Plan Note (Signed)
PLAN  prediabetes: Check A1c HTN: Currently on Micardis, HCTZ and carvedilol. Ambulatory BPs usually normal except for yesterday. BP today is elevated, checked 3 times. He does have renal artery stenosis. Needs close follow-up  Plan: Check a BMP, CBC, continue monitoring, nurse visit in 10 days, BP check. Patient to bring his own cuff to be sure is accurate. Call if sx DJD: Status post recent fall, saw orthopedics, doing better. He received extensive counseling about fall prevention. Primary care: Had a colonoscopy  scheduled for last week, canceled due to weather. Plans to reschedule Flu shot today RTC 3-4 months.

## 2017-01-28 ENCOUNTER — Ambulatory Visit (INDEPENDENT_AMBULATORY_CARE_PROVIDER_SITE_OTHER): Payer: Medicare Other | Admitting: Internal Medicine

## 2017-01-28 VITALS — BP 167/77 | HR 56

## 2017-01-28 DIAGNOSIS — I10 Essential (primary) hypertension: Secondary | ICD-10-CM | POA: Diagnosis not present

## 2017-01-28 NOTE — Progress Notes (Signed)
Pre visit review using our clinic review tool, if applicable. No additional management support is needed unless otherwise documented below in the visit note.    Patient presents in office for blood pressure check per OV note 01/16/17. Also, he has brought in his own machine from home to compare readings. Medications & regimen were reviewed with the patient. Today's readings are: BP 155/81 P 59 & BP 167/77 P 56. Utilizing the patient's home cuff, readings were BP 165/81 & BP 163/84. The patient has been maintaining a log as well, which yield readings as low as 130's/60's. PCP was made aware.  Per Dr. Larose Kells: Continue current medications & regimen. Keep follow-up appointment with PCP on 04/16/17 at 8:45 AM.  Informed patient of the provider's recommendations. He verbalized understanding and did not have any further concerns before leaving the nurse visit.  Home BPs seem okay, his cuff seems to be calibrated consequently no changes, continue monitoring. Kathlene November, MD

## 2017-01-28 NOTE — Patient Instructions (Addendum)
Per Dr. Larose Kells: Continue current medications & regimen. Keep follow-up appointment with PCP on 04/16/17 at 8:45 AM.

## 2017-03-11 DIAGNOSIS — M25551 Pain in right hip: Secondary | ICD-10-CM | POA: Diagnosis not present

## 2017-03-14 DIAGNOSIS — M5416 Radiculopathy, lumbar region: Secondary | ICD-10-CM | POA: Diagnosis not present

## 2017-03-14 DIAGNOSIS — M5136 Other intervertebral disc degeneration, lumbar region: Secondary | ICD-10-CM | POA: Diagnosis not present

## 2017-03-14 DIAGNOSIS — M4306 Spondylolysis, lumbar region: Secondary | ICD-10-CM | POA: Diagnosis not present

## 2017-03-14 DIAGNOSIS — M7061 Trochanteric bursitis, right hip: Secondary | ICD-10-CM | POA: Diagnosis not present

## 2017-03-14 DIAGNOSIS — M7071 Other bursitis of hip, right hip: Secondary | ICD-10-CM | POA: Diagnosis not present

## 2017-04-16 ENCOUNTER — Ambulatory Visit (INDEPENDENT_AMBULATORY_CARE_PROVIDER_SITE_OTHER): Payer: Medicare Other | Admitting: Internal Medicine

## 2017-04-16 ENCOUNTER — Encounter: Payer: Self-pay | Admitting: Internal Medicine

## 2017-04-16 VITALS — BP 126/80 | HR 59 | Temp 98.0°F | Resp 14 | Ht 67.0 in | Wt 192.4 lb

## 2017-04-16 DIAGNOSIS — E785 Hyperlipidemia, unspecified: Secondary | ICD-10-CM

## 2017-04-16 DIAGNOSIS — I10 Essential (primary) hypertension: Secondary | ICD-10-CM

## 2017-04-16 LAB — BASIC METABOLIC PANEL
BUN: 19 mg/dL (ref 6–23)
CO2: 30 mEq/L (ref 19–32)
CREATININE: 1.16 mg/dL (ref 0.40–1.50)
Calcium: 9.8 mg/dL (ref 8.4–10.5)
Chloride: 104 mEq/L (ref 96–112)
GFR: 65.08 mL/min (ref >60.00)
GLUCOSE: 110 mg/dL — AB (ref 70–99)
Potassium: 4.7 mEq/L (ref 3.5–5.1)
Sodium: 139 mEq/L (ref 135–145)

## 2017-04-16 LAB — LIPID PANEL
CHOLESTEROL: 232 mg/dL — AB (ref 0–200)
HDL: 44.2 mg/dL (ref >39.00)
LDL Cholesterol: 153 mg/dL — ABNORMAL HIGH (ref 0–99)
NonHDL: 187.57
Total CHOL/HDL Ratio: 5
Triglycerides: 171 mg/dL — ABNORMAL HIGH (ref 0.0–149.0)
VLDL: 34.2 mg/dL (ref 0.0–40.0)

## 2017-04-16 MED ORDER — TELMISARTAN 80 MG PO TABS: 80.0000 mg | ORAL_TABLET | Freq: Every day | ORAL | 1 refills | Status: DC
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
## 2017-04-16 NOTE — Progress Notes (Signed)
Pre visit review using our clinic review tool, if applicable. No additional management support is needed unless otherwise documented below in the visit note. 

## 2017-04-16 NOTE — Assessment & Plan Note (Signed)
HTN: Morning numbers often times low in the 90s sometimes 86/45. BPs in the afternoon very good. Asx from low blood pressure. Currently on Micardis twice a day, HCTZ in the morning, carvedilol twice a day. Recommend to stop afternoon Micardis and continue monitoring BPs. Hyperlipidemia: Diet control, has been reluctant to take medications, check a FLP. Elevated white WBCs: See last CBC, slightly elevated, recheck in few months. RTC 4 months

## 2017-04-16 NOTE — Patient Instructions (Addendum)
GO TO THE LAB : Get the blood work     GO TO THE FRONT DESK Schedule your next appointment for a  routine checkup in 4 months   Continue checking your BPs twice a day Same medications except for Micardis, take only one tablet in the morning   Be sure your blood pressure is between 110/65 and  145/85.  if it is consistently higher or lower, let me know

## 2017-05-06 ENCOUNTER — Ambulatory Visit (INDEPENDENT_AMBULATORY_CARE_PROVIDER_SITE_OTHER): Payer: Medicare Other | Admitting: Cardiovascular Disease

## 2017-05-06 VITALS — BP 158/80 | HR 58 | Ht 67.0 in | Wt 190.8 lb

## 2017-05-06 DIAGNOSIS — I10 Essential (primary) hypertension: Secondary | ICD-10-CM

## 2017-05-06 DIAGNOSIS — I701 Atherosclerosis of renal artery: Secondary | ICD-10-CM

## 2017-05-06 DIAGNOSIS — E785 Hyperlipidemia, unspecified: Secondary | ICD-10-CM | POA: Diagnosis not present

## 2017-05-06 MED ORDER — ROSUVASTATIN CALCIUM 5 MG PO TABS: 5.0000 mg | ORAL_TABLET | Freq: Every day | ORAL | 3 refills | Status: DC

## 2017-05-06 MED ORDER — HYDROCHLOROTHIAZIDE 12.5 MG PO CAPS: 12.5000 mg | ORAL_CAPSULE | Freq: Every day | ORAL | 3 refills | Status: DC

## 2017-05-06 MED FILL — ROSUVASTATIN CALCIUM 5 MG T: 5 | 90 days supply | Qty: 90 | Fill #0

## 2017-05-06 MED FILL — HYDROCHLOROTHIAZIDE 12.5 MG: 12.5 | 90 days supply | Qty: 90 | Fill #0
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
## 2017-05-06 NOTE — Patient Instructions (Addendum)
Medication Instructions:  Your physician has recommended you make the following change in your medication:  1. START Rosuvastatin (Crestor) 5mg  take one tablet by mouth daily  Labwork: Your physician recommends that you return for a FASTING LIPID and LIVER in 6 WEEKS (06/17/2017)--nothing to eat or drink after midnight (paper orders given to the patient to have labs drawn on 1st floor at [facility] location)  Testing/Procedures: No new orders.   Follow-Up: Your physician wants you to follow-up in: 1 YEAR with Dr Fletcher Anon. You will receive a reminder letter in the mail two months in advance. If you don't receive a letter, please call our office to schedule the follow-up appointment.   Any Other Special Instructions Will Be Listed Below (If Applicable).     If you need a refill on your cardiac medications before your next appointment, please call your pharmacy.

## 2017-05-13 ENCOUNTER — Other Ambulatory Visit: Payer: Self-pay | Admitting: Internal Medicine

## 2017-05-29 DIAGNOSIS — H353131 Nonexudative age-related macular degeneration, bilateral, early dry stage: Secondary | ICD-10-CM | POA: Diagnosis not present

## 2017-05-29 DIAGNOSIS — H40013 Open angle with borderline findings, low risk, bilateral: Secondary | ICD-10-CM | POA: Diagnosis not present

## 2017-05-29 DIAGNOSIS — Z961 Presence of intraocular lens: Secondary | ICD-10-CM | POA: Diagnosis not present

## 2017-06-17 ENCOUNTER — Other Ambulatory Visit: Payer: Medicare Other | Admitting: *Deleted

## 2017-06-17 DIAGNOSIS — E7800 Pure hypercholesterolemia, unspecified: Secondary | ICD-10-CM

## 2017-06-17 LAB — LIPID PANEL
CHOLESTEROL TOTAL: 140 mg/dL (ref 100–199)
Chol/HDL Ratio: 3.7 ratio (ref 0.0–5.0)
HDL: 38 mg/dL — ABNORMAL LOW (ref >39)
LDL CALC: 75 mg/dL (ref 0–99)
TRIGLYCERIDES: 134 mg/dL (ref 0–149)
VLDL CHOLESTEROL CAL: 27 mg/dL (ref 5–40)

## 2017-06-17 LAB — HEPATIC FUNCTION PANEL
ALT: 21 IU/L (ref 0–44)
AST: 24 IU/L (ref 0–40)
Albumin: 4.1 g/dL (ref 3.5–4.8)
Alkaline Phosphatase: 90 IU/L (ref 39–117)
BILIRUBIN TOTAL: 0.4 mg/dL (ref 0.0–1.2)
BILIRUBIN, DIRECT: 0.12 mg/dL (ref 0.00–0.40)
Total Protein: 5.9 g/dL — ABNORMAL LOW (ref 6.0–8.5)

## 2017-06-17 NOTE — Addendum Note (Signed)
Addended by: Eulis Foster on: 06/17/2017 07:57 AM   Modules accepted: Orders

## 2017-06-25 ENCOUNTER — Other Ambulatory Visit: Payer: Self-pay | Admitting: Internal Medicine

## 2017-06-27 ENCOUNTER — Other Ambulatory Visit: Payer: Self-pay | Admitting: Internal Medicine

## 2017-07-31 ENCOUNTER — Telehealth: Payer: Self-pay | Admitting: Cardiovascular Disease

## 2017-07-31 NOTE — Telephone Encounter (Signed)
Pt faxed letter to Dr. Fletcher Anon indicating he has been having side effects of crestor including headache, dehydration, loss of appetite, sleeplessness, leg cramps, disorientation, stomach cramps and short-tempered. Per Dr. Fletcher Anon, pt may hold crestor x 2 weeks then update [place] on sx. Reviewed recommendations w/pt. He stopped taking crestor last week and sx have improved. He will continue to hold it for another week then call with an update. Pt agreeable w/plan.

## 2017-08-07 ENCOUNTER — Other Ambulatory Visit: Payer: Self-pay

## 2017-08-07 MED ORDER — EZETIMIBE 10 MG PO TABS: 10.0000 mg | ORAL_TABLET | Freq: Every day | ORAL | 3 refills | Status: DC

## 2017-08-07 MED FILL — EZETIMIBE 10 MG TABLET: 10 | 30 days supply | Qty: 30 | Fill #0

## 2017-08-07 NOTE — Addendum Note (Signed)
Addended by: Georgiana Shore on: 08/07/2017 04:51 PM   Modules accepted: Orders

## 2017-08-07 NOTE — Telephone Encounter (Signed)
Pt calling stating he still having headaches. He doesn't want the stomach aches or mood change back.  He would like to know what else may be able to take  Please advise

## 2017-08-07 NOTE — Telephone Encounter (Addendum)
Pt stopped taking crestor 5mg  last week d/t sx of HA, stomach aches and mood changes. Sx have resolved except for the headaches.  Would like further recommendations for cholesterol medication. Routed to Dr. Fletcher Anon.  Medication list updated

## 2017-08-07 NOTE — Telephone Encounter (Signed)
We can try Zetia 10 mg daily. He should check with his primary care physician about the headache.

## 2017-08-07 NOTE — Telephone Encounter (Signed)
Pt agreeable w/Dr. Tyrell Antonio recommendations and has an appt tomorrow with PCP. Prescription sent to [facility] per pt request.

## 2017-08-08 ENCOUNTER — Encounter: Payer: Self-pay | Admitting: Internal Medicine

## 2017-08-08 ENCOUNTER — Ambulatory Visit (INDEPENDENT_AMBULATORY_CARE_PROVIDER_SITE_OTHER): Payer: Medicare Other | Admitting: Internal Medicine

## 2017-08-08 VITALS — BP 124/76 | HR 56 | Temp 97.6°F | Resp 14 | Ht 67.0 in | Wt 189.5 lb

## 2017-08-08 DIAGNOSIS — E785 Hyperlipidemia, unspecified: Secondary | ICD-10-CM

## 2017-08-08 DIAGNOSIS — I701 Atherosclerosis of renal artery: Secondary | ICD-10-CM | POA: Diagnosis not present

## 2017-08-08 DIAGNOSIS — D72829 Elevated white blood cell count, unspecified: Secondary | ICD-10-CM | POA: Diagnosis not present

## 2017-08-08 DIAGNOSIS — I10 Essential (primary) hypertension: Secondary | ICD-10-CM

## 2017-08-08 LAB — BASIC METABOLIC PANEL
BUN: 22 mg/dL (ref 6–23)
CO2: 32 mEq/L (ref 19–32)
Calcium: 9.4 mg/dL (ref 8.4–10.5)
Chloride: 104 mEq/L (ref 96–112)
Creatinine, Ser: 1.07 mg/dL (ref 0.40–1.50)
GFR: 71.37 mL/min (ref >60.00)
GLUCOSE: 110 mg/dL — AB (ref 70–99)
Potassium: 4.4 mEq/L (ref 3.5–5.1)
SODIUM: 140 meq/L (ref 135–145)

## 2017-08-08 LAB — CBC WITH DIFFERENTIAL/PLATELET
BASOS ABS: 0.1 10*3/uL (ref 0.0–0.1)
Basophils Relative: 1.7 % (ref 0.0–3.0)
EOS PCT: 5.2 % — AB (ref 0.0–5.0)
Eosinophils Absolute: 0.4 10*3/uL (ref 0.0–0.7)
HEMATOCRIT: 44.8 % (ref 39.0–52.0)
HEMOGLOBIN: 15.1 g/dL (ref 13.0–17.0)
LYMPHS PCT: 24.7 % (ref 12.0–46.0)
Lymphs Abs: 1.8 10*3/uL (ref 0.7–4.0)
MCHC: 33.8 g/dL (ref 30.0–36.0)
MCV: 92.2 fl (ref 78.0–100.0)
MONOS PCT: 8.1 % (ref 3.0–12.0)
Monocytes Absolute: 0.6 10*3/uL (ref 0.1–1.0)
Neutro Abs: 4.4 10*3/uL (ref 1.4–7.7)
Neutrophils Relative %: 60.3 % (ref 43.0–77.0)
Platelets: 236 10*3/uL (ref 150.0–400.0)
RBC: 4.86 Mil/uL (ref 4.22–5.81)
RDW: 12.4 % (ref 11.5–15.5)
WBC: 7.3 10*3/uL (ref 4.0–10.5)

## 2017-08-08 MED ORDER — GABAPENTIN 100 MG PO CAPS: ORAL_CAPSULE | ORAL | 0 refills | Status: DC

## 2017-08-08 MED FILL — GABAPENTIN 100 MG CAPSULE: 100 | 60 days supply | Qty: 120 | Fill #0

## 2017-08-08 NOTE — Patient Instructions (Addendum)
GO TO THE LAB : Get the blood work     GO TO THE FRONT DESK Schedule your next appointment for a  routine office visit by January 2019, fasting  Consider Medicare wellness with one of our  nurses  Continue checking your blood pressures, if your blood pressure is in the low side and you felt dizzy, weak, fatigue that means your blood pressure is low, let me know.  Decrease gabapentin 400 mg from 2 tablets daily to one tablet daily for one month  Then started a new gabapentin 100 mg: One tablet twice a day for one month  Then take gabapentin 100 mg one tablet daily for a month  Then stop
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
## 2017-08-08 NOTE — Progress Notes (Signed)
Pre visit review using our clinic review tool, if applicable. No additional management support is needed unless otherwise documented below in the visit note. 

## 2017-08-08 NOTE — Assessment & Plan Note (Signed)
HTN: BPs in the morning slightly low, he is asx, feels well and plays golf 5 times a week. Continue Micardis, HCTZ, carvedilol, check a BMP. Hyperlipidemia: Took Crestor, cholesterol improved significantly however he developed abdominal pain, changes and headaches. Stopped Crestor, cardiology just rx Zetia, he plans to start tomorrow. Headache: After he took Crestor 3 months ago, still there, mild. Recommend observation for now. Will let me know if not gradually decreasing. DJD: On gabapentin for a while, no clear benefits, we agreed to gradually decrease gabapentin (current dose: 400 mg twice a day). See AVS. If pain gets worse after he gets off gabapentin, means that it was helping and we need to restart gaba and even go to a higher dose. RAS: Saw cardiology, stable. Leukocytosis: Recheck a CBC. Was unable to do a colonoscopy due to transportation issues. RTC 12-2017.

## 2017-08-15 ENCOUNTER — Ambulatory Visit: Payer: Medicare Other | Admitting: Internal Medicine

## 2017-09-05 ENCOUNTER — Other Ambulatory Visit: Payer: Self-pay | Admitting: Neurosurgery

## 2017-09-05 DIAGNOSIS — S0990XS Unspecified injury of head, sequela: Secondary | ICD-10-CM | POA: Diagnosis not present

## 2017-09-05 DIAGNOSIS — G44309 Post-traumatic headache, unspecified, not intractable: Secondary | ICD-10-CM | POA: Diagnosis not present

## 2017-09-05 DIAGNOSIS — R413 Other amnesia: Secondary | ICD-10-CM

## 2017-09-08 ENCOUNTER — Other Ambulatory Visit: Payer: Self-pay | Admitting: Neurosurgery

## 2017-09-08 DIAGNOSIS — G44309 Post-traumatic headache, unspecified, not intractable: Secondary | ICD-10-CM

## 2017-09-11 ENCOUNTER — Ambulatory Visit
Admission: RE | Admit: 2017-09-11 | Discharge: 2017-09-11 | Disposition: A | Discharge status: Home / Self Care | Payer: Medicare Other | Source: Ambulatory Visit | Attending: Neurosurgery | Admitting: Neurosurgery

## 2017-09-11 DIAGNOSIS — R413 Other amnesia: Secondary | ICD-10-CM

## 2017-09-11 DIAGNOSIS — G44309 Post-traumatic headache, unspecified, not intractable: Secondary | ICD-10-CM

## 2017-09-16 DIAGNOSIS — G44309 Post-traumatic headache, unspecified, not intractable: Secondary | ICD-10-CM | POA: Diagnosis not present

## 2017-09-16 DIAGNOSIS — R413 Other amnesia: Secondary | ICD-10-CM | POA: Diagnosis not present

## 2017-09-16 DIAGNOSIS — I10 Essential (primary) hypertension: Secondary | ICD-10-CM | POA: Diagnosis not present

## 2017-09-16 DIAGNOSIS — Z6829 Body mass index (BMI) 29.0-29.9, adult: Secondary | ICD-10-CM | POA: Diagnosis not present

## 2017-09-18 ENCOUNTER — Other Ambulatory Visit: Payer: Medicare Other

## 2017-09-19 ENCOUNTER — Other Ambulatory Visit: Payer: Self-pay

## 2017-09-19 ENCOUNTER — Telehealth: Payer: Self-pay | Admitting: Cardiovascular Disease

## 2017-09-19 DIAGNOSIS — Z961 Presence of intraocular lens: Secondary | ICD-10-CM | POA: Diagnosis not present

## 2017-09-19 DIAGNOSIS — H353131 Nonexudative age-related macular degeneration, bilateral, early dry stage: Secondary | ICD-10-CM | POA: Diagnosis not present

## 2017-09-19 DIAGNOSIS — R51 Headache: Secondary | ICD-10-CM | POA: Diagnosis not present

## 2017-09-19 MED ORDER — EZETIMIBE 10 MG PO TABS: 10.0000 mg | ORAL_TABLET | Freq: Every day | ORAL | 3 refills | Status: DC

## 2017-09-19 NOTE — Telephone Encounter (Signed)
Pt faxed information regarding continued headaches even after switching from crestor to zetia 10mg  in August.  Pt had been experiencing stomach issues and headache and felt it was r/t crestor. Stomach issues have resolved but he continues to experience non-stop, life-altering headaches. He followed up w/PCP as recommended.  Per August 17 PCP notes "Headache: After he took Crestor 3 months ago, still there, mild. Recommend observation for now. Will let me know if not gradually decreasing."  I have left a message on pt's VM asking for a return call to further discuss sx.

## 2017-09-19 NOTE — Telephone Encounter (Signed)
Pt reports continued HA directly above his eyes. Pt felt headache and other sx of stomach issues and malaise was d/t crestor thus it was changed to zetia 10mg  in August. Stomach issues resolved however, chronic headache continued.  Neurosurgeon ordered MRI and MRA which were both negative He spent two hours today with an eye surgeon. All tests there were also negative. He had double cataract surgery December 2017 and has a hx of major eye issues.  Reports headaches off and on for 30 years but has become constant and is affecting quality of life.  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  Neurosurgeon sent referral to neurologist. Awaiting appt.  Pt is agreeable that zetia is most likely not causing sx and requests refills sent to [facility].  He will continue to try and find cause of headaches and update our office as necessary. As he would like to make Dr. Fletcher Anon aware, I will route note to him to update.

## 2017-09-19 NOTE — Telephone Encounter (Signed)
Pt returning our call °Please call back ° °

## 2017-09-19 NOTE — Telephone Encounter (Signed)
Ok

## 2017-10-08 ENCOUNTER — Encounter: Payer: Self-pay | Admitting: Medical

## 2017-10-08 ENCOUNTER — Ambulatory Visit (INDEPENDENT_AMBULATORY_CARE_PROVIDER_SITE_OTHER): Payer: Medicare Other | Admitting: Medical

## 2017-10-08 VITALS — BP 150/80 | HR 53 | Temp 97.9°F | Resp 16 | Ht 67.0 in | Wt 189.2 lb

## 2017-10-08 DIAGNOSIS — Z23 Encounter for immunization: Secondary | ICD-10-CM

## 2017-10-08 DIAGNOSIS — I10 Essential (primary) hypertension: Secondary | ICD-10-CM

## 2017-10-08 DIAGNOSIS — I701 Atherosclerosis of renal artery: Secondary | ICD-10-CM | POA: Diagnosis not present

## 2017-10-08 DIAGNOSIS — R51 Headache: Secondary | ICD-10-CM | POA: Diagnosis not present

## 2017-10-08 DIAGNOSIS — R519 Headache, unspecified: Secondary | ICD-10-CM

## 2017-10-08 LAB — SEDIMENTATION RATE: SED RATE: 1 mm/h (ref 0–20)

## 2017-10-08 MED ORDER — FLUTICASONE PROPIONATE 50 MCG/ACT NA SUSP: 2.0000 | Freq: Every day | NASAL | 0 refills | Status: DC

## 2017-10-08 MED ORDER — AZELASTINE HCL 0.1 % NA SOLN: 2.0000 | Freq: Two times a day (BID) | NASAL | 1 refills | Status: DC

## 2017-10-08 MED FILL — FLUTICASONE PROP 50 MCG SPR: 50 | 30 days supply | Qty: 16 | Fill #0

## 2017-10-08 MED FILL — AZELASTINE 0.1% (137 MCG) S: 0.1 | 30 days supply | Qty: 30 | Fill #0

## 2017-10-08 NOTE — Patient Instructions (Addendum)
For your recent frontal dull ha will rx flonase and astelin. This may be allergy related as your mri studies were negative. Also will get sed rate as you report occasional temporal area headache.  If you get worse ha with neurologic type signs and symptoms then ED evaluation.  Keep appointment with neurologist.  Your bp have been high here historically and your bp machine at home was confirmed to give accurate readings. You may have htn but also white coat type reaction. Please check your bp later this afternoon when you are relaxed at pharmacy and notify me of the results.  Follow up in 7 days or as needed.  Later pt sent my chart message.  First off, thanks for seeing me on such short notice and spending time to hear my problems.  Second, I stopped at [facility] on the way home and took my blood pressure. It read 151/77. I  left and got something to eat and then returned for a second reading. It read 119/61. The  fact that I had not eaten anything, coupled with the pre-visit anxiety, may have contributed  to the higher readings. Will keep checking and will also keep you informed.
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
## 2017-10-13 ENCOUNTER — Other Ambulatory Visit: Payer: Self-pay | Admitting: Internal Medicine

## 2017-10-14 ENCOUNTER — Encounter: Payer: Self-pay | Admitting: Neurology

## 2017-10-15 ENCOUNTER — Encounter: Payer: Self-pay | Admitting: Medical

## 2017-11-21 LAB — BASIC METABOLIC PANEL
BUN: 19 (ref 4–21)
Creatinine: 0.9 (ref 0.6–1.3)
GLUCOSE: 107
POTASSIUM: 4.3 (ref 3.4–5.3)
Sodium: 139 (ref 137–147)

## 2017-11-21 LAB — HEPATIC FUNCTION PANEL
ALT: 30 (ref 10–40)
AST: 20 (ref 14–40)
Alkaline Phosphatase: 106 (ref 25–125)
BILIRUBIN DIRECT: 0.1 (ref 0.01–0.4)
Bilirubin, Total: 0.5

## 2017-11-21 LAB — CBC AND DIFFERENTIAL
HEMATOCRIT: 45 (ref 41–53)
HEMOGLOBIN: 15.3 (ref 13.5–17.5)
Neutrophils Absolute: 6
PLATELETS: 274 (ref 150–399)

## 2017-11-21 LAB — LIPID PANEL
Cholesterol: 199 (ref 0–200)
HDL: 47 (ref 35–70)
LDL CALC: 107
Triglycerides: 225 — AB (ref 40–160)

## 2017-11-21 LAB — PSA: PSA: 0.96

## 2017-11-21 LAB — TSH: TSH: 0.74 (ref 0.41–5.90)

## 2017-11-21 LAB — HEMOGLOBIN A1C: Hemoglobin A1C: 6.4

## 2017-12-18 DIAGNOSIS — I10 Essential (primary) hypertension: Secondary | ICD-10-CM | POA: Diagnosis not present

## 2017-12-18 DIAGNOSIS — G44309 Post-traumatic headache, unspecified, not intractable: Secondary | ICD-10-CM | POA: Diagnosis not present

## 2017-12-18 DIAGNOSIS — Z6829 Body mass index (BMI) 29.0-29.9, adult: Secondary | ICD-10-CM | POA: Diagnosis not present

## 2017-12-18 DIAGNOSIS — M542 Cervicalgia: Secondary | ICD-10-CM | POA: Diagnosis not present

## 2017-12-25 ENCOUNTER — Encounter: Payer: Self-pay | Admitting: Internal Medicine

## 2017-12-29 ENCOUNTER — Ambulatory Visit (INDEPENDENT_AMBULATORY_CARE_PROVIDER_SITE_OTHER): Payer: Medicare Other | Admitting: Internal Medicine

## 2017-12-29 ENCOUNTER — Encounter: Payer: Self-pay | Admitting: Internal Medicine

## 2017-12-29 VITALS — BP 142/78 | HR 59 | Temp 98.2°F | Resp 14 | Ht 67.0 in | Wt 191.1 lb

## 2017-12-29 DIAGNOSIS — I10 Essential (primary) hypertension: Secondary | ICD-10-CM

## 2017-12-29 DIAGNOSIS — R519 Headache, unspecified: Secondary | ICD-10-CM

## 2017-12-29 DIAGNOSIS — R51 Headache: Secondary | ICD-10-CM

## 2017-12-29 LAB — HIGH SENSITIVITY CRP: CRP, High Sensitivity: 6.45 mg/L — ABNORMAL HIGH (ref 0.000–5.000)

## 2017-12-29 LAB — SEDIMENTATION RATE: Sed Rate: 4 mm/hr (ref 0–20)

## 2017-12-29 MED ORDER — NORTRIPTYLINE HCL 25 MG PO CAPS: 50.0000 mg | ORAL_CAPSULE | Freq: Every day | ORAL | 3 refills | Status: DC
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
## 2017-12-29 NOTE — Patient Instructions (Addendum)
GO TO THE LAB : Get the blood work     GO TO THE FRONT DESK Schedule your next appointment for a checkup in 2 months  Pamelor 25 mg: 2 tablets at bedtime   hydrochlorothiazide 12.5 mg: Take 2 tablets daily  If that improve your blood pressure ( 110/65 and  135/85) let me know, will make the change permanent.  If that is the case we will check blood in 2 weeks

## 2017-12-29 NOTE — Progress Notes (Signed)
Pre visit review using our clinic review tool, if applicable. No additional management support is needed unless otherwise documented below in the visit note. 

## 2017-12-30 NOTE — Assessment & Plan Note (Signed)
Records from the [place]:  Labs 11/21/2017: Cholesterol 199, LDL 107, A1c 6.4, CBC, CMP, TSH essentially normal.   MRI neck 12/04/2017: Severe DJD, spinal stenosis C5-6 and C6-C7 HA:As described at the HPI, already saw neurology, they recommended Pamelor and to start using a CPAP (patient doubt he will be able to).  Suspect this is a tensional HA; rec  to increase Pamelor from 20 to 40 mg.  Will check sed rate and CRP to be complete; chronic aches and pains @ baseline, low suspicion for TA HTN: Readings are moderately high, change HCTZ from 12.5 to 25 mg daily call with readings in 10 days, if that is working we will get a BMP RTC 2 months

## 2018-01-06 ENCOUNTER — Telehealth: Payer: Self-pay | Admitting: Internal Medicine

## 2018-01-06 NOTE — Telephone Encounter (Signed)
Spoke with [name] regarding AWV. Pt declined to schedule appointment at this time. SF

## 2018-01-14 ENCOUNTER — Encounter: Payer: Self-pay | Admitting: Internal Medicine

## 2018-01-27 ENCOUNTER — Ambulatory Visit (INDEPENDENT_AMBULATORY_CARE_PROVIDER_SITE_OTHER): Payer: Medicare Other | Admitting: Internal Medicine

## 2018-01-27 VITALS — BP 126/78 | HR 66 | Temp 97.9°F | Resp 14 | Ht 67.0 in | Wt 190.2 lb

## 2018-01-27 DIAGNOSIS — R519 Headache, unspecified: Secondary | ICD-10-CM

## 2018-01-27 DIAGNOSIS — R51 Headache: Secondary | ICD-10-CM

## 2018-01-27 DIAGNOSIS — I10 Essential (primary) hypertension: Secondary | ICD-10-CM

## 2018-01-27 LAB — BASIC METABOLIC PANEL
BUN: 22 mg/dL (ref 6–23)
CALCIUM: 9.7 mg/dL (ref 8.4–10.5)
CO2: 33 meq/L — AB (ref 19–32)
CREATININE: 1.01 mg/dL (ref 0.40–1.50)
Chloride: 99 mEq/L (ref 96–112)
GFR: 76.19 mL/min (ref >60.00)
GLUCOSE: 107 mg/dL — AB (ref 70–99)
Potassium: 4.5 mEq/L (ref 3.5–5.1)
Sodium: 137 mEq/L (ref 135–145)

## 2018-01-27 MED ORDER — HYDROCHLOROTHIAZIDE 25 MG PO TABS: 25.0000 mg | ORAL_TABLET | Freq: Every day | ORAL | 3 refills | Status: DC

## 2018-01-27 MED ORDER — HYDROCHLOROTHIAZIDE 12.5 MG PO CAPS: 12.5000 mg | ORAL_CAPSULE | Freq: Every day | ORAL | 3 refills | Status: DC

## 2018-01-27 NOTE — Progress Notes (Signed)
Pre visit review using our clinic review tool, if applicable. No additional management support is needed unless otherwise documented below in the visit note. 

## 2018-01-27 NOTE — Assessment & Plan Note (Signed)
Preventive care discussed HTN: Under better control with increase of HCTZ to 25 mg daily, he is also on carvedilol.  Prescription for HCTZ 25 mg daily sent, check a BMP HAs: Still an issue despite increasing Pamelor to 3 tablets daily per neurologist at the [place].  To see our local neurologist tomorrow OSA: Felt to be a factor causing headaches per the neurologist at the [facility], has not been able to start using a CPAP due to sinus congestion. Other problems are stable. RTC 6-8 months
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
## 2018-01-27 NOTE — Assessment & Plan Note (Signed)
-  Tdap 2012; pneumonia shot 03-2009; prevnar:2015;shingles shot: 2008, had a flu shot . shingrex not available -CCS: h/o  several Cscopes due to polyps, Cscope  12-07, TICS , no polyp; Cscope 09-2011: 3 polyps, 5 years . Due for a cscope : plans to proceed, currently dealing w/ other issues (HAs) - Prostate ca screening, +FH uncle: [facility]:  0.9

## 2018-01-27 NOTE — Patient Instructions (Addendum)
Go to the lab   [facility] Schedule your next appointment for a  Check up in 6-8 months     Check the  blood pressure   daily Be sure your blood pressure is between 110/65 and  135/85. If it is consistently higher or lower, let me know

## 2018-01-28 ENCOUNTER — Ambulatory Visit (INDEPENDENT_AMBULATORY_CARE_PROVIDER_SITE_OTHER): Payer: Medicare Other | Admitting: Neurology

## 2018-01-28 ENCOUNTER — Encounter: Payer: Self-pay | Admitting: Neurology

## 2018-01-28 VITALS — BP 182/80 | HR 65 | Ht 67.0 in | Wt 190.0 lb

## 2018-01-28 DIAGNOSIS — G44221 Chronic tension-type headache, intractable: Secondary | ICD-10-CM
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
## 2018-01-28 NOTE — Patient Instructions (Signed)
1.  Continue nortriptyline 75mg  at bedtime for now.  When you have about 3 days left, contact me with update.  If headaches are still constant, I will increase dose to 100mg  at bedtime. 2.  Follow up in 3 months.  We will make changes in medication management in between visits.

## 2018-02-07 ENCOUNTER — Other Ambulatory Visit: Payer: Self-pay | Admitting: Internal Medicine

## 2018-04-11 ENCOUNTER — Other Ambulatory Visit: Payer: Self-pay | Admitting: Internal Medicine

## 2018-04-23 ENCOUNTER — Encounter: Payer: Self-pay | Admitting: Neurology

## 2018-05-08 ENCOUNTER — Encounter: Payer: Self-pay | Admitting: Neurology

## 2018-05-08 ENCOUNTER — Ambulatory Visit (INDEPENDENT_AMBULATORY_CARE_PROVIDER_SITE_OTHER): Payer: Medicare Other | Admitting: Neurology

## 2018-05-08 VITALS — BP 170/80 | HR 70 | Ht 67.0 in | Wt 189.0 lb

## 2018-05-08 DIAGNOSIS — I10 Essential (primary) hypertension: Secondary | ICD-10-CM

## 2018-05-08 DIAGNOSIS — G44221 Chronic tension-type headache, intractable: Secondary | ICD-10-CM

## 2018-05-08 MED ORDER — NORTRIPTYLINE HCL 75 MG PO CAPS: 75.0000 mg | ORAL_CAPSULE | Freq: Every day | ORAL | 2 refills | Status: DC

## 2018-05-08 MED FILL — NORTRIPTYLINE HCL 75 MG CAP: 75 | 30 days supply | Qty: 30 | Fill #0

## 2018-05-08 NOTE — Patient Instructions (Signed)
1.  I increased nortriptyline to 75mg  at bedtime.  If headaches improved in 4 weeks, refill it.  If headaches not improved in 4 weeks, contact me and I will increase dose to 100mg  at bedtime. 2.  Limit use of pain relievers to no more than 2 days out of week to prevent rebound headache 3.  Keep headache diary 4.  Follow up in 3 months.
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
## 2018-05-12 ENCOUNTER — Encounter: Payer: Self-pay | Admitting: Cardiovascular Disease

## 2018-05-12 ENCOUNTER — Ambulatory Visit (INDEPENDENT_AMBULATORY_CARE_PROVIDER_SITE_OTHER): Payer: Medicare Other | Admitting: Cardiovascular Disease

## 2018-05-12 VITALS — BP 168/72 | HR 54 | Ht 66.5 in | Wt 189.2 lb

## 2018-05-12 DIAGNOSIS — I10 Essential (primary) hypertension: Secondary | ICD-10-CM | POA: Diagnosis not present

## 2018-05-12 DIAGNOSIS — I701 Atherosclerosis of renal artery: Secondary | ICD-10-CM

## 2018-05-12 DIAGNOSIS — E7800 Pure hypercholesterolemia, unspecified: Secondary | ICD-10-CM | POA: Diagnosis not present

## 2018-05-12 NOTE — Patient Instructions (Signed)
Medication Instructions: Your physician recommends that you continue on your current medications as directed. Please refer to the Current Medication list given to you today.  If you need a refill on your cardiac medications before your next appointment, please call your pharmacy.   Labwork: None  Procedures/Testing: Your physician has requested that you have a renal artery duplex. During this test, an ultrasound is used to evaluate blood flow to the kidneys. Allow one hour for this exam. Do not eat after midnight the day before and avoid carbonated beverages. Take your medications as you usually do. This will take place at [address], [address].   Follow-Up: Your physician wants you to follow-up in 12 months with Dr. Fletcher Anon. You will receive a reminder letter in the mail two months in advance. If you don't receive a letter, please call our office at [phone] to schedule this follow-up appointment.   Thank you for choosing [facility] at [facility]!!
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
## 2018-05-22 ENCOUNTER — Ambulatory Visit (HOSPITAL_COMMUNITY)
Admission: RE | Admit: 2018-05-22 | Discharge: 2018-05-22 | Disposition: A | Discharge status: Home / Self Care | Payer: Medicare Other | Source: Ambulatory Visit | Attending: Cardiovascular Disease | Admitting: Cardiovascular Disease

## 2018-05-22 DIAGNOSIS — I701 Atherosclerosis of renal artery: Secondary | ICD-10-CM | POA: Diagnosis not present

## 2018-05-22 DIAGNOSIS — Z87891 Personal history of nicotine dependence: Secondary | ICD-10-CM | POA: Diagnosis not present

## 2018-05-22 DIAGNOSIS — I10 Essential (primary) hypertension: Secondary | ICD-10-CM | POA: Diagnosis not present

## 2018-05-22 DIAGNOSIS — E785 Hyperlipidemia, unspecified: Secondary | ICD-10-CM | POA: Insufficient documentation

## 2018-05-28 ENCOUNTER — Telehealth: Payer: Self-pay | Admitting: Cardiovascular Disease

## 2018-05-28 NOTE — Telephone Encounter (Signed)
lFollow Up:    Returning your call, we were disconnected.

## 2018-05-28 NOTE — Telephone Encounter (Signed)
Left a message to call back for results.  Notes recorded by Wellington Hampshire, MD on 05/27/2018 at 5:10 PM EDT Doppler showed only mild renal artery stenosis. Continue same medications.

## 2018-05-28 NOTE — Telephone Encounter (Signed)
Patient made aware of results and verbalized his understanding.

## 2018-05-28 NOTE — Telephone Encounter (Signed)
Follow up ° °Pt returning call for nurse °

## 2018-06-09 ENCOUNTER — Encounter: Payer: Self-pay | Admitting: Internal Medicine

## 2018-06-10 MED ORDER — VALACYCLOVIR HCL 1 G PO TABS: ORAL_TABLET | ORAL | 1 refills | Status: DC

## 2018-06-10 MED FILL — valACYclovir HCL 1 GM TABS: 1 | 8 days supply | Qty: 30 | Fill #0

## 2018-06-23 DIAGNOSIS — M4727 Other spondylosis with radiculopathy, lumbosacral region: Secondary | ICD-10-CM | POA: Diagnosis not present

## 2018-06-23 DIAGNOSIS — M4726 Other spondylosis with radiculopathy, lumbar region: Secondary | ICD-10-CM | POA: Diagnosis not present

## 2018-06-23 DIAGNOSIS — M48061 Spinal stenosis, lumbar region without neurogenic claudication: Secondary | ICD-10-CM | POA: Diagnosis not present

## 2018-06-23 DIAGNOSIS — M5136 Other intervertebral disc degeneration, lumbar region: Secondary | ICD-10-CM | POA: Diagnosis not present

## 2018-06-23 DIAGNOSIS — M47816 Spondylosis without myelopathy or radiculopathy, lumbar region: Secondary | ICD-10-CM | POA: Diagnosis not present

## 2018-06-23 DIAGNOSIS — M4316 Spondylolisthesis, lumbar region: Secondary | ICD-10-CM | POA: Diagnosis not present

## 2018-06-23 DIAGNOSIS — Z981 Arthrodesis status: Secondary | ICD-10-CM | POA: Diagnosis not present

## 2018-06-23 DIAGNOSIS — M5117 Intervertebral disc disorders with radiculopathy, lumbosacral region: Secondary | ICD-10-CM | POA: Diagnosis not present

## 2018-06-23 DIAGNOSIS — M5116 Intervertebral disc disorders with radiculopathy, lumbar region: Secondary | ICD-10-CM | POA: Diagnosis not present

## 2018-06-23 DIAGNOSIS — M5030 Other cervical disc degeneration, unspecified cervical region: Secondary | ICD-10-CM | POA: Diagnosis not present

## 2018-06-23 DIAGNOSIS — I10 Essential (primary) hypertension: Secondary | ICD-10-CM | POA: Diagnosis not present

## 2018-06-23 MED FILL — predniSONE 10 MG TABS: 10 | 6 days supply | Qty: 21 | Fill #0

## 2018-07-02 DIAGNOSIS — M4120 Other idiopathic scoliosis, site unspecified: Secondary | ICD-10-CM | POA: Diagnosis not present

## 2018-07-02 DIAGNOSIS — I10 Essential (primary) hypertension: Secondary | ICD-10-CM | POA: Diagnosis not present

## 2018-07-02 DIAGNOSIS — Z6829 Body mass index (BMI) 29.0-29.9, adult: Secondary | ICD-10-CM | POA: Diagnosis not present

## 2018-07-02 DIAGNOSIS — G44309 Post-traumatic headache, unspecified, not intractable: Secondary | ICD-10-CM | POA: Diagnosis not present

## 2018-07-02 DIAGNOSIS — M4316 Spondylolisthesis, lumbar region: Secondary | ICD-10-CM | POA: Diagnosis not present

## 2018-07-24 ENCOUNTER — Encounter (HOSPITAL_BASED_OUTPATIENT_CLINIC_OR_DEPARTMENT_OTHER): Payer: Self-pay | Admitting: Emergency Medicine

## 2018-07-24 ENCOUNTER — Emergency Department (HOSPITAL_BASED_OUTPATIENT_CLINIC_OR_DEPARTMENT_OTHER)
Admission: EM | Admit: 2018-07-24 | Discharge: 2018-07-24 | Disposition: A | Discharge status: Home / Self Care | Payer: Medicare Other | Attending: Emergency Medicine | Admitting: Emergency Medicine

## 2018-07-24 ENCOUNTER — Emergency Department (HOSPITAL_BASED_OUTPATIENT_CLINIC_OR_DEPARTMENT_OTHER): Payer: Medicare Other

## 2018-07-24 ENCOUNTER — Other Ambulatory Visit: Payer: Self-pay

## 2018-07-24 DIAGNOSIS — R0781 Pleurodynia: Secondary | ICD-10-CM | POA: Insufficient documentation

## 2018-07-24 DIAGNOSIS — Y9389 Activity, other specified: Secondary | ICD-10-CM | POA: Diagnosis not present

## 2018-07-24 DIAGNOSIS — I10 Essential (primary) hypertension: Secondary | ICD-10-CM | POA: Insufficient documentation

## 2018-07-24 DIAGNOSIS — Z79899 Other long term (current) drug therapy: Secondary | ICD-10-CM | POA: Insufficient documentation

## 2018-07-24 DIAGNOSIS — Z87891 Personal history of nicotine dependence: Secondary | ICD-10-CM | POA: Insufficient documentation

## 2018-07-24 DIAGNOSIS — S299XXA Unspecified injury of thorax, initial encounter: Secondary | ICD-10-CM | POA: Diagnosis not present

## 2018-07-24 DIAGNOSIS — Y929 Unspecified place or not applicable: Secondary | ICD-10-CM | POA: Insufficient documentation

## 2018-07-24 DIAGNOSIS — Y999 Unspecified external cause status: Secondary | ICD-10-CM | POA: Diagnosis not present

## 2018-07-24 DIAGNOSIS — W01198A Fall on same level from slipping, tripping and stumbling with subsequent striking against other object, initial encounter: Secondary | ICD-10-CM | POA: Insufficient documentation

## 2018-07-24 DIAGNOSIS — G309 Alzheimer's disease, unspecified: Secondary | ICD-10-CM | POA: Diagnosis not present

## 2018-07-24 MED ORDER — TRAMADOL HCL 50 MG PO TABS: 50.0000 mg | ORAL_TABLET | Freq: Four times a day (QID) | ORAL | 0 refills | Status: DC | PRN

## 2018-07-24 MED FILL — traMADol HCL 50 MG TABS: 50 | 4 days supply | Qty: 15 | Fill #0

## 2018-07-24 NOTE — Discharge Instructions (Signed)
Your Chest x ray did not show any rib fractures. You most likely bruised your ribs with your injury. Can use ice 3-4 times a day to the affected area. Place a towel in between skin and ice. May use a pillow to brace your ribs if you need to cough. Please follow up with primary care or return to the ED if symptoms do not resolve or worsen. Do not drive with medication prescribed.  Contact a health care provider if: You have increased bruising or swelling. You have pain that is not controlled with treatment. You have a fever. Get help right away if: You have difficulty breathing or shortness of breath. You develop a continual cough, or you cough up thick or bloody sputum. You feel sick to your stomach (nauseous), you throw up (vomit), or you have abdominal pain.
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
## 2018-07-24 NOTE — ED Triage Notes (Signed)
States fell against a trash container x 2 days ago and has pain to left ribs. No LOC

## 2018-07-30 ENCOUNTER — Ambulatory Visit: Payer: Medicare Other | Admitting: Internal Medicine

## 2018-08-08 ENCOUNTER — Other Ambulatory Visit: Payer: Self-pay | Admitting: Internal Medicine

## 2018-08-10 ENCOUNTER — Encounter: Payer: Self-pay | Admitting: Internal Medicine

## 2018-08-12 ENCOUNTER — Encounter: Payer: Self-pay | Admitting: Internal Medicine

## 2018-08-12 ENCOUNTER — Ambulatory Visit (INDEPENDENT_AMBULATORY_CARE_PROVIDER_SITE_OTHER): Payer: Medicare Other | Admitting: Internal Medicine

## 2018-08-12 VITALS — BP 130/74 | HR 56 | Temp 97.8°F | Resp 16 | Ht 66.5 in | Wt 186.4 lb

## 2018-08-12 DIAGNOSIS — I10 Essential (primary) hypertension: Secondary | ICD-10-CM | POA: Diagnosis not present

## 2018-08-12 DIAGNOSIS — I701 Atherosclerosis of renal artery: Secondary | ICD-10-CM

## 2018-08-12 DIAGNOSIS — E7800 Pure hypercholesterolemia, unspecified: Secondary | ICD-10-CM | POA: Diagnosis not present

## 2018-08-12 DIAGNOSIS — R519 Headache, unspecified: Secondary | ICD-10-CM

## 2018-08-12 DIAGNOSIS — R739 Hyperglycemia, unspecified: Secondary | ICD-10-CM | POA: Diagnosis not present

## 2018-08-12 DIAGNOSIS — R1310 Dysphagia, unspecified: Secondary | ICD-10-CM

## 2018-08-12 DIAGNOSIS — R51 Headache: Secondary | ICD-10-CM

## 2018-08-12 DIAGNOSIS — K219 Gastro-esophageal reflux disease without esophagitis: Secondary | ICD-10-CM

## 2018-08-12 LAB — BASIC METABOLIC PANEL
BUN: 17 mg/dL (ref 6–23)
CHLORIDE: 103 meq/L (ref 96–112)
CO2: 29 meq/L (ref 19–32)
CREATININE: 0.96 mg/dL (ref 0.40–1.50)
Calcium: 9.7 mg/dL (ref 8.4–10.5)
GFR: 80.68 mL/min (ref >60.00)
GLUCOSE: 106 mg/dL — AB (ref 70–99)
Potassium: 4.4 mEq/L (ref 3.5–5.1)
Sodium: 140 mEq/L (ref 135–145)

## 2018-08-12 LAB — HEMOGLOBIN A1C: Hgb A1c MFr Bld: 6.4 % (ref 4.6–6.5)

## 2018-08-12 MED ORDER — ZOSTER VAC RECOMB ADJUVANTED 50 MCG/0.5ML IM SUSR: 0.5000 mL | Freq: Once | INTRAMUSCULAR | 1 refills | Status: AC
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
## 2018-08-12 NOTE — Patient Instructions (Signed)
GO TO THE LAB : Get the blood work     GO TO THE FRONT DESK Schedule your next appointment for a   checkup in 6 months 

## 2018-08-12 NOTE — Progress Notes (Signed)
Pre visit review using our clinic review tool, if applicable. No additional management support is needed unless otherwise documented below in the visit note. 

## 2018-08-12 NOTE — Assessment & Plan Note (Signed)
Pre-diabetes: Check a A1c.  Diet controlled HTN: Seems controlled on carvedilol, HCTZ, Micardis.  Ambulatory BPs range from 114-109. check a BMP. Headaches: Much improved, saw neurology, note reviewed, continue Pamelor Hyperlipidemia: Last LDL satisfactory, on Zetia. Renal artery stenosis: Chart reviewed, saw cardiology recently, ultrasound 05/2018 "mild RAS" GERD, chronic dysphagia: At baseline Falls x2: They were accidental falls, denies any dizziness, LOC, prevention discussed, declined PT referral. Lead poisoning?  He already contact his [place] doctor, encouraged to discuss with them further steps. Preventive care: Already scheduled to have a colonoscopy.  Shingrix benefits and side effects discussed, Rx printed. RTC 6 months F2F > 25
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
## 2018-08-26 ENCOUNTER — Encounter: Payer: Self-pay | Admitting: Neurology

## 2018-08-26 ENCOUNTER — Ambulatory Visit (INDEPENDENT_AMBULATORY_CARE_PROVIDER_SITE_OTHER): Payer: Medicare Other | Admitting: Neurology

## 2018-08-26 VITALS — BP 139/80 | HR 63 | Ht 67.0 in | Wt 187.0 lb

## 2018-08-26 DIAGNOSIS — I701 Atherosclerosis of renal artery: Secondary | ICD-10-CM

## 2018-08-26 DIAGNOSIS — G44221 Chronic tension-type headache, intractable: Secondary | ICD-10-CM

## 2018-08-27 ENCOUNTER — Other Ambulatory Visit: Payer: Self-pay | Admitting: Cardiovascular Disease

## 2018-09-01 MED FILL — SHINGRIX 50 MCG SUS: 50 | 1 days supply | Qty: 1 | Fill #0

## 2018-09-03 DIAGNOSIS — T560X4A Toxic effect of lead and its compounds, undetermined, initial encounter: Secondary | ICD-10-CM | POA: Diagnosis not present

## 2018-09-03 MED FILL — LIDOCAINE PATCH 5%: 5 | 30 days supply | Qty: 30 | Fill #0

## 2018-09-14 DIAGNOSIS — H353132 Nonexudative age-related macular degeneration, bilateral, intermediate dry stage: Secondary | ICD-10-CM | POA: Diagnosis not present

## 2018-09-14 DIAGNOSIS — Z961 Presence of intraocular lens: Secondary | ICD-10-CM | POA: Diagnosis not present

## 2018-09-15 ENCOUNTER — Telehealth: Payer: Self-pay | Admitting: *Deleted

## 2018-09-15 NOTE — Telephone Encounter (Signed)
John,   Can you review this pt's chart.  He is scheduled for a colon with Henrene Pastor 10-16 Wednesday  He had his last procedure in the [facility] ( EGD) 03-1514 with [facility]  Dr Henrene Pastor made this note at a 11-21-14     ---- . Difficult intubation       potential for although no personal history-has titanium bridge in his neck   I just wanna make sure he is Ok for the [facility] - there is no documentation of diff intubation in epic chart   Thanks, Lelan Pons

## 2018-09-15 NOTE — Telephone Encounter (Signed)
[name],  This pt is cleared for anesthetic care at [facility].   Thanks,  [name] 

## 2018-09-23 ENCOUNTER — Ambulatory Visit (AMBULATORY_SURGERY_CENTER): Payer: Self-pay | Admitting: *Deleted

## 2018-09-23 VITALS — Ht 67.0 in | Wt 186.0 lb

## 2018-09-23 DIAGNOSIS — Z86010 Personal history of colonic polyps: Secondary | ICD-10-CM

## 2018-09-23 MED ORDER — NA SULFATE-K SULFATE-MG SULF 17.5-3.13-1.6 GM/177ML PO SOLN: 1.0000 | Freq: Once | ORAL | 0 refills | Status: AC

## 2018-09-23 MED FILL — SUPREP BOWEL PREP KIT: 17.5-3.13-1 | 1 days supply | Qty: 354 | Fill #0

## 2018-09-23 NOTE — Progress Notes (Signed)
No egg or soy allergy known to patient  No issues with past sedation with any surgeries  or procedures, no intubation problems  No diet pills per patient No home 02 use per patient  No blood thinners per patient  Pt denies issues with constipation  No A fib or A flutter  EMMI video sent to pt's e mail pt declined   

## 2018-10-01 ENCOUNTER — Encounter: Payer: Self-pay | Admitting: Internal Medicine

## 2018-10-02 ENCOUNTER — Encounter: Payer: Self-pay | Admitting: Internal Medicine

## 2018-10-02 DIAGNOSIS — Z23 Encounter for immunization: Secondary | ICD-10-CM | POA: Diagnosis not present

## 2018-10-07 ENCOUNTER — Ambulatory Visit (AMBULATORY_SURGERY_CENTER): Payer: Medicare Other | Admitting: Internal Medicine

## 2018-10-07 ENCOUNTER — Encounter: Payer: Self-pay | Admitting: Internal Medicine

## 2018-10-07 VITALS — BP 152/78 | HR 58 | Temp 97.1°F | Resp 12 | Ht 67.0 in | Wt 187.0 lb

## 2018-10-07 DIAGNOSIS — Z86010 Personal history of colonic polyps: Secondary | ICD-10-CM | POA: Diagnosis not present

## 2018-10-07 DIAGNOSIS — D122 Benign neoplasm of ascending colon: Secondary | ICD-10-CM

## 2018-10-07 DIAGNOSIS — K635 Polyp of colon: Secondary | ICD-10-CM

## 2018-10-07 DIAGNOSIS — D125 Benign neoplasm of sigmoid colon: Secondary | ICD-10-CM | POA: Diagnosis not present

## 2018-10-07 DIAGNOSIS — D123 Benign neoplasm of transverse colon: Secondary | ICD-10-CM | POA: Diagnosis not present

## 2018-10-07 DIAGNOSIS — Z1211 Encounter for screening for malignant neoplasm of colon: Secondary | ICD-10-CM | POA: Diagnosis not present

## 2018-10-07 MED ORDER — SODIUM CHLORIDE 0.9 % IV SOLN: 500.0000 mL | Freq: Once | INTRAVENOUS | Status: DC

## 2018-10-07 NOTE — Progress Notes (Signed)
Called to room to assist during endoscopic procedure.  Patient ID and intended procedure confirmed with present staff. Received instructions for my participation in the procedure from the performing physician.  

## 2018-10-07 NOTE — Op Note (Signed)
[facility] Patient Name: [name] Procedure Date: 10/07/2018 8:45 AM MRN: [id number] Endoscopist: Docia Chuck. Henrene Pastor , MD Age: 77 Referring MD:  Date of Birth: 1941/06/24 Gender: Male Account #: [id number] Procedure:                Colonoscopy with cold snare polypectomy x 4 Indications:              High risk colon cancer surveillance: Personal                            history of adenoma (10 mm or greater in size), High                            risk colon cancer surveillance: Personal history of                            adenoma with villous component, High risk colon                            cancer surveillance: Personal history of multiple                            (3 or more) adenomas. Previous examinations 2002,                            2003, 2005, 2007, 2012 Medicines:                Monitored Anesthesia Care Procedure:                Pre-Anesthesia Assessment:                           - Prior to the procedure, a History and Physical                            was performed, and patient medications and                            allergies were reviewed. The patient's tolerance of                            previous anesthesia was also reviewed. The risks                            and benefits of the procedure and the sedation                            options and risks were discussed with the patient.                            All questions were answered, and informed consent                            was obtained. Prior Anticoagulants: The patient has  taken no previous anticoagulant or antiplatelet                            agents. ASA Grade Assessment: II - A patient with                            mild systemic disease. After reviewing the risks                            and benefits, the patient was deemed in                            satisfactory condition to undergo the procedure.                           After  obtaining informed consent, the colonoscope                            was passed under direct vision. Throughout the                            procedure, the patient's blood pressure, pulse, and                            oxygen saturations were monitored continuously. The                            Colonoscope was introduced through the anus and                            advanced to the the cecum, identified by                            appendiceal orifice and ileocecal valve. The                            ileocecal valve, appendiceal orifice, and rectum                            were photographed. The quality of the bowel                            preparation was excellent. The colonoscopy was                            performed without difficulty. The patient tolerated                            the procedure well. The bowel preparation used was                            SUPREP. Scope In: 8:57:56 AM Scope Out: 9:15:00 AM Scope Withdrawal Time: 0 hours 15 minutes 23 seconds  Total Procedure Duration: 0 hours 17 minutes 4  seconds  Findings:                 Four polyps were found in the sigmoid colon,                            transverse colon and ascending colon. The polyps                            were 3 to 10 mm in size. These polyps were removed                            with a cold snare. Resection and retrieval were                            complete.                           The exam was otherwise without abnormality on                            direct and retroflexion views. Complications:            No immediate complications. Estimated blood loss:                            None. Estimated Blood Loss:     Estimated blood loss: none. Impression:               - Four 3 to 10 mm polyps in the sigmoid colon, in                            the transverse colon and in the ascending colon,                            removed with a cold snare. Resected and retrieved.                            - The examination was otherwise normal on direct                            and retroflexion views. Recommendation:           - Repeat colonoscopy in 3 years for surveillance if                            medically appropriate.                           - Patient has a contact number available for                            emergencies. The signs and symptoms of potential                            delayed complications were discussed with the  patient. Return to normal activities tomorrow.                            Written discharge instructions were provided to the                            patient.                           - Resume previous diet.                           - Continue present medications.                           - Await pathology results. Docia Chuck. Henrene Pastor, MD 10/07/2018 9:21:12 AM This report has been signed electronically.

## 2018-10-07 NOTE — Patient Instructions (Signed)
YOU HAD AN ENDOSCOPIC PROCEDURE TODAY AT THE [facility]:   Refer to the procedure report that was given to you for any specific questions about what was found during the examination.  If the procedure report does not answer your questions, please call your gastroenterologist to clarify.  If you requested that your care partner not be given the details of your procedure findings, then the procedure report has been included in a sealed envelope for you to review at your convenience later.  YOU SHOULD EXPECT: Some feelings of bloating in the abdomen. Passage of more gas than usual.  Walking can help get rid of the air that was put into your GI tract during the procedure and reduce the bloating. If you had a lower endoscopy (such as a colonoscopy or flexible sigmoidoscopy) you may notice spotting of blood in your stool or on the toilet paper. If you underwent a bowel prep for your procedure, you may not have a normal bowel movement for a few days.  Please Note:  You might notice some irritation and congestion in your nose or some drainage.  This is from the oxygen used during your procedure.  There is no need for concern and it should clear up in a day or so.  SYMPTOMS TO REPORT IMMEDIATELY:   Following lower endoscopy (colonoscopy or flexible sigmoidoscopy):  Excessive amounts of blood in the stool  Significant tenderness or worsening of abdominal pains  Swelling of the abdomen that is new, acute  Fever of 100F or higher   For urgent or emergent issues, a gastroenterologist can be reached at any hour by calling [phone].   DIET:  We do recommend a small meal at first, but then you may proceed to your regular diet.  Drink plenty of fluids but you should avoid alcoholic beverages for 24 hours.  ACTIVITY:  You should plan to take it easy for the rest of today and you should NOT DRIVE or use heavy machinery until tomorrow (because of the sedation medicines used during the test).     FOLLOW UP: Our staff will call the number listed on your records the next business day following your procedure to check on you and address any questions or concerns that you may have regarding the information given to you following your procedure. If we do not reach you, we will leave a message.  However, if you are feeling well and you are not experiencing any problems, there is no need to return our call.  We will assume that you have returned to your regular daily activities without incident.  If any biopsies were taken you will be contacted by phone or by letter within the next 1-3 weeks.  Please call us at [phone] if you have not heard about the biopsies in 3 weeks.    SIGNATURES/CONFIDENTIALITY: You and/or your care partner have signed paperwork which will be entered into your electronic medical record.  These signatures attest to the fact that that the information above on your After Visit Summary has been reviewed and is understood.  Full responsibility of the confidentiality of this discharge information lies with you and/or your care-partner.  Polyp information given. 

## 2018-10-07 NOTE — Progress Notes (Signed)
Pt's states no medical or surgical changes since previsit or office visit. 

## 2018-10-07 NOTE — Progress Notes (Signed)
A and O x3. Report to RN. Tolerated MAC anesthesia well.

## 2018-10-08 ENCOUNTER — Telehealth: Payer: Self-pay

## 2018-10-08 NOTE — Telephone Encounter (Signed)
  Follow up Call-  Call back number 10/07/2018  Post procedure Call Back phone  # [phone]  Permission to leave phone message Yes  Some recent data might be hidden     Left Message

## 2018-10-08 NOTE — Telephone Encounter (Signed)
  Follow up Call-  Call [name] number 10/07/2018  Post procedure Call [name] phone  # [phone]  Permission to leave phone message Yes  Some recent data might be hidden     Patient questions:  Do you have a fever, pain , or abdominal swelling? No. Pain Score  0 *  Have you tolerated food without any problems? Yes.    Have you been able to return to your normal activities? Yes.    Do you have any questions about your discharge instructions: Diet   No. Medications  No. Follow up visit  No.  Do you have questions or concerns about your Care? No.  Actions: * If pain score is 4 or above: No action needed, pain <4.

## 2018-10-10 ENCOUNTER — Other Ambulatory Visit: Payer: Self-pay | Admitting: Internal Medicine

## 2018-10-12 ENCOUNTER — Encounter: Payer: Self-pay | Admitting: Internal Medicine

## 2018-10-13 DIAGNOSIS — R51 Headache: Secondary | ICD-10-CM | POA: Diagnosis not present

## 2018-10-13 DIAGNOSIS — M1990 Unspecified osteoarthritis, unspecified site: Secondary | ICD-10-CM | POA: Diagnosis not present

## 2018-10-13 DIAGNOSIS — Z77011 Contact with and (suspected) exposure to lead: Secondary | ICD-10-CM | POA: Diagnosis not present

## 2018-10-13 DIAGNOSIS — Z713 Dietary counseling and surveillance: Secondary | ICD-10-CM | POA: Diagnosis not present

## 2018-10-20 MED FILL — LIDOCAINE 5% PATCH: 5 | 30 days supply | Qty: 30 | Fill #0

## 2018-12-03 DIAGNOSIS — M25551 Pain in right hip: Secondary | ICD-10-CM | POA: Diagnosis not present

## 2018-12-03 DIAGNOSIS — M542 Cervicalgia: Secondary | ICD-10-CM | POA: Diagnosis not present

## 2018-12-03 DIAGNOSIS — M5440 Lumbago with sciatica, unspecified side: Secondary | ICD-10-CM | POA: Diagnosis not present

## 2018-12-28 MED FILL — valACYclovir HCL 1 GM TABS: 1 | 8 days supply | Qty: 30 | Fill #1

## 2018-12-30 ENCOUNTER — Encounter: Payer: Self-pay | Admitting: Internal Medicine

## 2019-01-04 ENCOUNTER — Ambulatory Visit: Payer: Medicare Other | Admitting: Neurology

## 2019-01-04 ENCOUNTER — Other Ambulatory Visit: Payer: Self-pay | Admitting: Internal Medicine
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
## 2019-01-20 ENCOUNTER — Ambulatory Visit (INDEPENDENT_AMBULATORY_CARE_PROVIDER_SITE_OTHER): Payer: Medicare Other | Admitting: Neurology

## 2019-01-20 ENCOUNTER — Encounter: Payer: Self-pay | Admitting: Neurology

## 2019-01-20 VITALS — BP 134/82 | HR 54 | Ht 67.0 in | Wt 188.0 lb

## 2019-01-20 DIAGNOSIS — G44221 Chronic tension-type headache, intractable: Secondary | ICD-10-CM

## 2019-01-20 DIAGNOSIS — I6529 Occlusion and stenosis of unspecified carotid artery: Secondary | ICD-10-CM

## 2019-01-20 MED ORDER — TOPIRAMATE 25 MG PO TABS: 25.0000 mg | ORAL_TABLET | Freq: Every day | ORAL | 1 refills | Status: DC

## 2019-01-20 MED FILL — TOPIRAMATE 25 MG TAB: 25 | 30 days supply | Qty: 30 | Fill #0

## 2019-01-20 NOTE — Patient Instructions (Addendum)
1.  We will start topiramate 25mg  at bedtime.  If headaches not improved in 4 weeks, contact me and we can increase dose. 2.  Use ibuprofen if needed.  Limit use of pain relievers to no more than 2 days out of week to prevent risk of rebound or medication-overuse headache. 3.  Keep headache diary 4.  Will check carotid ultrasound 5.  Follow up in 4 months.  We have sent a referral to [facility] for your carotid ultrasound and they will call you directly to schedule your appt. If you need to contact them directly please call [phone].

## 2019-01-25 ENCOUNTER — Telehealth: Payer: Self-pay | Admitting: Neurology

## 2019-01-25 NOTE — Telephone Encounter (Signed)
Patient calling in about carotid artery test that was supposed to be scheduled and he has not heard anything about. Please call him back. Thanks!

## 2019-01-25 NOTE — Telephone Encounter (Signed)
Calleded and advised Pt he can call the telephone number provided to him on his AVS to schedule the doppler

## 2019-01-27 ENCOUNTER — Ambulatory Visit
Admission: RE | Admit: 2019-01-27 | Discharge: 2019-01-27 | Disposition: A | Discharge status: Home / Self Care | Payer: Medicare Other | Source: Ambulatory Visit | Attending: Neurology | Admitting: Neurology

## 2019-01-27 DIAGNOSIS — I6523 Occlusion and stenosis of bilateral carotid arteries: Secondary | ICD-10-CM | POA: Diagnosis not present

## 2019-01-27 DIAGNOSIS — I6529 Occlusion and stenosis of unspecified carotid artery: Secondary | ICD-10-CM

## 2019-01-28 ENCOUNTER — Telehealth: Payer: Self-pay

## 2019-01-28 ENCOUNTER — Encounter: Payer: Self-pay | Admitting: Internal Medicine

## 2019-01-28 NOTE — Telephone Encounter (Signed)
Called and spoke with Pt, advised him of results and vascular recommendation. He is agreeable to seeing a Marine scientist.

## 2019-01-28 NOTE — Telephone Encounter (Signed)
-----   Message from Pieter Partridge, DO sent at 01/28/2019 12:22 PM EST ----- The carotid doppler shows plaque build up causing narrowing of both carotid arteries.  I would like him to be evaluated by vascular.

## 2019-02-02 DIAGNOSIS — I6523 Occlusion and stenosis of bilateral carotid arteries: Secondary | ICD-10-CM | POA: Diagnosis not present

## 2019-02-02 DIAGNOSIS — M7061 Trochanteric bursitis, right hip: Secondary | ICD-10-CM | POA: Diagnosis not present

## 2019-02-02 DIAGNOSIS — M7062 Trochanteric bursitis, left hip: Secondary | ICD-10-CM | POA: Diagnosis not present

## 2019-02-06 ENCOUNTER — Other Ambulatory Visit: Payer: Self-pay | Admitting: Internal Medicine

## 2019-02-12 ENCOUNTER — Ambulatory Visit: Payer: Medicare Other | Admitting: Internal Medicine

## 2019-02-18 DIAGNOSIS — Z8673 Personal history of transient ischemic attack (TIA), and cerebral infarction without residual deficits: Secondary | ICD-10-CM | POA: Diagnosis not present

## 2019-02-18 DIAGNOSIS — M47816 Spondylosis without myelopathy or radiculopathy, lumbar region: Secondary | ICD-10-CM | POA: Diagnosis not present

## 2019-02-18 DIAGNOSIS — M5136 Other intervertebral disc degeneration, lumbar region: Secondary | ICD-10-CM | POA: Diagnosis not present

## 2019-02-18 DIAGNOSIS — I10 Essential (primary) hypertension: Secondary | ICD-10-CM | POA: Diagnosis not present

## 2019-02-18 DIAGNOSIS — M7061 Trochanteric bursitis, right hip: Secondary | ICD-10-CM | POA: Diagnosis not present

## 2019-02-23 ENCOUNTER — Other Ambulatory Visit: Payer: Self-pay

## 2019-02-23 ENCOUNTER — Ambulatory Visit (INDEPENDENT_AMBULATORY_CARE_PROVIDER_SITE_OTHER): Payer: Medicare Other | Admitting: Vascular Surgery

## 2019-02-23 ENCOUNTER — Encounter: Payer: Self-pay | Admitting: Vascular Surgery

## 2019-02-23 DIAGNOSIS — I6529 Occlusion and stenosis of unspecified carotid artery: Secondary | ICD-10-CM

## 2019-02-23 DIAGNOSIS — I6523 Occlusion and stenosis of bilateral carotid arteries: Secondary | ICD-10-CM | POA: Diagnosis not present

## 2019-02-23 DIAGNOSIS — I739 Peripheral vascular disease, unspecified: Secondary | ICD-10-CM

## 2019-02-23 DIAGNOSIS — I779 Disorder of arteries and arterioles, unspecified: Secondary | ICD-10-CM | POA: Insufficient documentation

## 2019-02-23 MED FILL — SHINGRIX 50 MCG SUS: 50 | 1 days supply | Qty: 1 | Fill #1
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
## 2019-03-02 ENCOUNTER — Encounter: Payer: Medicare Other | Admitting: Vascular Surgery

## 2019-03-04 ENCOUNTER — Ambulatory Visit
Admission: RE | Admit: 2019-03-04 | Discharge: 2019-03-04 | Disposition: A | Discharge status: Home / Self Care | Payer: Medicare Other | Source: Ambulatory Visit | Attending: Vascular Surgery | Admitting: Vascular Surgery

## 2019-03-04 ENCOUNTER — Other Ambulatory Visit: Payer: Self-pay

## 2019-03-04 DIAGNOSIS — I6523 Occlusion and stenosis of bilateral carotid arteries: Secondary | ICD-10-CM | POA: Diagnosis not present

## 2019-03-04 DIAGNOSIS — I6529 Occlusion and stenosis of unspecified carotid artery: Secondary | ICD-10-CM

## 2019-03-04 MED ORDER — IOPAMIDOL (ISOVUE-370) INJECTION 76%
75.0000 mL | Freq: Once | INTRAVENOUS | Status: AC | PRN
  Administered 2019-03-04: 75 mL via INTRAVENOUS

## 2019-03-15 ENCOUNTER — Telehealth: Payer: Self-pay | Admitting: Vascular Surgery

## 2019-03-15 NOTE — Telephone Encounter (Signed)
Discussed with [name] on the phone the findings of his recent CTA neck.  Informed a 60% right internal carotid artery stenosis.  He remains asymptomatic and has had no neurologic symptoms, TIAs, or strokes in the last 6 months.  Discussed given asymptomatic disease we would follow this until greater than 80% unless he develops neurologic symptoms in which he should contact our office immediately.  I will have him follow-up in 6 months with repeat carotid duplex for ongoing surveillance.  [name], MD [facility] [place] Office: [phone] Pager: [place]

## 2019-03-16 ENCOUNTER — Encounter (HOSPITAL_COMMUNITY): Payer: Medicare Other

## 2019-03-16 ENCOUNTER — Encounter: Payer: Medicare Other | Admitting: Vascular Surgery

## 2019-04-26 ENCOUNTER — Encounter: Payer: Self-pay | Admitting: Internal Medicine

## 2019-05-04 ENCOUNTER — Ambulatory Visit (INDEPENDENT_AMBULATORY_CARE_PROVIDER_SITE_OTHER): Payer: Medicare Other | Admitting: Internal Medicine

## 2019-05-04 ENCOUNTER — Other Ambulatory Visit: Payer: Self-pay

## 2019-05-04 ENCOUNTER — Encounter: Payer: Self-pay | Admitting: Internal Medicine

## 2019-05-04 DIAGNOSIS — R5383 Other fatigue: Secondary | ICD-10-CM | POA: Diagnosis not present

## 2019-05-04 DIAGNOSIS — B009 Herpesviral infection, unspecified: Secondary | ICD-10-CM

## 2019-05-04 DIAGNOSIS — I10 Essential (primary) hypertension: Secondary | ICD-10-CM | POA: Diagnosis not present

## 2019-05-04 DIAGNOSIS — I6523 Occlusion and stenosis of bilateral carotid arteries: Secondary | ICD-10-CM | POA: Diagnosis not present

## 2019-05-04 MED ORDER — VALACYCLOVIR HCL 1 G PO TABS: ORAL_TABLET | ORAL | 1 refills | Status: DC

## 2019-05-04 MED FILL — valACYclovir HCL 1 GM TABS: 1 | 8 days supply | Qty: 30 | Fill #0
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
## 2019-05-05 NOTE — Assessment & Plan Note (Signed)
Carbon monoxide intoxication?  The symptoms of this intoxication are vague, his symptoms are also nonspecific entheses fatigue, insomnia, mild headache), if he was exposed to carbon monoxide the source has been removed already.  At this point, I do not see the need to do further testing, overall the patient said that he was doing very good.  Plans to get a carbon monoxide detector. HTN: Ambulatory BPs range from 97/64 to 137/76.  Continue carvedilol, HCTZ. Fever blisters: Has 1 at the lower lip, request a refill. COVID-19: Following good precautions, he did get a nasal swab recently: Negative. RTC 2 to 3 months routine visit

## 2019-05-13 ENCOUNTER — Telehealth: Payer: Self-pay

## 2019-05-13 NOTE — Telephone Encounter (Signed)

## 2019-05-14 ENCOUNTER — Telehealth: Payer: Self-pay | Admitting: Cardiovascular Disease

## 2019-05-14 NOTE — Telephone Encounter (Signed)
Pt returning this office call please give him a call back.

## 2019-05-14 NOTE — Telephone Encounter (Signed)
Smartphone/consent/ my chart/ pre reg completed °

## 2019-05-18 ENCOUNTER — Telehealth (INDEPENDENT_AMBULATORY_CARE_PROVIDER_SITE_OTHER): Payer: Medicare Other | Admitting: Cardiovascular Disease

## 2019-05-18 ENCOUNTER — Encounter: Payer: Self-pay | Admitting: Cardiovascular Disease

## 2019-05-18 VITALS — BP 104/62 | HR 59 | Ht 67.0 in | Wt 187.0 lb

## 2019-05-18 DIAGNOSIS — I701 Atherosclerosis of renal artery: Secondary | ICD-10-CM | POA: Diagnosis not present

## 2019-05-18 NOTE — Patient Instructions (Signed)
Medication Instructions:  Continue same medications If you need a refill on your cardiac medications before your next appointment, please call your pharmacy.   Lab work: None If you have labs (blood work) drawn today and your tests are completely normal, you will receive your results only by: . MyChart Message (if you have MyChart) OR . A paper copy in the mail If you have any lab test that is abnormal or we need to change your treatment, we will call you to review the results.  Testing/Procedures: None  Follow-Up: At [facility], you and your health needs are our priority.  As part of our continuing mission to provide you with exceptional heart care, we have created designated Provider Care Teams.  These Care Teams include your primary Cardiologist (physician) and Advanced Practice Providers (APPs -  Physician Assistants and Nurse Practitioners) who all work together to provide you with the care you need, when you need it. You will need a follow up appointment in 4 months.  Please call our office 2 months in advance to schedule this appointment.  You may see Demetris Meinhardt, MD or one of the following Advanced Practice Providers on your designated Care Team:   Luke Kilroy, PA-C Krista Kroeger, PA-C . Callie Goodrich, PA-C  
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
## 2019-05-25 ENCOUNTER — Encounter: Payer: Self-pay | Admitting: Neurology

## 2019-05-27 ENCOUNTER — Ambulatory Visit: Payer: Medicare Other | Admitting: Neurology

## 2019-05-28 ENCOUNTER — Encounter: Payer: Self-pay | Admitting: Neurology
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
## 2019-05-31 ENCOUNTER — Encounter: Payer: Self-pay | Admitting: Neurology

## 2019-05-31 ENCOUNTER — Telehealth: Payer: Self-pay | Admitting: *Deleted

## 2019-05-31 ENCOUNTER — Other Ambulatory Visit: Payer: Self-pay

## 2019-05-31 ENCOUNTER — Telehealth (INDEPENDENT_AMBULATORY_CARE_PROVIDER_SITE_OTHER): Payer: Medicare Other | Admitting: Neurology

## 2019-05-31 VITALS — Wt 188.0 lb

## 2019-05-31 DIAGNOSIS — R51 Headache: Secondary | ICD-10-CM | POA: Diagnosis not present

## 2019-05-31 DIAGNOSIS — R519 Headache, unspecified: Secondary | ICD-10-CM

## 2019-05-31 DIAGNOSIS — I6523 Occlusion and stenosis of bilateral carotid arteries: Secondary | ICD-10-CM

## 2019-05-31 NOTE — Telephone Encounter (Signed)
Message left,re: follow up visit. 

## 2019-05-31 NOTE — Telephone Encounter (Signed)
Follow up  ° ° °Pt is returning call  ° ° °Please call back  °

## 2019-07-04 ENCOUNTER — Other Ambulatory Visit: Payer: Self-pay | Admitting: Internal Medicine

## 2019-07-11 ENCOUNTER — Other Ambulatory Visit: Payer: Self-pay | Admitting: Internal Medicine

## 2019-08-05 ENCOUNTER — Encounter: Payer: Self-pay | Admitting: Internal Medicine

## 2019-08-05 ENCOUNTER — Other Ambulatory Visit: Payer: Self-pay

## 2019-08-05 ENCOUNTER — Ambulatory Visit (INDEPENDENT_AMBULATORY_CARE_PROVIDER_SITE_OTHER): Payer: Medicare Other | Admitting: Internal Medicine

## 2019-08-05 VITALS — BP 153/84 | HR 57 | Temp 98.2°F | Resp 18 | Ht 67.0 in | Wt 192.4 lb

## 2019-08-05 DIAGNOSIS — I10 Essential (primary) hypertension: Secondary | ICD-10-CM

## 2019-08-05 DIAGNOSIS — R739 Hyperglycemia, unspecified: Secondary | ICD-10-CM

## 2019-08-05 DIAGNOSIS — M150 Primary generalized (osteo)arthritis: Secondary | ICD-10-CM

## 2019-08-05 DIAGNOSIS — I701 Atherosclerosis of renal artery: Secondary | ICD-10-CM

## 2019-08-05 DIAGNOSIS — G629 Polyneuropathy, unspecified: Secondary | ICD-10-CM

## 2019-08-05 DIAGNOSIS — E7800 Pure hypercholesterolemia, unspecified: Secondary | ICD-10-CM | POA: Diagnosis not present

## 2019-08-05 DIAGNOSIS — R1310 Dysphagia, unspecified: Secondary | ICD-10-CM

## 2019-08-05 DIAGNOSIS — M159 Polyosteoarthritis, unspecified: Secondary | ICD-10-CM

## 2019-08-05 LAB — CBC WITH DIFFERENTIAL/PLATELET
Basophils Absolute: 0.1 10*3/uL (ref 0.0–0.1)
Basophils Relative: 1.3 % (ref 0.0–3.0)
Eosinophils Absolute: 0.4 10*3/uL (ref 0.0–0.7)
Eosinophils Relative: 4 % (ref 0.0–5.0)
HCT: 44.4 % (ref 39.0–52.0)
Hemoglobin: 15.1 g/dL (ref 13.0–17.0)
Lymphocytes Relative: 20.8 % (ref 12.0–46.0)
Lymphs Abs: 1.9 10*3/uL (ref 0.7–4.0)
MCHC: 34 g/dL (ref 30.0–36.0)
MCV: 92.4 fl (ref 78.0–100.0)
Monocytes Absolute: 0.8 10*3/uL (ref 0.1–1.0)
Monocytes Relative: 8.4 % (ref 3.0–12.0)
Neutro Abs: 6 10*3/uL (ref 1.4–7.7)
Neutrophils Relative %: 65.5 % (ref 43.0–77.0)
Platelets: 271 10*3/uL (ref 150.0–400.0)
RBC: 4.8 Mil/uL (ref 4.22–5.81)
RDW: 12.5 % (ref 11.5–15.5)
WBC: 9.2 10*3/uL (ref 4.0–10.5)

## 2019-08-05 LAB — COMPREHENSIVE METABOLIC PANEL
ALT: 24 U/L (ref 0–53)
AST: 24 U/L (ref 0–37)
Albumin: 4.3 g/dL (ref 3.5–5.2)
Alkaline Phosphatase: 79 U/L (ref 39–117)
BUN: 23 mg/dL (ref 6–23)
CO2: 31 mEq/L (ref 19–32)
Calcium: 10.2 mg/dL (ref 8.4–10.5)
Chloride: 101 mEq/L (ref 96–112)
Creatinine, Ser: 1.02 mg/dL (ref 0.40–1.50)
GFR: 70.6 mL/min (ref >60.00)
Glucose, Bld: 96 mg/dL (ref 70–99)
Potassium: 4.6 mEq/L (ref 3.5–5.1)
Sodium: 138 mEq/L (ref 135–145)
Total Bilirubin: 0.6 mg/dL (ref 0.2–1.2)
Total Protein: 6.3 g/dL (ref 6.0–8.3)

## 2019-08-05 LAB — TSH: TSH: 0.77 u[IU]/mL (ref 0.35–4.50)

## 2019-08-05 LAB — LDL CHOLESTEROL, DIRECT: Direct LDL: 133 mg/dL

## 2019-08-05 LAB — B12 AND FOLATE PANEL
Folate: >24.7 ng/mL (ref >5.9)
Vitamin B-12: 905 pg/mL (ref 211–911)

## 2019-08-05 LAB — LIPID PANEL
Cholesterol: 198 mg/dL (ref 0–200)
HDL: 39.7 mg/dL (ref >39.00)
NonHDL: 158.58
Total CHOL/HDL Ratio: 5
Triglycerides: 313 mg/dL — ABNORMAL HIGH (ref 0.0–149.0)
VLDL: 62.6 mg/dL — ABNORMAL HIGH (ref 0.0–40.0)

## 2019-08-05 LAB — HEMOGLOBIN A1C: Hgb A1c MFr Bld: 6.5 % (ref 4.6–6.5)

## 2019-08-05 MED ORDER — GABAPENTIN 300 MG PO CAPS: 300.0000 mg | ORAL_CAPSULE | Freq: Every day | ORAL | 6 refills | Status: DC

## 2019-08-05 MED FILL — GABAPENTIN 300 MG CAPSULE: 300 | 30 days supply | Qty: 30 | Fill #0
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
## 2019-08-05 NOTE — Patient Instructions (Addendum)
Please schedule Medicare Wellness with Glenard Haring.   GO TO THE LAB : Get the blood work     GO TO THE FRONT DESK Schedule your next appointment   for a checkup in 3 months    Continue checking your blood pressure  Start gabapentin 300 mg 1 capsule every night

## 2019-08-05 NOTE — Progress Notes (Signed)
Pre visit review using our clinic review tool, if applicable. No additional management support is needed unless otherwise documented below in the visit note. 

## 2019-08-06 NOTE — Assessment & Plan Note (Signed)
Prediabetes: Check A1c HTN: Continue carvedilol and HCTZ, BP today slightly elevated, consistently controlled  at home, no change, check a CMP, CBC, TSH. Hyperlipidemia: On Zetia, checking labs Dysphagia: Chronic issue, lately, refer to GI DJD, chronic neck and back pain: Back pain exacerbated for few weeks, to see Ortho in few days.  Hoping to get a local injection Declined need to take tramadol. Neuropathy?  6 weeks history of feet numbness/ legs feeling cold.  Good peripheral pulses, pinprick examination decreased distally, decreased DTRs at the lower extremities.  Suspect neuropathy.  Plan: Refer to neurology to confirm, NCS?[name]  Checking a B01, folic acid. Used to take gabapentin for back pain, restart gabapentin 300 mg nightly. Easy bruising: likely d/t capillary fragility  RTC 3 months

## 2019-08-10 DIAGNOSIS — M47812 Spondylosis without myelopathy or radiculopathy, cervical region: Secondary | ICD-10-CM | POA: Diagnosis not present

## 2019-08-10 DIAGNOSIS — M5136 Other intervertebral disc degeneration, lumbar region: Secondary | ICD-10-CM | POA: Diagnosis not present

## 2019-08-10 DIAGNOSIS — M47816 Spondylosis without myelopathy or radiculopathy, lumbar region: Secondary | ICD-10-CM | POA: Diagnosis not present

## 2019-08-10 DIAGNOSIS — I10 Essential (primary) hypertension: Secondary | ICD-10-CM | POA: Diagnosis not present

## 2019-08-10 DIAGNOSIS — M5030 Other cervical disc degeneration, unspecified cervical region: Secondary | ICD-10-CM | POA: Diagnosis not present

## 2019-08-12 ENCOUNTER — Other Ambulatory Visit (INDEPENDENT_AMBULATORY_CARE_PROVIDER_SITE_OTHER): Payer: Medicare Other

## 2019-08-12 ENCOUNTER — Encounter: Payer: Self-pay | Admitting: Neurology

## 2019-08-12 ENCOUNTER — Other Ambulatory Visit: Payer: Self-pay

## 2019-08-12 ENCOUNTER — Ambulatory Visit (INDEPENDENT_AMBULATORY_CARE_PROVIDER_SITE_OTHER): Payer: Medicare Other | Admitting: Neurology

## 2019-08-12 VITALS — BP 182/81 | HR 60 | Temp 97.7°F | Ht 67.0 in | Wt 193.0 lb

## 2019-08-12 DIAGNOSIS — G609 Hereditary and idiopathic neuropathy, unspecified: Secondary | ICD-10-CM | POA: Diagnosis not present

## 2019-08-12 DIAGNOSIS — M4802 Spinal stenosis, cervical region: Secondary | ICD-10-CM | POA: Diagnosis not present

## 2019-08-12 DIAGNOSIS — M48061 Spinal stenosis, lumbar region without neurogenic claudication: Secondary | ICD-10-CM

## 2019-08-12 DIAGNOSIS — I701 Atherosclerosis of renal artery: Secondary | ICD-10-CM

## 2019-08-12 DIAGNOSIS — R6889 Other general symptoms and signs: Secondary | ICD-10-CM | POA: Diagnosis not present

## 2019-08-12 DIAGNOSIS — I10 Essential (primary) hypertension: Secondary | ICD-10-CM

## 2019-08-12 LAB — SEDIMENTATION RATE: Sed Rate: 3 mm/hr (ref 0–20)
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
## 2019-08-12 NOTE — Patient Instructions (Addendum)
1.  We will check some blood work for causes of neuropathy:  ANA, sed rate, SPEP/IFE 2.  We will perform a new nerve conduction study 3.  Follow up with Dr. Larose Kells regarding blood pressure 4.  Follow up after testing.  Your provider has requested that you have labwork completed today.   Please go to [facility] ([id number]) on the second floor of this building before leaving the office today.   You do not need to check in. If you are not called within 15 minutes please check with the front desk.

## 2019-08-16 LAB — PROTEIN ELECTROPHORESIS, SERUM
Albumin ELP: 4.3 g/dL (ref 3.8–4.8)
Alpha 1: 0.3 g/dL (ref 0.2–0.3)
Alpha 2: 0.6 g/dL (ref 0.5–0.9)
Beta 2: 0.3 g/dL (ref 0.2–0.5)
Beta Globulin: 0.4 g/dL (ref 0.4–0.6)
Gamma Globulin: 0.7 g/dL — ABNORMAL LOW (ref 0.8–1.7)
Total Protein: 6.5 g/dL (ref 6.1–8.1)

## 2019-08-16 LAB — IMMUNOFIXATION ELECTROPHORESIS
IgG (Immunoglobin G), Serum: 770 mg/dL (ref 600–1540)
IgM, Serum: 38 mg/dL — ABNORMAL LOW (ref 50–300)
Immunoglobulin A: 144 mg/dL (ref 70–320)

## 2019-08-16 LAB — ANA: Anti Nuclear Antibody (ANA): NEGATIVE

## 2019-08-17 ENCOUNTER — Telehealth: Payer: Self-pay

## 2019-08-17 NOTE — Telephone Encounter (Signed)
Called patient- no answer & left message to call office back

## 2019-08-17 NOTE — Telephone Encounter (Signed)
-----   Message from Pieter Partridge, DO sent at 08/17/2019  4:02 PM EDT ----- Labs are unremarkable

## 2019-08-18 NOTE — Telephone Encounter (Signed)
Patient was returning your call. Thanks!  °

## 2019-08-18 NOTE — Telephone Encounter (Signed)
Spoke with patient he was informed of results

## 2019-08-19 DIAGNOSIS — M5440 Lumbago with sciatica, unspecified side: Secondary | ICD-10-CM | POA: Diagnosis not present

## 2019-08-19 MED FILL — METHYLPREDNISOLONE 4 MG TAB: 4 | 6 days supply | Qty: 21 | Fill #0

## 2019-08-20 ENCOUNTER — Other Ambulatory Visit: Payer: Self-pay

## 2019-08-20 ENCOUNTER — Encounter: Payer: Self-pay | Admitting: Internal Medicine

## 2019-08-20 ENCOUNTER — Ambulatory Visit (INDEPENDENT_AMBULATORY_CARE_PROVIDER_SITE_OTHER): Payer: Medicare Other | Admitting: Internal Medicine

## 2019-08-20 VITALS — BP 166/71 | HR 67 | Temp 98.1°F | Resp 16 | Ht 67.0 in | Wt 192.1 lb

## 2019-08-20 DIAGNOSIS — T148XXA Other injury of unspecified body region, initial encounter: Secondary | ICD-10-CM | POA: Diagnosis not present

## 2019-08-20 DIAGNOSIS — I701 Atherosclerosis of renal artery: Secondary | ICD-10-CM | POA: Diagnosis not present

## 2019-08-20 DIAGNOSIS — I10 Essential (primary) hypertension: Secondary | ICD-10-CM | POA: Diagnosis not present

## 2019-08-20 NOTE — Patient Instructions (Signed)
Please call me if no better in few days
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
## 2019-08-20 NOTE — Progress Notes (Signed)
Pre visit review using our clinic review tool, if applicable. No additional management support is needed unless otherwise documented below in the visit note. 

## 2019-08-21 NOTE — Assessment & Plan Note (Signed)
Bruising: Right leg bruising, no evidence of any major problem.  Recommend observation.  If bruising continue will do further evaluation including PT PTT.  Last CBC with normal platelets. HTN: BP today again elevated, normal at home, no change.

## 2019-08-22 ENCOUNTER — Other Ambulatory Visit: Payer: Self-pay | Admitting: Cardiovascular Disease

## 2019-08-23 ENCOUNTER — Other Ambulatory Visit: Payer: Self-pay | Admitting: Neurosurgery

## 2019-08-23 DIAGNOSIS — M5440 Lumbago with sciatica, unspecified side: Secondary | ICD-10-CM

## 2019-08-23 NOTE — Telephone Encounter (Signed)
Please review for refill on Zetia. Thanks!

## 2019-09-02 ENCOUNTER — Encounter: Payer: Self-pay | Admitting: Internal Medicine

## 2019-09-02 MED FILL — FLUAD QUADRIVALENT 0.5 ML P: 0.5 | 1 days supply | Qty: 1 | Fill #0

## 2019-09-02 MED FILL — GABAPENTIN 300 MG CAPSULE: 300 | 30 days supply | Qty: 30 | Fill #1

## 2019-09-07 ENCOUNTER — Encounter: Payer: Self-pay | Admitting: Cardiovascular Disease

## 2019-09-07 ENCOUNTER — Other Ambulatory Visit: Payer: Self-pay

## 2019-09-07 ENCOUNTER — Ambulatory Visit (INDEPENDENT_AMBULATORY_CARE_PROVIDER_SITE_OTHER): Payer: Medicare Other | Admitting: Cardiovascular Disease

## 2019-09-07 VITALS — BP 182/76 | HR 57 | Temp 97.5°F | Ht 67.0 in | Wt 192.8 lb

## 2019-09-07 DIAGNOSIS — I701 Atherosclerosis of renal artery: Secondary | ICD-10-CM

## 2019-09-07 DIAGNOSIS — I739 Peripheral vascular disease, unspecified: Secondary | ICD-10-CM | POA: Diagnosis not present

## 2019-09-07 DIAGNOSIS — E7800 Pure hypercholesterolemia, unspecified: Secondary | ICD-10-CM | POA: Diagnosis not present

## 2019-09-07 DIAGNOSIS — I10 Essential (primary) hypertension: Secondary | ICD-10-CM | POA: Diagnosis not present

## 2019-09-07 DIAGNOSIS — I779 Disorder of arteries and arterioles, unspecified: Secondary | ICD-10-CM

## 2019-09-07 MED ORDER — ATORVASTATIN CALCIUM 10 MG PO TABS: 10.0000 mg | ORAL_TABLET | Freq: Every day | ORAL | 3 refills | Status: DC

## 2019-09-07 NOTE — Patient Instructions (Signed)
Medication Instructions:  START Atorvastatin 10 mg once daily  If you need a refill on your cardiac medications before your next appointment, please call your pharmacy.   Lab work: Your provider would like for you to return in 2 months to have the following labs drawn: fasting lipid and liver. You do not need an appointment for the lab. Once in our office lobby there is a podium where you can sign in and ring the doorbell to alert [place] that you are here. The lab is open from 8:00 am to 4:30 pm; closed for lunch from 12:45pm-1:45pm.  If you have labs (blood work) drawn today and your tests are completely normal, you will receive your results only by: Marland Kitchen MyChart Message (if you have MyChart) OR . A paper copy in the mail If you have any lab test that is abnormal or we need to change your treatment, we will call you to review the results.  Testing/Procedures: None ordered  Follow-Up: At [facility], you and your health needs are our priority.  As part of our continuing mission to provide you with exceptional heart care, we have created designated Provider Care Teams.  These Care Teams include your primary Cardiologist (physician) and Advanced Practice Providers (APPs -  Physician Assistants and Nurse Practitioners) who all work together to provide you with the care you need, when you need it. You will need a follow up appointment in 12 months.  Please call our office 2 months in advance to schedule this appointment.  You may see Kathlyn Sacramento, MD or one of the following Advanced Practice Providers on your designated Care Team: [place], [place] . Fabian Sharp, PA-C
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
## 2019-09-10 ENCOUNTER — Ambulatory Visit
Admission: RE | Admit: 2019-09-10 | Discharge: 2019-09-10 | Disposition: A | Discharge status: Home / Self Care | Payer: Medicare Other | Source: Ambulatory Visit | Attending: Neurosurgery | Admitting: Neurosurgery

## 2019-09-10 ENCOUNTER — Other Ambulatory Visit: Payer: Self-pay

## 2019-09-10 DIAGNOSIS — M5440 Lumbago with sciatica, unspecified side: Secondary | ICD-10-CM

## 2019-09-10 DIAGNOSIS — M48061 Spinal stenosis, lumbar region without neurogenic claudication: Secondary | ICD-10-CM | POA: Diagnosis not present

## 2019-09-14 DIAGNOSIS — M5440 Lumbago with sciatica, unspecified side: Secondary | ICD-10-CM | POA: Diagnosis not present

## 2019-09-15 ENCOUNTER — Encounter: Payer: Self-pay | Admitting: Internal Medicine

## 2019-09-15 ENCOUNTER — Ambulatory Visit (INDEPENDENT_AMBULATORY_CARE_PROVIDER_SITE_OTHER): Payer: Medicare Other | Admitting: Internal Medicine

## 2019-09-15 VITALS — BP 144/76 | HR 80 | Temp 97.9°F | Ht 67.0 in | Wt 192.0 lb

## 2019-09-15 DIAGNOSIS — Z86010 Personal history of colonic polyps: Secondary | ICD-10-CM | POA: Diagnosis not present

## 2019-09-15 DIAGNOSIS — H353132 Nonexudative age-related macular degeneration, bilateral, intermediate dry stage: Secondary | ICD-10-CM | POA: Diagnosis not present

## 2019-09-15 DIAGNOSIS — H02831 Dermatochalasis of right upper eyelid: Secondary | ICD-10-CM | POA: Diagnosis not present

## 2019-09-15 DIAGNOSIS — K219 Gastro-esophageal reflux disease without esophagitis: Secondary | ICD-10-CM | POA: Diagnosis not present

## 2019-09-15 DIAGNOSIS — Z961 Presence of intraocular lens: Secondary | ICD-10-CM | POA: Diagnosis not present

## 2019-09-15 DIAGNOSIS — I701 Atherosclerosis of renal artery: Secondary | ICD-10-CM | POA: Diagnosis not present

## 2019-09-15 DIAGNOSIS — R4702 Dysphasia: Secondary | ICD-10-CM | POA: Diagnosis not present

## 2019-09-15 DIAGNOSIS — H02834 Dermatochalasis of left upper eyelid: Secondary | ICD-10-CM | POA: Diagnosis not present

## 2019-09-15 NOTE — Patient Instructions (Signed)
You have been scheduled for a Barium Esophogram at [facility] (1st floor of the hospital) on 09/23/2019 at 10:30am. Please arrive 15 minutes prior to your appointment for registration. Make certain not to have anything to eat or drink 3 hours prior to your test. If you need to reschedule for any reason, please contact radiology at [phone] to do so. __________________________________________________________________ A barium swallow is an examination that concentrates on views of the esophagus. This tends to be a double contrast exam (barium and two liquids which, when combined, create a gas to distend the wall of the oesophagus) or single contrast (non-ionic iodine based). The study is usually tailored to your symptoms so a good history is essential. Attention is paid during the study to the form, structure and configuration of the esophagus, looking for functional disorders (such as aspiration, dysphagia, achalasia, motility and reflux) EXAMINATION You may be asked to change into a gown, depending on the type of swallow being performed. A radiologist and radiographer will perform the procedure. The radiologist will advise you of the type of contrast selected for your procedure and direct you during the exam. You will be asked to stand, sit or lie in several different positions and to hold a small amount of fluid in your mouth before being asked to swallow while the imaging is performed .In some instances you may be asked to swallow barium coated marshmallows to assess the motility of a solid food bolus. The exam can be recorded as a digital or video fluoroscopy procedure. POST PROCEDURE It will take 1-2 days for the barium to pass through your system. To facilitate this, it is important, unless otherwise directed, to increase your fluids for the next 24-48hrs and to resume your normal diet.  This test typically takes about 30 minutes to  perform. __________________________________________________________________________________
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
## 2019-09-16 ENCOUNTER — Other Ambulatory Visit: Payer: Self-pay

## 2019-09-16 ENCOUNTER — Ambulatory Visit (INDEPENDENT_AMBULATORY_CARE_PROVIDER_SITE_OTHER): Payer: Medicare Other | Admitting: Neurology

## 2019-09-16 DIAGNOSIS — M48061 Spinal stenosis, lumbar region without neurogenic claudication: Secondary | ICD-10-CM | POA: Diagnosis not present

## 2019-09-16 DIAGNOSIS — G629 Polyneuropathy, unspecified: Secondary | ICD-10-CM

## 2019-09-16 NOTE — Procedures (Signed)
[facility]  [place], [address] [address]  [place], [place] [address] Tel: [phone] Fax:  [phone] Test Date:  09/16/2019  Patient: [name] DOB: Apr 22, 1941 Physician: Narda Amber, DO  Sex: Male Height: 5\' 7"  Ref Phys: Metta Clines, DO  ID#: [id number] Temp: 33.0C Technician:    Patient Complaints: This is a 78 year old man with history of cervical and lumbar surgery referred for evaluation of generalized paresthesias of the arms and legs.  NCV & EMG Findings: Extensive electrodiagnostic testing of the right upper and lower extremity shows:  1. Right median and radial sensory responses show reduced amplitude (R9.2, R8.5 V).  Right ulnar sensory responses within normal limits.  2. Right sural and superficial peroneal sensory responses are absent.  3. All motor responses including the right median, ulnar, peroneal, and tibial nerves are within normal limits.  4. Right tibial H reflex study shows prolonged latency.  5. In the right upper extremity, chronic motor axonal loss changes are seen affecting the distal hand muscles, biceps, and deltoid muscles.  6. In the right lower extremity, chronic motor axonal loss changes are seen affecting the muscles below the knee as well as the biceps femoris short head muscles.  There is no evidence of accompanied active denervation.    Impression: 1. Chronic, length-dependent, sensorimotor axonal polyneuropathy affecting the right upper and lower extremities.  Overall, these findings are moderate in degree electrically. 2. Chronic right S1 radiculopathy, moderate. 3. Chronic right C5 radiculopathy, mild.   ___________________________ Narda Amber, DO    Nerve Conduction Studies Anti Sensory Summary Table   Site NR Peak (ms) Norm Peak (ms) P-T Amp (V) Norm P-T Amp  Right Median Anti Sensory (2nd Digit)  33C  Wrist    3.5 <3.8 9.2 >10  Right Radial Anti Sensory (Base 1st Digit)  33C  Wrist    2.4 <2.8 8.5 >10  Right Sup  Peroneal Anti Sensory (Ant Lat Mall)  33C  12 cm NR  <4.6  >3  Right Sural Anti Sensory (Lat Mall)  33C  Calf NR  <4.6  >3  Right Ulnar Anti Sensory (5th Digit)  33C  Wrist    2.9 <3.2 9.0 >5   Motor Summary Table   Site NR Onset (ms) Norm Onset (ms) O-P Amp (mV) Norm O-P Amp Site1 Site2 Delta-0 (ms) Dist (cm) Vel (m/s) Norm Vel (m/s)  Right Median Motor (Abd Poll Brev)  33C  Wrist    3.3 <4.0 7.4 >5 Elbow Wrist 5.5 30.0 55 >50  Elbow    8.8  6.9         Right Peroneal Motor (Ext Dig Brev)  33C  Ankle    3.6 <6.0 3.4 >2.5 B Fib Ankle 9.1 38.0 42 >40  B Fib    12.7  3.4  Poplt B Fib 1.6 8.0 50 >40  Poplt    14.3  3.4         Right Tibial Motor (Abd Hall Brev)  33C  Ankle    4.0 <6.0 11.5 >4 Knee Ankle 10.1 41.0 41 >40  Knee    14.1  9.2         Right Ulnar Motor (Abd Dig Minimi)  33C  Wrist    2.4 <3.1 10.7 >7 B Elbow Wrist 4.6 25.0 54 >50  B Elbow    7.0  9.9  A Elbow B Elbow 1.9 10.0 53 >50  A Elbow    8.9  9.6  H Reflex Studies   NR H-Lat (ms) Lat Norm (ms) L-R H-Lat (ms)  Right Tibial (Gastroc)  33C     37.69 <35    EMG   Side Muscle Ins Act Fibs Psw Fasc Number Recrt Dur Dur. Amp Amp. Poly Poly. Comment  Right AntTibialis Nml Nml Nml Nml 1- Rapid Many 1+ Many 1+ Some 1+ N/A  Right 1stDorInt Nml Nml Nml Nml 1- Rapid Few 1+ Few 1+ Nml Nml N/A  Right Abd Poll Brev Nml Nml Nml Nml 1- Rapid Some 1+ Some 1+ Some 1+ N/A  Right Ext Indicis Nml Nml Nml Nml 1- Rapid Few 1+ Few 1+ Nml Nml N/A  Right PronatorTeres Nml Nml Nml Nml Nml Nml Nml Nml Nml Nml Nml Nml N/A  Right Biceps Nml Nml Nml Nml 1- Rapid Few 1+ Few 1+ Nml Nml N/A  Right Deltoid Nml Nml Nml Nml 1- Rapid Few 1+ Few 1+ Nml Nml N/A  Right Triceps Nml Nml Nml Nml Nml Nml Nml Nml Nml Nml Nml Nml N/A  Right Gastroc Nml Nml Nml Nml 1- Rapid Many 1+ Many 1+ Nml Nml N/A  Right Flex Dig Long Nml Nml Nml Nml 2- Rapid Many 1+ Many 1+ Some 1+ N/A  Right RectFemoris Nml Nml Nml Nml Nml Nml Nml Nml Nml Nml Nml Nml  N/A  Right GluteusMed Nml Nml Nml Nml Nml Nml Nml Nml Nml Nml Nml Nml N/A  Right BicepsFemS Nml Nml Nml Nml 1- Rapid Many 1+ Many 1+ Some 1+ N/A      Waveforms:

## 2019-09-23 ENCOUNTER — Telehealth: Payer: Self-pay | Admitting: Internal Medicine

## 2019-09-23 ENCOUNTER — Other Ambulatory Visit: Payer: Self-pay

## 2019-09-23 ENCOUNTER — Ambulatory Visit (HOSPITAL_COMMUNITY)
Admission: RE | Admit: 2019-09-23 | Discharge: 2019-09-23 | Disposition: A | Discharge status: Home / Self Care | Payer: Medicare Other | Source: Ambulatory Visit | Attending: Internal Medicine | Admitting: Internal Medicine

## 2019-09-23 DIAGNOSIS — R4702 Dysphasia: Secondary | ICD-10-CM

## 2019-09-23 DIAGNOSIS — K224 Dyskinesia of esophagus: Secondary | ICD-10-CM | POA: Diagnosis not present

## 2019-09-23 DIAGNOSIS — Z86010 Personal history of colonic polyps: Secondary | ICD-10-CM | POA: Insufficient documentation

## 2019-09-23 DIAGNOSIS — K219 Gastro-esophageal reflux disease without esophagitis: Secondary | ICD-10-CM | POA: Diagnosis not present

## 2019-09-23 NOTE — Telephone Encounter (Signed)
See result note.  

## 2019-09-24 ENCOUNTER — Other Ambulatory Visit (HOSPITAL_COMMUNITY): Payer: Self-pay | Admitting: *Deleted

## 2019-09-24 DIAGNOSIS — R1310 Dysphagia, unspecified: Secondary | ICD-10-CM

## 2019-09-30 ENCOUNTER — Ambulatory Visit (HOSPITAL_COMMUNITY)
Admission: RE | Admit: 2019-09-30 | Discharge: 2019-09-30 | Disposition: A | Discharge status: Home / Self Care | Payer: Medicare Other | Source: Ambulatory Visit | Attending: Internal Medicine | Admitting: Internal Medicine

## 2019-09-30 ENCOUNTER — Other Ambulatory Visit: Payer: Self-pay

## 2019-09-30 DIAGNOSIS — R4702 Dysphasia: Secondary | ICD-10-CM | POA: Insufficient documentation

## 2019-09-30 DIAGNOSIS — R1310 Dysphagia, unspecified: Secondary | ICD-10-CM | POA: Insufficient documentation

## 2019-09-30 DIAGNOSIS — K219 Gastro-esophageal reflux disease without esophagitis: Secondary | ICD-10-CM | POA: Diagnosis not present

## 2019-09-30 DIAGNOSIS — R05 Cough: Secondary | ICD-10-CM | POA: Diagnosis not present

## 2019-10-01 ENCOUNTER — Telehealth: Payer: Self-pay | Admitting: Internal Medicine

## 2019-10-01 NOTE — Telephone Encounter (Signed)
See result note.  

## 2019-10-01 NOTE — Telephone Encounter (Signed)
Pt stated that he is returning your call.  °

## 2019-10-01 NOTE — Telephone Encounter (Signed)
Pt called again, pls call him when you have a chance.

## 2019-10-05 MED FILL — GABAPENTIN 300 MG CAPSULE: 300 | 30 days supply | Qty: 30 | Fill #2

## 2019-10-14 ENCOUNTER — Ambulatory Visit (AMBULATORY_SURGERY_CENTER): Payer: Medicare Other | Admitting: *Deleted

## 2019-10-14 ENCOUNTER — Other Ambulatory Visit: Payer: Self-pay

## 2019-10-14 VITALS — Temp 96.8°F | Ht 67.0 in | Wt 194.0 lb

## 2019-10-14 DIAGNOSIS — R4702 Dysphasia: Secondary | ICD-10-CM

## 2019-10-14 DIAGNOSIS — Z1159 Encounter for screening for other viral diseases: Secondary | ICD-10-CM

## 2019-10-14 NOTE — Progress Notes (Signed)
Patient is here in-person for PV. Patient denies any allergies to eggs or soy. Patient denies any problems with anesthesia/sedation. Patient denies any oxygen use at home. Patient denies taking any diet/weight loss medications or blood thinners. Patient is not being treated for MRSA or C-diff.   Pt is aware that care partner will wait in the car during procedure; if they feel like they will be too hot or cold to wait in the car; they may wait in the 4 th floor lobby. Patient is aware to bring only one care partner. We want them to wear a mask (we do not have any that we can provide them), practice social distancing, and we will check their temperatures when they get here.  I did remind the patient that their care partner needs to stay in the parking lot the entire time and have a cell phone available, we will call them when the pt is ready for discharge. Patient will wear mask into building.  Covid test on 10/27- pt is aware!

## 2019-10-19 LAB — SARS CORONAVIRUS 2 (TAT 6-24 HRS): SARS Coronavirus 2: NEGATIVE

## 2019-10-22 ENCOUNTER — Encounter: Payer: Self-pay | Admitting: Internal Medicine

## 2019-10-22 ENCOUNTER — Ambulatory Visit (AMBULATORY_SURGERY_CENTER): Payer: Medicare Other | Admitting: Internal Medicine

## 2019-10-22 ENCOUNTER — Other Ambulatory Visit: Payer: Self-pay

## 2019-10-22 VITALS — BP 122/60 | HR 52 | Temp 97.9°F | Resp 21 | Ht 67.0 in | Wt 194.0 lb

## 2019-10-22 DIAGNOSIS — R4702 Dysphasia: Secondary | ICD-10-CM

## 2019-10-22 DIAGNOSIS — R933 Abnormal findings on diagnostic imaging of other parts of digestive tract: Secondary | ICD-10-CM | POA: Diagnosis not present

## 2019-10-22 MED ORDER — SODIUM CHLORIDE 0.9 % IV SOLN: 500.0000 mL | INTRAVENOUS | Status: DC

## 2019-10-22 NOTE — Progress Notes (Signed)
Temp JB  V/S CW  I have reviewed the patient's medical history in detail and updated the computerized patient record. 

## 2019-10-22 NOTE — Progress Notes (Signed)
Called to room to assist during endoscopic procedure.  Patient ID and intended procedure confirmed with present staff. Received instructions for my participation in the procedure from the performing physician.  

## 2019-10-22 NOTE — Op Note (Signed)
[name]: [name] Procedure Date: 10/22/2019 2:37 PM MRN: [id number] Endoscopist: Docia Chuck. [name] , MD Age: 78 Referring MD:  Date of Birth: [birth year] Gender: Male Account #: [id number] Procedure:                Upper GI endoscopy with [facility] dilation of the                            esophagus. 25 [place] Indications:              Dysphagia, Abnormal cine-esophagram Medicines:                Monitored Anesthesia Care Procedure:                Pre-Anesthesia Assessment:                           - Prior to the procedure, a History and Physical                            was performed, and patient medications and                            allergies were reviewed. The patient's tolerance of                            previous anesthesia was also reviewed. The risks                            and benefits of the procedure and the sedation                            options and risks were discussed with the patient.                            All questions were answered, and informed consent                            was obtained. Prior Anticoagulants: The patient has                            taken no previous anticoagulant or antiplatelet                            agents. ASA Grade Assessment: II - A patient with                            mild systemic disease. After reviewing the risks                            and benefits, the patient was deemed in                            satisfactory condition to undergo the procedure.  After obtaining informed consent, the endoscope was                            passed under direct vision. Throughout the                            procedure, the patient's blood pressure, pulse, and                            oxygen saturations were monitored continuously. The                            Endoscope was introduced through the mouth, and                            advanced to the second  part of duodenum. The upper                            GI endoscopy was accomplished without difficulty.                            The patient tolerated the procedure well. Scope In: Scope Out: Findings:                 The esophagus was normal. The scope was withdrawn.                            Dilation was performed with a Maloney dilator with                            no resistance at 63 Fr.                           The stomach was normal.                           The examined duodenum was normal.                           The cardia and gastric fundus were normal on                            retroflexion. Complications:            No immediate complications. Estimated Blood Loss:     Estimated blood loss: none. Impression:               - Normal esophagus. Dilated.                           - Normal stomach.                           - Normal examined duodenum.                           - No specimens collected. Recommendation:           -  Patient has a contact number available for                            emergencies. The signs and symptoms of potential                            delayed complications were discussed with the                            patient. Return to normal activities tomorrow.                            Written discharge instructions were provided to the                            patient.                           - Post dilation diet.                           - Continue present medications. Docia Chuck. Christopher Pastor, MD 10/22/2019 3:03:04 PM This report has been signed electronically.

## 2019-10-22 NOTE — Progress Notes (Signed)
A and O x3. Report to RN. Tolerated MAC anesthesia well.Teeth unchanged after procedure.

## 2019-10-22 NOTE — Patient Instructions (Signed)
HANDOUT FOR POST DILATION DIET.   YOU HAD AN ENDOSCOPIC PROCEDURE TODAY AT [facility]:   Refer to the procedure report that was given to you for any specific questions about what was found during the examination.  If the procedure report does not answer your questions, please call your gastroenterologist to clarify.  If you requested that your care partner not be given the details of your procedure findings, then the procedure report has been included in a sealed envelope for you to review at your convenience later.  YOU SHOULD EXPECT: Some feelings of bloating in the abdomen. Passage of more gas than usual.  Walking can help get rid of the air that was put into your GI tract during the procedure and reduce the bloating. If you had a lower endoscopy (such as a colonoscopy or flexible sigmoidoscopy) you may notice spotting of blood in your stool or on the toilet paper. If you underwent a bowel prep for your procedure, you may not have a normal bowel movement for a few days.  Please Note:  You might notice some irritation and congestion in your nose or some drainage.  This is from the oxygen used during your procedure.  There is no need for concern and it should clear up in a day or so.  SYMPTOMS TO REPORT IMMEDIATELY:   Following upper endoscopy (EGD)  Vomiting of blood or coffee ground material  New chest pain or pain under the shoulder blades  Painful or persistently difficult swallowing  New shortness of breath  Fever of 100F or higher  Black, tarry-looking stools  For urgent or emergent issues, a gastroenterologist can be reached at any hour by calling [phone].   DIET:  SEE POST DILATION DIET HANDOUT.  NPO UNTIL 4PM. CLEAR LIQUIDS UNTIL 5PM.  THEN SOFT DIET FOR THE REST OF TODAY.  We do recommend a small meal at first, but then you may proceed to your regular diet TOMORROW.   Drink plenty of fluids but you should avoid alcoholic beverages for 24 hours.  ACTIVITY:   You should plan to take it easy for the rest of today and you should NOT DRIVE or use heavy machinery until tomorrow (because of the sedation medicines used during the test).    FOLLOW UP: Our staff will call the number listed on your records 48-72 hours following your procedure to check on you and address any questions or concerns that you may have regarding the information given to you following your procedure. If we do not reach you, we will leave a message.  We will attempt to reach you two times.  During this call, we will ask if you have developed any symptoms of COVID 19. If you develop any symptoms (ie: fever, flu-like symptoms, shortness of breath, cough etc.) before then, please call [phone].  If you test positive for Covid 19 in the 2 weeks post procedure, please call and report this information to [place].    If any biopsies were taken you will be contacted by phone or by letter within the next 1-3 weeks.  Please call us at [phone] if you have not heard about the biopsies in 3 weeks.    SIGNATURES/CONFIDENTIALITY: You and/or your care partner have signed paperwork which will be entered into your electronic medical record.  These signatures attest to the fact that that the information above on your After Visit Summary has been reviewed and is understood.  Full responsibility of the confidentiality of  this discharge information lies with you and/or your care-partner.

## 2019-10-26 ENCOUNTER — Telehealth: Payer: Self-pay | Admitting: *Deleted

## 2019-10-26 ENCOUNTER — Telehealth: Payer: Self-pay

## 2019-10-26 NOTE — Telephone Encounter (Signed)
No answer, left message to call back later today, B.Chikita Dogan RN. 

## 2019-10-26 NOTE — Telephone Encounter (Signed)
Second follow up call attempt.  Left message on voicemail to call if any questions or concerns regarding procedure/Covid19

## 2019-11-03 DIAGNOSIS — I10 Essential (primary) hypertension: Secondary | ICD-10-CM | POA: Diagnosis not present

## 2019-11-03 DIAGNOSIS — E7800 Pure hypercholesterolemia, unspecified: Secondary | ICD-10-CM | POA: Diagnosis not present

## 2019-11-04 ENCOUNTER — Encounter: Payer: Self-pay | Admitting: Internal Medicine

## 2019-11-04 LAB — LIPID PANEL
Chol/HDL Ratio: 3.5 ratio (ref 0.0–5.0)
Cholesterol, Total: 139 mg/dL (ref 100–199)
HDL: 40 mg/dL (ref >39)
LDL Chol Calc (NIH): 73 mg/dL (ref 0–99)
Triglycerides: 148 mg/dL (ref 0–149)
VLDL Cholesterol Cal: 26 mg/dL (ref 5–40)

## 2019-11-04 LAB — HEPATIC FUNCTION PANEL
ALT: 34 IU/L (ref 0–44)
AST: 29 IU/L (ref 0–40)
Albumin: 4.2 g/dL (ref 3.7–4.7)
Alkaline Phosphatase: 96 IU/L (ref 39–117)
Bilirubin Total: 0.6 mg/dL (ref 0.0–1.2)
Bilirubin, Direct: 0.18 mg/dL (ref 0.00–0.40)
Total Protein: 6.3 g/dL (ref 6.0–8.5)

## 2019-11-05 ENCOUNTER — Other Ambulatory Visit: Payer: Self-pay

## 2019-11-05 ENCOUNTER — Ambulatory Visit (INDEPENDENT_AMBULATORY_CARE_PROVIDER_SITE_OTHER): Payer: Medicare Other | Admitting: Internal Medicine

## 2019-11-05 DIAGNOSIS — I10 Essential (primary) hypertension: Secondary | ICD-10-CM | POA: Diagnosis not present

## 2019-11-05 DIAGNOSIS — I701 Atherosclerosis of renal artery: Secondary | ICD-10-CM | POA: Diagnosis not present

## 2019-11-05 DIAGNOSIS — M8949 Other hypertrophic osteoarthropathy, multiple sites: Secondary | ICD-10-CM

## 2019-11-05 DIAGNOSIS — E7800 Pure hypercholesterolemia, unspecified: Secondary | ICD-10-CM | POA: Diagnosis not present

## 2019-11-05 DIAGNOSIS — K130 Diseases of lips: Secondary | ICD-10-CM | POA: Diagnosis not present

## 2019-11-05 DIAGNOSIS — R1310 Dysphagia, unspecified: Secondary | ICD-10-CM

## 2019-11-05 DIAGNOSIS — M150 Primary generalized (osteo)arthritis: Secondary | ICD-10-CM

## 2019-11-05 DIAGNOSIS — M159 Polyosteoarthritis, unspecified: Secondary | ICD-10-CM
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
## 2019-11-05 NOTE — Assessment & Plan Note (Addendum)
-  Tdap 2018 -pneumonia shot 03-2009 - prevnar:2015  - s/p shingrex -Had a flu shot -CCS: h/o  several Cscopes due to polyps, Cscope  12-07, TICS , no polyp; Cscope 09-2011: 3 polyps; Colonoscopy 09-2018 , next per GI   -Prostate cancer screening:  PSAs done @ the [place]

## 2019-11-06 NOTE — Assessment & Plan Note (Signed)
Preventive care reviewed Prediabetes: Due for an A1c, this is a virtual visit will do on RTC HTN: Continue carvedilol, HCTZ, ambulatory BPs excellent, very rarely BPs are in the low side.  Needs a BMP, will do on RTC. High cholesterol: On Lipitor and Zetia, last FLP satisfactory DJD, chronic neck and back pain: Since the last visit,  Saw neurology and neurosurgery Dr. Saintclair Halsted. Had a NCV 08-2019: Chronic sensorimotor axonal polyneuropathy, chronic S1 and  C5 radiculopathy ;  MRI of the spine 08-2019, see results. Plans to see neurology Dr. Saintclair Halsted soon. Dysphagia: Had a normal EGD with dilatation 09-2019. Had a swallow test and advised what given by speech pathology. Lip lesion: As described above, refer to dermatology RTC  3 months
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
## 2019-11-12 ENCOUNTER — Ambulatory Visit (INDEPENDENT_AMBULATORY_CARE_PROVIDER_SITE_OTHER): Payer: Medicare Other | Admitting: Neurology

## 2019-11-12 ENCOUNTER — Encounter: Payer: Self-pay | Admitting: Internal Medicine

## 2019-11-12 ENCOUNTER — Other Ambulatory Visit: Payer: Self-pay

## 2019-11-12 ENCOUNTER — Encounter: Payer: Self-pay | Admitting: Neurology

## 2019-11-12 VITALS — BP 151/61 | HR 61 | Ht 67.0 in | Wt 194.2 lb

## 2019-11-12 DIAGNOSIS — I6529 Occlusion and stenosis of unspecified carotid artery: Secondary | ICD-10-CM | POA: Diagnosis not present

## 2019-11-12 DIAGNOSIS — G44221 Chronic tension-type headache, intractable: Secondary | ICD-10-CM | POA: Diagnosis not present

## 2019-11-12 DIAGNOSIS — R1312 Dysphagia, oropharyngeal phase: Secondary | ICD-10-CM

## 2019-11-12 DIAGNOSIS — I10 Essential (primary) hypertension: Secondary | ICD-10-CM | POA: Diagnosis not present

## 2019-11-12 DIAGNOSIS — M48061 Spinal stenosis, lumbar region without neurogenic claudication: Secondary | ICD-10-CM | POA: Diagnosis not present

## 2019-11-12 DIAGNOSIS — M4802 Spinal stenosis, cervical region: Secondary | ICD-10-CM | POA: Diagnosis not present

## 2019-11-12 DIAGNOSIS — I701 Atherosclerosis of renal artery: Secondary | ICD-10-CM

## 2019-11-12 DIAGNOSIS — G609 Hereditary and idiopathic neuropathy, unspecified: Secondary | ICD-10-CM | POA: Diagnosis not present

## 2019-11-12 DIAGNOSIS — R29898 Other symptoms and signs involving the musculoskeletal system: Secondary | ICD-10-CM

## 2019-11-12 MED ORDER — GABAPENTIN 100 MG PO CAPS: ORAL_CAPSULE | ORAL | 0 refills | Status: DC

## 2019-11-12 MED FILL — GABAPENTIN 100 MG CAPSULE: 100 | 44 days supply | Qty: 90 | Fill #0

## 2019-11-12 NOTE — Patient Instructions (Addendum)
1.  Check MRI of brain 2.  Increase gabapentin to 300mg  twice daily  Start gabapentin 100mg :  Take 1 capsule in AM for one week, then 2 capsules in AM for one week, then 3 capsules in AM.  Continue gabapentin 300mg  at bedtime  A referral to [facility] has been placed for your MRI someone will contact you directly to schedule your appt. They are located at [place]. Please contact them directly by calling [phone] with any questions regarding your referral.

## 2019-11-20 ENCOUNTER — Other Ambulatory Visit: Payer: Self-pay | Admitting: Cardiovascular Disease

## 2019-11-23 MED FILL — GABAPENTIN 300 MG CAPSULE: 300 | 30 days supply | Qty: 30 | Fill #3
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
## 2019-12-02 ENCOUNTER — Encounter: Payer: Self-pay | Admitting: *Deleted

## 2019-12-02 ENCOUNTER — Other Ambulatory Visit: Payer: Self-pay

## 2019-12-02 ENCOUNTER — Ambulatory Visit (INDEPENDENT_AMBULATORY_CARE_PROVIDER_SITE_OTHER): Payer: Medicare Other | Admitting: *Deleted

## 2019-12-02 VITALS — BP 111/65

## 2019-12-02 DIAGNOSIS — Z0000 Encounter for general adult medical examination without abnormal findings: Secondary | ICD-10-CM

## 2019-12-02 NOTE — Patient Instructions (Signed)
Please schedule your next medicare wellness visit with me in 1 yr.  Continue to eat heart healthy diet (full of fruits, vegetables, whole grains, lean protein, water--limit salt, fat, and sugar intake) and increase physical activity as tolerated.  Continue doing brain stimulating activities (puzzles, reading, adult coloring books, staying active) to keep memory sharp.   Bring a copy of your living will and/or healthcare power of attorney to your next office visit.   [name] , Thank you for taking time to come for your Medicare Wellness Visit. I appreciate your ongoing commitment to your health goals. Please review the following plan we discussed and let me know if I can assist you in the future.   These are the goals we discussed: Goals    . Patient Stated     Have disc issues addressed by Dr.Cram so that pt can be more active and play golf.        This is a list of the screening recommended for you and due dates:  Health Maintenance  Topic Date Due  . Colon Cancer Screening  10/07/2021  . Tetanus Vaccine  11/22/2027  . Flu Shot  Completed  . Pneumonia vaccines  Completed    Preventive Care 30 Years and Older, Male Preventive care refers to lifestyle choices and visits with your health care provider that can promote health and wellness. This includes:  A yearly physical exam. This is also called an annual well check.  Regular dental and eye exams.  Immunizations.  Screening for certain conditions.  Healthy lifestyle choices, such as diet and exercise. What can I expect for my preventive care visit? Physical exam Your health care provider will check:  Height and weight. These may be used to calculate body mass index (BMI), which is a measurement that tells if you are at a healthy weight.  Heart rate and blood pressure.  Your skin for abnormal spots. Counseling Your health care provider may ask you questions about:  Alcohol, tobacco, and drug use.  Emotional  well-being.  Home and relationship well-being.  Sexual activity.  Eating habits.  History of falls.  Memory and ability to understand (cognition).  Work and work Statistician. What immunizations do I need?  Influenza (flu) vaccine  This is recommended every year. Tetanus, diphtheria, and pertussis (Tdap) vaccine  You may need a Td booster every 10 years. Varicella (chickenpox) vaccine  You may need this vaccine if you have not already been vaccinated. Zoster (shingles) vaccine  You may need this after age 73. Pneumococcal conjugate (PCV13) vaccine  One dose is recommended after age 60. Pneumococcal polysaccharide (PPSV23) vaccine  One dose is recommended after age 63. Measles, mumps, and rubella (MMR) vaccine  You may need at least one dose of MMR if you were born in 1957 or later. You may also need a second dose. Meningococcal conjugate (MenACWY) vaccine  You may need this if you have certain conditions. Hepatitis A vaccine  You may need this if you have certain conditions or if you travel or work in places where you may be exposed to hepatitis A. Hepatitis B vaccine  You may need this if you have certain conditions or if you travel or work in places where you may be exposed to hepatitis B. Haemophilus influenzae type b (Hib) vaccine  You may need this if you have certain conditions. You may receive vaccines as individual doses or as more than one vaccine together in one shot (combination vaccines). Talk with your health  care provider about the risks and benefits of combination vaccines. What tests do I need? Blood tests  Lipid and cholesterol levels. These may be checked every 5 years, or more frequently depending on your overall health.  Hepatitis C test.  Hepatitis B test. Screening  Lung cancer screening. You may have this screening every year starting at age 69 if you have a 30-pack-year history of smoking and currently smoke or have quit within the  past 15 years.  Colorectal cancer screening. All adults should have this screening starting at age 39 and continuing until age 32. Your health care provider may recommend screening at age 26 if you are at increased risk. You will have tests every 1-10 years, depending on your results and the type of screening test.  Prostate cancer screening. Recommendations will vary depending on your family history and other risks.  Diabetes screening. This is done by checking your blood sugar (glucose) after you have not eaten for a while (fasting). You may have this done every 1-3 years.  Abdominal aortic aneurysm (AAA) screening. You may need this if you are a current or former smoker.  Sexually transmitted disease (STD) testing. Follow these instructions at home: Eating and drinking  Eat a diet that includes fresh fruits and vegetables, whole grains, lean protein, and low-fat dairy products. Limit your intake of foods with high amounts of sugar, saturated fats, and salt.  Take vitamin and mineral supplements as recommended by your health care provider.  Do not drink alcohol if your health care provider tells you not to drink.  If you drink alcohol: ? Limit how much you have to 0-2 drinks a day. ? Be aware of how much alcohol is in your drink. In the [place] one drink equals one 12 oz bottle of beer (355 mL), one 5 oz glass of wine (148 mL), or one 1 oz glass of hard liquor (44 mL). Lifestyle  Take daily care of your teeth and gums.  Stay active. Exercise for at least 30 minutes on 5 or more days each week.  Do not use any products that contain nicotine or tobacco, such as cigarettes, e-cigarettes, and chewing tobacco. If you need help quitting, ask your health care provider.  If you are sexually active, practice safe sex. Use a condom or other form of protection to prevent STIs (sexually transmitted infections).  Talk with your health care provider about taking a low-dose aspirin or statin.  What's next?  Visit your health care provider once a year for a well check visit.  Ask your health care provider how often you should have your eyes and teeth checked.  Stay up to date on all vaccines. This information is not intended to replace advice given to you by your health care provider. Make sure you discuss any questions you have with your health care provider. Document Released: 01/05/2016 Document Revised: 12/03/2018 Document Reviewed: 12/03/2018 Elsevier Patient Education  2020 [employer].

## 2019-12-03 ENCOUNTER — Telehealth: Payer: Self-pay

## 2019-12-03 DIAGNOSIS — K130 Diseases of lips: Secondary | ICD-10-CM

## 2019-12-03 NOTE — Telephone Encounter (Signed)
-----   Message from Colon Branch, MD sent at 12/02/2019  4:38 PM EST ----- Regarding: RE: dermatology referral Please enter referral ----- Message ----- From: [name], RN Sent: 12/02/2019  10:43 AM EST To: Colon Branch, MD, Damita Dunnings, CMA Subject: dermatology referral                           Pt reports he was referred to [facility] dermatology and they recently called him to cancel his appt and they can't get him in until late January. Pt requesting referral to [facility].

## 2019-12-03 NOTE — Telephone Encounter (Signed)
Referral placed.

## 2019-12-05 ENCOUNTER — Ambulatory Visit
Admission: RE | Admit: 2019-12-05 | Discharge: 2019-12-05 | Disposition: A | Discharge status: Home / Self Care | Payer: Medicare Other | Source: Ambulatory Visit | Attending: Neurology | Admitting: Neurology

## 2019-12-05 ENCOUNTER — Other Ambulatory Visit: Payer: Self-pay

## 2019-12-05 DIAGNOSIS — R1312 Dysphagia, oropharyngeal phase: Secondary | ICD-10-CM

## 2019-12-05 DIAGNOSIS — R531 Weakness: Secondary | ICD-10-CM | POA: Diagnosis not present

## 2019-12-05 DIAGNOSIS — R29898 Other symptoms and signs involving the musculoskeletal system: Secondary | ICD-10-CM

## 2019-12-06 ENCOUNTER — Telehealth: Payer: Self-pay

## 2019-12-06 DIAGNOSIS — R1312 Dysphagia, oropharyngeal phase: Secondary | ICD-10-CM

## 2019-12-06 DIAGNOSIS — R29898 Other symptoms and signs involving the musculoskeletal system: Secondary | ICD-10-CM

## 2019-12-06 MED ORDER — GABAPENTIN 300 MG PO CAPS: 300.0000 mg | ORAL_CAPSULE | Freq: Two times a day (BID) | ORAL | 2 refills | Status: DC

## 2019-12-06 NOTE — Telephone Encounter (Signed)
-----   Message from Pieter Partridge, DO sent at 12/06/2019 11:35 AM EST ----- MRI of brain looks unremarkable.  To further evaluate right hand weakness and oropharyngeal dysphagia, I would like to check MRI of cervical spine with and without contrast.

## 2019-12-06 NOTE — Telephone Encounter (Signed)
Called spoke with patient he was informed of results and understands.  New order for MRI C spine place Patient aware of location and to call if he has not heard from them by the end of the week.  He also need new Rx for gabapentin sent to mail order pharmacy. With increase dose he will run out soon.   Requested Prescriptions   Pending Prescriptions Disp Refills  . gabapentin (NEURONTIN) 300 MG capsule 180 capsule 2    Sig: Take 1 capsule (300 mg total) by mouth 2 (two) times daily.

## 2019-12-08 ENCOUNTER — Ambulatory Visit
Admission: RE | Admit: 2019-12-08 | Discharge: 2019-12-08 | Disposition: A | Discharge status: Home / Self Care | Payer: Medicare Other | Source: Ambulatory Visit | Attending: Neurology | Admitting: Neurology

## 2019-12-08 DIAGNOSIS — R1312 Dysphagia, oropharyngeal phase: Secondary | ICD-10-CM

## 2019-12-08 DIAGNOSIS — R29898 Other symptoms and signs involving the musculoskeletal system: Secondary | ICD-10-CM

## 2019-12-08 MED ORDER — GADOBENATE DIMEGLUMINE 529 MG/ML IV SOLN
18.0000 mL | Freq: Once | INTRAVENOUS | Status: AC | PRN
  Administered 2019-12-08: 18 mL via INTRAVENOUS

## 2019-12-09 ENCOUNTER — Telehealth: Payer: Self-pay

## 2019-12-09 DIAGNOSIS — R29898 Other symptoms and signs involving the musculoskeletal system: Secondary | ICD-10-CM

## 2019-12-09 NOTE — Telephone Encounter (Signed)
Called spoke with patient he was informed of results and understands.  He states that he has a EMG on 09/16/19 of the right upper ext. does he need another one?  He also wanted to update you that he has an appt with his neurosurgeon today.

## 2019-12-09 NOTE — Telephone Encounter (Signed)
No, I wouldn't repeat it.  If he is agreeable, I would refer to occupational therapy to help with the right hand weakness.

## 2019-12-09 NOTE — Telephone Encounter (Signed)
Spoke with patient he was informed of provider response and agrees to OT.  Referral place  Also sent information to patient via mychart

## 2019-12-09 NOTE — Telephone Encounter (Signed)
-----   Message from Pieter Partridge, DO sent at 12/09/2019  7:09 AM EST ----- There are some arthritic changes, some are likely affecting the nerves.  No explanation for swallowing problems.  For further evaluation of nerve damage in neck and hand, I would like to check NCV-EMG of right upper extremity.

## 2019-12-22 ENCOUNTER — Ambulatory Visit: Payer: Medicare Other | Attending: Neurology | Admitting: Occupational Therapy

## 2019-12-22 ENCOUNTER — Encounter: Payer: Self-pay | Admitting: Occupational Therapy

## 2019-12-22 ENCOUNTER — Other Ambulatory Visit: Payer: Self-pay

## 2019-12-22 DIAGNOSIS — R278 Other lack of coordination: Secondary | ICD-10-CM | POA: Diagnosis present

## 2019-12-22 DIAGNOSIS — M79641 Pain in right hand: Secondary | ICD-10-CM | POA: Diagnosis present
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
## 2019-12-23 ENCOUNTER — Other Ambulatory Visit: Payer: Self-pay

## 2019-12-23 ENCOUNTER — Telehealth: Payer: Self-pay

## 2019-12-23 DIAGNOSIS — R2689 Other abnormalities of gait and mobility: Secondary | ICD-10-CM

## 2019-12-23 NOTE — Telephone Encounter (Signed)
-----   Message from Alda Berthold, DO sent at 12/22/2019  2:39 PM EST ----- Please order PT for balance training as noted below.  Tomi Likens pt.   ----- Message ----- From: Rivka Barbara, OT Sent: 12/22/2019   2:31 PM EST To: Pieter Partridge, DO  Dr. Tomi Likens,  I saw this pt today for an OT eval. I think he would benefit from a PT eval to assess high level balance as well as home exercise program for LE's/back pain especially given how active this pt is.  If you agree could you please enter an order for PT eval and tx via epic and we can get him scheduled?  Thank you!Forde Radon, MS, OTR/L [employer]

## 2019-12-27 ENCOUNTER — Other Ambulatory Visit: Payer: Self-pay

## 2019-12-27 DIAGNOSIS — R2689 Other abnormalities of gait and mobility: Secondary | ICD-10-CM

## 2019-12-27 MED FILL — valACYclovir HCL 1 GM TABS: 1 | 8 days supply | Qty: 30 | Fill #1

## 2019-12-28 ENCOUNTER — Ambulatory Visit: Payer: Medicare Other | Attending: Neurology | Admitting: Occupational Therapy

## 2019-12-28 ENCOUNTER — Other Ambulatory Visit: Payer: Self-pay

## 2019-12-28 DIAGNOSIS — R2689 Other abnormalities of gait and mobility: Secondary | ICD-10-CM | POA: Diagnosis not present

## 2019-12-28 DIAGNOSIS — R278 Other lack of coordination: Secondary | ICD-10-CM | POA: Diagnosis not present

## 2019-12-28 DIAGNOSIS — R2681 Unsteadiness on feet: Secondary | ICD-10-CM | POA: Diagnosis not present

## 2019-12-28 DIAGNOSIS — M79641 Pain in right hand: Secondary | ICD-10-CM
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
## 2019-12-29 ENCOUNTER — Other Ambulatory Visit: Payer: Self-pay

## 2019-12-29 DIAGNOSIS — R29898 Other symptoms and signs involving the musculoskeletal system: Secondary | ICD-10-CM

## 2020-01-01 ENCOUNTER — Other Ambulatory Visit: Payer: Medicare Other

## 2020-01-03 ENCOUNTER — Encounter: Payer: Self-pay | Admitting: Physical Therapy

## 2020-01-03 ENCOUNTER — Ambulatory Visit: Payer: Medicare Other | Admitting: Physical Therapy

## 2020-01-03 ENCOUNTER — Ambulatory Visit: Payer: Medicare Other | Admitting: Occupational Therapy

## 2020-01-03 DIAGNOSIS — M79641 Pain in right hand: Secondary | ICD-10-CM | POA: Diagnosis not present

## 2020-01-03 DIAGNOSIS — R2681 Unsteadiness on feet: Secondary | ICD-10-CM | POA: Diagnosis not present

## 2020-01-03 DIAGNOSIS — R2689 Other abnormalities of gait and mobility: Secondary | ICD-10-CM | POA: Diagnosis not present

## 2020-01-03 DIAGNOSIS — R278 Other lack of coordination: Secondary | ICD-10-CM | POA: Diagnosis not present
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
## 2020-01-05 ENCOUNTER — Other Ambulatory Visit: Payer: Self-pay

## 2020-01-05 MED ORDER — GABAPENTIN 300 MG PO CAPS: 300.0000 mg | ORAL_CAPSULE | Freq: Two times a day (BID) | ORAL | 2 refills | Status: DC

## 2020-01-10 ENCOUNTER — Encounter: Payer: Self-pay | Admitting: Occupational Therapy

## 2020-01-10 ENCOUNTER — Ambulatory Visit: Payer: Medicare Other | Admitting: Occupational Therapy

## 2020-01-10 ENCOUNTER — Other Ambulatory Visit: Payer: Self-pay

## 2020-01-10 DIAGNOSIS — R2681 Unsteadiness on feet: Secondary | ICD-10-CM | POA: Diagnosis not present

## 2020-01-10 DIAGNOSIS — R278 Other lack of coordination: Secondary | ICD-10-CM

## 2020-01-10 DIAGNOSIS — R2689 Other abnormalities of gait and mobility: Secondary | ICD-10-CM | POA: Diagnosis not present

## 2020-01-10 DIAGNOSIS — M79641 Pain in right hand: Secondary | ICD-10-CM | POA: Diagnosis not present
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
## 2020-01-10 NOTE — Patient Instructions (Signed)
Access Code: [email or web address]  URL: [email or web address]  Date: 01/10/2020  Prepared by: Forde Radon   Exercises Isometric Wrist Extension Pronated - 5 reps - 1 sets - 5 seconds hold - 2x daily - 7x weekly Seated Isometric Wrist Flexion Neutral - 5 reps - 1 sets - 5 seconds hold - 2x daily - 7x weekly Seated Isometric Wrist Radial Deviation with Manual Resistance - 5 reps - 1 sets - 5 seconds hold - 2x daily - 7x weekly Seated Isometric Wrist Ulnar Deviation with Manual Resistance - 5 reps - 1 sets - 5 seconds hold - 2x daily - 7x weekly Thumb Abduction AROM on Table - 10 reps - 1 sets - 5 seconds hold - 2x daily - 7x weekly

## 2020-01-11 ENCOUNTER — Other Ambulatory Visit: Payer: Self-pay

## 2020-01-14 DIAGNOSIS — M180 Bilateral primary osteoarthritis of first carpometacarpal joints: Secondary | ICD-10-CM | POA: Diagnosis not present

## 2020-01-14 DIAGNOSIS — M25532 Pain in left wrist: Secondary | ICD-10-CM | POA: Diagnosis not present

## 2020-01-14 DIAGNOSIS — M25531 Pain in right wrist: Secondary | ICD-10-CM | POA: Diagnosis not present

## 2020-01-14 MED FILL — MELOXICAM 7.5 MG TABLET: 7.5 | 30 days supply | Qty: 30 | Fill #0

## 2020-01-25 ENCOUNTER — Ambulatory Visit: Payer: Medicare Other | Attending: Neurology | Admitting: Occupational Therapy

## 2020-01-25 ENCOUNTER — Other Ambulatory Visit: Payer: Self-pay

## 2020-01-25 DIAGNOSIS — M79641 Pain in right hand: Secondary | ICD-10-CM | POA: Diagnosis not present

## 2020-01-25 DIAGNOSIS — R278 Other lack of coordination: Secondary | ICD-10-CM | POA: Diagnosis not present
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
## 2020-01-25 NOTE — Patient Instructions (Signed)
  Coordination Activities  Perform the following activities for 10 minutes 1-2 times per day with both hand(s).   Rotate ball in fingertips (clockwise and counter-clockwise).  Toss ball between hands.  Deal cards with your thumb (Hold deck in hand and push card off top with thumb).  Rotate one card in hand (clockwise and counter-clockwise).  Pick up coins one at a time until you get 5 in your hand, then move coins from palm to fingertips to stack one at a time.

## 2020-01-28 DIAGNOSIS — D3709 Neoplasm of uncertain behavior of other specified sites of the oral cavity: Secondary | ICD-10-CM | POA: Diagnosis not present

## 2020-01-28 DIAGNOSIS — B370 Candidal stomatitis: Secondary | ICD-10-CM | POA: Diagnosis not present

## 2020-01-29 ENCOUNTER — Other Ambulatory Visit: Payer: Self-pay | Admitting: Internal Medicine

## 2020-02-01 ENCOUNTER — Other Ambulatory Visit: Payer: Self-pay

## 2020-02-01 ENCOUNTER — Ambulatory Visit: Payer: Medicare Other | Admitting: Occupational Therapy

## 2020-02-01 DIAGNOSIS — M79641 Pain in right hand: Secondary | ICD-10-CM

## 2020-02-01 DIAGNOSIS — R278 Other lack of coordination: Secondary | ICD-10-CM
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
## 2020-02-03 ENCOUNTER — Other Ambulatory Visit: Payer: Self-pay | Admitting: Internal Medicine

## 2020-02-23 ENCOUNTER — Encounter: Payer: Self-pay | Admitting: Occupational Therapy

## 2020-02-24 DIAGNOSIS — Z23 Encounter for immunization: Secondary | ICD-10-CM | POA: Diagnosis not present

## 2020-03-03 DIAGNOSIS — M180 Bilateral primary osteoarthritis of first carpometacarpal joints: Secondary | ICD-10-CM | POA: Diagnosis not present

## 2020-03-03 MED FILL — MELOXICAM 7.5 MG TABLET: 7.5 | 30 days supply | Qty: 30 | Fill #0

## 2020-03-07 DIAGNOSIS — Z6831 Body mass index (BMI) 31.0-31.9, adult: Secondary | ICD-10-CM | POA: Diagnosis not present

## 2020-03-07 DIAGNOSIS — I10 Essential (primary) hypertension: Secondary | ICD-10-CM | POA: Diagnosis not present

## 2020-03-07 DIAGNOSIS — M542 Cervicalgia: Secondary | ICD-10-CM | POA: Diagnosis not present

## 2020-03-07 DIAGNOSIS — M5440 Lumbago with sciatica, unspecified side: Secondary | ICD-10-CM | POA: Diagnosis not present

## 2020-03-07 MED FILL — FLUCONAZOLE 150 MG TABS: 150 | 2 days supply | Qty: 2 | Fill #0

## 2020-03-15 ENCOUNTER — Encounter: Payer: Self-pay | Admitting: Internal Medicine

## 2020-03-15 MED ORDER — VALACYCLOVIR HCL 1 G PO TABS: ORAL_TABLET | ORAL | 1 refills | Status: DC

## 2020-03-15 MED FILL — valACYclovir HCL 1 GM TABS: 1 | 8 days supply | Qty: 30 | Fill #0

## 2020-03-23 DIAGNOSIS — Z23 Encounter for immunization: Secondary | ICD-10-CM | POA: Diagnosis not present

## 2020-03-23 HISTORY — PX: MOUTH BIOPSY: SHX1012

## 2020-03-26 ENCOUNTER — Encounter: Payer: Self-pay | Admitting: Internal Medicine

## 2020-04-12 DIAGNOSIS — C001 Malignant neoplasm of external lower lip: Secondary | ICD-10-CM | POA: Diagnosis not present

## 2020-04-12 DIAGNOSIS — D3709 Neoplasm of uncertain behavior of other specified sites of the oral cavity: Secondary | ICD-10-CM | POA: Diagnosis not present

## 2020-04-12 MED FILL — CEPHALEXIN 500 MG CAPSULE: 500 | 7 days supply | Qty: 14 | Fill #0

## 2020-04-12 MED FILL — traMADol HCL 50 MG TABS: 50 | 3 days supply | Qty: 15 | Fill #0

## 2020-04-17 MED FILL — traMADol HCL 50 MG TABS: 50 | 5 days supply | Qty: 20 | Fill #0

## 2020-04-18 ENCOUNTER — Telehealth: Payer: Self-pay | Admitting: Internal Medicine

## 2020-04-18 DIAGNOSIS — Z202 Contact with and (suspected) exposure to infections with a predominantly sexual mode of transmission: Secondary | ICD-10-CM

## 2020-04-18 DIAGNOSIS — Z201 Contact with and (suspected) exposure to tuberculosis: Secondary | ICD-10-CM

## 2020-04-18 NOTE — Telephone Encounter (Signed)
Please enter orders for: RPR, TB Gold. DX syphilis and TB exposure

## 2020-04-18 NOTE — Telephone Encounter (Signed)
Orders placed.

## 2020-04-21 ENCOUNTER — Other Ambulatory Visit (INDEPENDENT_AMBULATORY_CARE_PROVIDER_SITE_OTHER): Payer: Medicare Other

## 2020-04-21 ENCOUNTER — Other Ambulatory Visit: Payer: Self-pay

## 2020-04-21 DIAGNOSIS — Z201 Contact with and (suspected) exposure to tuberculosis: Secondary | ICD-10-CM | POA: Diagnosis not present

## 2020-04-21 DIAGNOSIS — Z202 Contact with and (suspected) exposure to infections with a predominantly sexual mode of transmission: Secondary | ICD-10-CM

## 2020-04-23 LAB — QUANTIFERON-TB GOLD PLUS
Mitogen-NIL: >10 IU/mL
NIL: 0.02 IU/mL
QuantiFERON-TB Gold Plus: NEGATIVE
TB1-NIL: 0 IU/mL
TB2-NIL: <0 IU/mL

## 2020-04-24 LAB — RPR: RPR Ser Ql: NONREACTIVE

## 2020-04-30 ENCOUNTER — Other Ambulatory Visit: Payer: Self-pay | Admitting: Internal Medicine

## 2020-05-03 ENCOUNTER — Other Ambulatory Visit: Payer: Self-pay | Admitting: Internal Medicine

## 2020-05-04 ENCOUNTER — Encounter: Payer: Self-pay | Admitting: Internal Medicine

## 2020-05-04 ENCOUNTER — Other Ambulatory Visit: Payer: Self-pay | Admitting: Internal Medicine

## 2020-05-04 MED ORDER — TELMISARTAN 80 MG PO TABS: 80.0000 mg | ORAL_TABLET | Freq: Every day | ORAL | 0 refills | Status: DC

## 2020-05-18 ENCOUNTER — Other Ambulatory Visit: Payer: Self-pay

## 2020-05-18 ENCOUNTER — Ambulatory Visit (INDEPENDENT_AMBULATORY_CARE_PROVIDER_SITE_OTHER): Payer: Medicare Other | Admitting: Internal Medicine

## 2020-05-18 ENCOUNTER — Encounter: Payer: Self-pay | Admitting: Internal Medicine

## 2020-05-18 VITALS — BP 124/53 | HR 66 | Temp 96.8°F | Resp 18 | Ht 67.0 in | Wt 190.5 lb

## 2020-05-18 DIAGNOSIS — M899 Disorder of bone, unspecified: Secondary | ICD-10-CM | POA: Diagnosis not present

## 2020-05-18 DIAGNOSIS — F329 Major depressive disorder, single episode, unspecified: Secondary | ICD-10-CM | POA: Diagnosis not present

## 2020-05-18 DIAGNOSIS — I10 Essential (primary) hypertension: Secondary | ICD-10-CM

## 2020-05-18 DIAGNOSIS — Z8781 Personal history of (healed) traumatic fracture: Secondary | ICD-10-CM

## 2020-05-18 DIAGNOSIS — N5089 Other specified disorders of the male genital organs: Secondary | ICD-10-CM

## 2020-05-18 DIAGNOSIS — R1310 Dysphagia, unspecified: Secondary | ICD-10-CM

## 2020-05-18 DIAGNOSIS — M949 Disorder of cartilage, unspecified: Secondary | ICD-10-CM | POA: Diagnosis not present

## 2020-05-18 DIAGNOSIS — R739 Hyperglycemia, unspecified: Secondary | ICD-10-CM | POA: Diagnosis not present

## 2020-05-18 DIAGNOSIS — F32A Depression, unspecified: Secondary | ICD-10-CM

## 2020-05-18 LAB — CBC WITH DIFFERENTIAL/PLATELET
Basophils Absolute: 0.1 10*3/uL (ref 0.0–0.1)
Basophils Relative: 0.8 % (ref 0.0–3.0)
Eosinophils Absolute: 0.2 10*3/uL (ref 0.0–0.7)
Eosinophils Relative: 2.8 % (ref 0.0–5.0)
HCT: 41.6 % (ref 39.0–52.0)
Hemoglobin: 14.3 g/dL (ref 13.0–17.0)
Lymphocytes Relative: 18 % (ref 12.0–46.0)
Lymphs Abs: 1.6 10*3/uL (ref 0.7–4.0)
MCHC: 34.4 g/dL (ref 30.0–36.0)
MCV: 92 fl (ref 78.0–100.0)
Monocytes Absolute: 0.6 10*3/uL (ref 0.1–1.0)
Monocytes Relative: 6.8 % (ref 3.0–12.0)
Neutro Abs: 6.4 10*3/uL (ref 1.4–7.7)
Neutrophils Relative %: 71.6 % (ref 43.0–77.0)
Platelets: 229 10*3/uL (ref 150.0–400.0)
RBC: 4.53 Mil/uL (ref 4.22–5.81)
RDW: 12.9 % (ref 11.5–15.5)
WBC: 8.9 10*3/uL (ref 4.0–10.5)

## 2020-05-18 LAB — COMPREHENSIVE METABOLIC PANEL
ALT: 21 U/L (ref 0–53)
AST: 21 U/L (ref 0–37)
Albumin: 4.2 g/dL (ref 3.5–5.2)
Alkaline Phosphatase: 74 U/L (ref 39–117)
BUN: 13 mg/dL (ref 6–23)
CO2: 35 mEq/L — ABNORMAL HIGH (ref 19–32)
Calcium: 10.1 mg/dL (ref 8.4–10.5)
Chloride: 98 mEq/L (ref 96–112)
Creatinine, Ser: 0.95 mg/dL (ref 0.40–1.50)
GFR: 76.48 mL/min (ref >60.00)
Glucose, Bld: 142 mg/dL — ABNORMAL HIGH (ref 70–99)
Potassium: 4.4 mEq/L (ref 3.5–5.1)
Sodium: 136 mEq/L (ref 135–145)
Total Bilirubin: 0.8 mg/dL (ref 0.2–1.2)
Total Protein: 6 g/dL (ref 6.0–8.3)

## 2020-05-18 LAB — HEMOGLOBIN A1C: Hgb A1c MFr Bld: 6.6 % — ABNORMAL HIGH (ref 4.6–6.5)

## 2020-05-18 MED FILL — valACYclovir HCL 1 GM TABS: 1 | 8 days supply | Qty: 30 | Fill #1
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
## 2020-05-18 NOTE — Patient Instructions (Addendum)
Happy early Rudene Anda!   Continue checking your blood pressures BP GOAL is between 110/65 and  135/85. If it is consistently higher or lower, let me know    GO TO THE LAB : Get the blood work     [place], [place] back for a checkup in 4 months  To schedule the  bone density test call the following number [phone]

## 2020-05-18 NOTE — Progress Notes (Signed)
Pre visit review using our clinic review tool, if applicable. No additional management support is needed unless otherwise documented below in the visit note. 

## 2020-05-21 NOTE — Assessment & Plan Note (Signed)
Prediabetes: Check A1c HTN, excellent ambulatory BP readings, continue carvedilol, HCTZ, Micardis.  Check a CMP and CBC GERD, chronic dysphagia: Symptoms at baseline. DJD: Severe, mostly at the spine, quite limited by pain. MRI on 08-2019 showed mild chronic compression fracture at T12. Osteopenia: Last T score was 2015, he has history of compression fracture, he has a history of dysphagia, recommend DEXA.  Consider Prolia. Skin cancer: Patient reports surgery for removal of SCC from his lower lip in April. Depression: A lot of stress related to the health of his wife who he is nearly blind.  Patient is counseled. Preventive care: Reports he had a PNM 23 injection 2018. RTC 4 months

## 2020-06-02 DIAGNOSIS — M180 Bilateral primary osteoarthritis of first carpometacarpal joints: Secondary | ICD-10-CM | POA: Diagnosis not present

## 2020-06-05 ENCOUNTER — Ambulatory Visit (INDEPENDENT_AMBULATORY_CARE_PROVIDER_SITE_OTHER)
Admission: RE | Admit: 2020-06-05 | Discharge: 2020-06-05 | Disposition: A | Discharge status: Home / Self Care | Payer: Medicare Other | Source: Ambulatory Visit | Attending: Internal Medicine | Admitting: Internal Medicine

## 2020-06-05 ENCOUNTER — Other Ambulatory Visit: Payer: Self-pay

## 2020-06-05 DIAGNOSIS — M899 Disorder of bone, unspecified: Secondary | ICD-10-CM

## 2020-06-20 ENCOUNTER — Telehealth: Payer: Self-pay

## 2020-06-20 NOTE — Telephone Encounter (Signed)
Sent patient information to amgen portal. Waiting on Summary of benefits. Will schedule once received.

## 2020-06-20 NOTE — Telephone Encounter (Signed)
-----   Message from [place], [place] sent at 06/09/2020  7:51 AM EDT ----- Regarding: Lucie Leather would like to start Pt on Prolia- can you see how much he would owe (if anything) OOP?

## 2020-07-21 ENCOUNTER — Encounter: Payer: Self-pay | Admitting: Internal Medicine

## 2020-07-21 DIAGNOSIS — G8929 Other chronic pain: Secondary | ICD-10-CM | POA: Diagnosis not present

## 2020-07-21 DIAGNOSIS — M25511 Pain in right shoulder: Secondary | ICD-10-CM | POA: Diagnosis not present

## 2020-07-21 MED FILL — MELOXICAM 15 MG TABLET: 15 | 42 days supply | Qty: 42 | Fill #0

## 2020-07-22 ENCOUNTER — Other Ambulatory Visit: Payer: Self-pay | Admitting: Cardiovascular Disease

## 2020-08-01 ENCOUNTER — Other Ambulatory Visit: Payer: Self-pay | Admitting: Internal Medicine

## 2020-08-17 ENCOUNTER — Other Ambulatory Visit (HOSPITAL_BASED_OUTPATIENT_CLINIC_OR_DEPARTMENT_OTHER): Payer: Self-pay | Admitting: Neurosurgery

## 2020-08-17 ENCOUNTER — Other Ambulatory Visit: Payer: Self-pay | Admitting: Internal Medicine

## 2020-08-17 ENCOUNTER — Other Ambulatory Visit: Payer: Self-pay | Admitting: Neurosurgery

## 2020-08-17 DIAGNOSIS — M5440 Lumbago with sciatica, unspecified side: Secondary | ICD-10-CM

## 2020-08-17 MED FILL — METHYLPREDNISOLONE 4 MG TBP: 4 | 6 days supply | Qty: 21 | Fill #0

## 2020-08-17 MED FILL — traMADol HCL 50 MG TABS: 50 | 15 days supply | Qty: 60 | Fill #0

## 2020-08-19 ENCOUNTER — Other Ambulatory Visit: Payer: Self-pay | Admitting: Internal Medicine

## 2020-08-25 MED FILL — LIDOCAINE PATCH 5%: 5 | 30 days supply | Qty: 30 | Fill #0

## 2020-08-30 DIAGNOSIS — M545 Low back pain: Secondary | ICD-10-CM | POA: Diagnosis not present

## 2020-08-30 DIAGNOSIS — M5440 Lumbago with sciatica, unspecified side: Secondary | ICD-10-CM | POA: Diagnosis not present

## 2020-09-01 DIAGNOSIS — M7989 Other specified soft tissue disorders: Secondary | ICD-10-CM | POA: Diagnosis not present

## 2020-09-01 DIAGNOSIS — M5440 Lumbago with sciatica, unspecified side: Secondary | ICD-10-CM | POA: Diagnosis not present

## 2020-09-04 ENCOUNTER — Other Ambulatory Visit: Payer: Self-pay | Admitting: Neurosurgery

## 2020-09-04 DIAGNOSIS — M5440 Lumbago with sciatica, unspecified side: Secondary | ICD-10-CM

## 2020-09-07 ENCOUNTER — Ambulatory Visit
Admission: RE | Admit: 2020-09-07 | Discharge: 2020-09-07 | Disposition: A | Discharge status: Home / Self Care | Payer: Medicare Other | Source: Ambulatory Visit | Attending: Neurosurgery | Admitting: Neurosurgery

## 2020-09-07 ENCOUNTER — Other Ambulatory Visit: Payer: Self-pay | Admitting: Neurosurgery

## 2020-09-07 ENCOUNTER — Other Ambulatory Visit: Payer: Self-pay

## 2020-09-07 DIAGNOSIS — M5440 Lumbago with sciatica, unspecified side: Secondary | ICD-10-CM

## 2020-09-07 DIAGNOSIS — M4316 Spondylolisthesis, lumbar region: Secondary | ICD-10-CM | POA: Diagnosis not present

## 2020-09-07 DIAGNOSIS — M47892 Other spondylosis, cervical region: Secondary | ICD-10-CM | POA: Diagnosis not present

## 2020-09-07 DIAGNOSIS — M4326 Fusion of spine, lumbar region: Secondary | ICD-10-CM | POA: Diagnosis not present

## 2020-09-07 MED ORDER — IOPAMIDOL (ISOVUE-M 200) INJECTION 41%
1.0000 mL | Freq: Once | INTRAMUSCULAR | Status: AC
  Administered 2020-09-07: 1 mL via INTRA_ARTICULAR

## 2020-09-07 MED ORDER — METHYLPREDNISOLONE ACETATE 40 MG/ML INJ SUSP (RADIOLOG
120.0000 mg | Freq: Once | INTRAMUSCULAR | Status: AC
  Administered 2020-09-07: 120 mg via INTRA_ARTICULAR

## 2020-09-07 NOTE — Discharge Instructions (Signed)

## 2020-09-08 ENCOUNTER — Other Ambulatory Visit: Payer: Self-pay

## 2020-09-08 ENCOUNTER — Encounter: Payer: Self-pay | Admitting: Internal Medicine

## 2020-09-08 ENCOUNTER — Emergency Department (HOSPITAL_BASED_OUTPATIENT_CLINIC_OR_DEPARTMENT_OTHER)
Admission: EM | Admit: 2020-09-08 | Discharge: 2020-09-08 | Disposition: A | Discharge status: Home / Self Care | Payer: Medicare Other | Attending: Emergency Medicine | Admitting: Emergency Medicine

## 2020-09-08 ENCOUNTER — Encounter (HOSPITAL_BASED_OUTPATIENT_CLINIC_OR_DEPARTMENT_OTHER): Payer: Self-pay

## 2020-09-08 DIAGNOSIS — I10 Essential (primary) hypertension: Secondary | ICD-10-CM | POA: Diagnosis not present

## 2020-09-08 DIAGNOSIS — Z8673 Personal history of transient ischemic attack (TIA), and cerebral infarction without residual deficits: Secondary | ICD-10-CM | POA: Diagnosis not present

## 2020-09-08 DIAGNOSIS — Z87891 Personal history of nicotine dependence: Secondary | ICD-10-CM | POA: Diagnosis not present

## 2020-09-08 DIAGNOSIS — G309 Alzheimer's disease, unspecified: Secondary | ICD-10-CM | POA: Insufficient documentation

## 2020-09-08 DIAGNOSIS — M7989 Other specified soft tissue disorders: Secondary | ICD-10-CM

## 2020-09-08 DIAGNOSIS — R2242 Localized swelling, mass and lump, left lower limb: Secondary | ICD-10-CM | POA: Diagnosis not present

## 2020-09-08 DIAGNOSIS — Z79899 Other long term (current) drug therapy: Secondary | ICD-10-CM | POA: Insufficient documentation

## 2020-09-08 MED ORDER — ENOXAPARIN SODIUM 100 MG/ML ~~LOC~~ SOLN
1.0000 mg/kg | Freq: Once | SUBCUTANEOUS | Status: AC
  Administered 2020-09-08: 82.5 mg via SUBCUTANEOUS
  Filled 2020-09-08: qty 1

## 2020-09-08 NOTE — ED Notes (Signed)
Pt c/o r hip pain & increased edema to LLE. Palpitated distal LE pulses +2 =bilat
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
## 2020-09-08 NOTE — Discharge Instructions (Addendum)
You should be contacted by either the [facility] or the [facility] departments tomorrow morning to scheduled an ultrasound of your leg. If you do not hear from either by lunchtime please go to the [facility] ED.

## 2020-09-08 NOTE — ED Triage Notes (Signed)
Pt arrives with limp to ED, visible swelling to LLE, pt also reports chronic pain to right hip. Pt has concern for blood clot to left leg. Pt reports 4 epidural shots in back yesterday.

## 2020-09-09 ENCOUNTER — Ambulatory Visit (HOSPITAL_BASED_OUTPATIENT_CLINIC_OR_DEPARTMENT_OTHER)
Admission: RE | Admit: 2020-09-09 | Discharge: 2020-09-09 | Disposition: A | Discharge status: Home / Self Care | Payer: Medicare Other | Source: Ambulatory Visit | Attending: Emergency Medicine | Admitting: Emergency Medicine

## 2020-09-09 DIAGNOSIS — M7989 Other specified soft tissue disorders: Secondary | ICD-10-CM | POA: Insufficient documentation

## 2020-09-09 DIAGNOSIS — M79605 Pain in left leg: Secondary | ICD-10-CM | POA: Diagnosis not present

## 2020-09-09 DIAGNOSIS — M79662 Pain in left lower leg: Secondary | ICD-10-CM | POA: Diagnosis not present

## 2020-09-11 ENCOUNTER — Encounter: Payer: Self-pay | Admitting: Internal Medicine

## 2020-09-12 ENCOUNTER — Ambulatory Visit (INDEPENDENT_AMBULATORY_CARE_PROVIDER_SITE_OTHER): Payer: Medicare Other | Admitting: Cardiovascular Disease

## 2020-09-12 ENCOUNTER — Encounter: Payer: Self-pay | Admitting: Cardiovascular Disease

## 2020-09-12 ENCOUNTER — Other Ambulatory Visit: Payer: Self-pay

## 2020-09-12 VITALS — BP 172/80 | HR 55 | Ht 67.0 in | Wt 189.8 lb

## 2020-09-12 DIAGNOSIS — I779 Disorder of arteries and arterioles, unspecified: Secondary | ICD-10-CM | POA: Diagnosis not present

## 2020-09-12 DIAGNOSIS — I701 Atherosclerosis of renal artery: Secondary | ICD-10-CM

## 2020-09-12 DIAGNOSIS — I10 Essential (primary) hypertension: Secondary | ICD-10-CM | POA: Diagnosis not present

## 2020-09-12 DIAGNOSIS — R011 Cardiac murmur, unspecified: Secondary | ICD-10-CM

## 2020-09-12 DIAGNOSIS — E785 Hyperlipidemia, unspecified: Secondary | ICD-10-CM

## 2020-09-12 NOTE — Patient Instructions (Signed)
Medication Instructions:  No changes *If you need a refill on your cardiac medications before your next appointment, please call your pharmacy*   Lab Work: None ordered If you have labs (blood work) drawn today and your tests are completely normal, you will receive your results only by:  [place] (if you have MyChart) OR  A paper copy in the mail If you have any lab test that is abnormal or we need to change your treatment, we will call you to review the results.   Testing/Procedures: Your physician has requested that you have a carotid duplex. This test is an ultrasound of the carotid arteries in your neck. It looks at blood flow through these arteries that supply the brain with blood. Allow one hour for this exam. There are no restrictions or special instructions. This will take place at [place], [address].   Your physician has requested that you have a renal artery duplex. During this test, an ultrasound is used to evaluate blood flow to the kidneys. Take your medications as you usually do. This will take place at [place], [address].   No food after 11PM the night before.  Water is OK. (Don't drink liquids if you have been instructed not to for ANOTHER test).  Take two Extra-Strength Gas-X capsules at bedtime the night before test.   Take an additional two Extra-Strength Gas-X capsules three (3) hours before the test or first thing in the morning.    Avoid foods that produce bowel gas, for 24 hours prior to exam (see below).    No breakfast, no chewing gum, no smoking or carbonated beverages.  Patient may take morning medications with water.  Come in for test at least 15 minutes early to register    Follow-Up: At [facility], you and your health needs are our priority.  As part of our continuing mission to provide you with exceptional heart care, we have created designated Provider Care Teams.  These Care Teams include your primary  Cardiologist (physician) and Advanced Practice Providers (APPs -  Physician Assistants and Nurse Practitioners) who all work together to provide you with the care you need, when you need it.  We recommend signing up for the patient portal called "MyChart".  Sign up information is provided on this After Visit Summary.  MyChart is used to connect with patients for Virtual Visits (Telemedicine).  Patients are able to view lab/test results, encounter notes, upcoming appointments, etc.  Non-urgent messages can be sent to your provider as well.   To learn more about what you can do with MyChart, go to [email or web address].    Your next appointment:   12 month(s)  The format for your next appointment:   In Person  Provider:   Kathlyn Sacramento, MD
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
## 2020-09-13 ENCOUNTER — Other Ambulatory Visit: Payer: Medicare Other

## 2020-09-18 DIAGNOSIS — H353132 Nonexudative age-related macular degeneration, bilateral, intermediate dry stage: Secondary | ICD-10-CM | POA: Diagnosis not present

## 2020-09-18 DIAGNOSIS — H02834 Dermatochalasis of left upper eyelid: Secondary | ICD-10-CM | POA: Diagnosis not present

## 2020-09-18 DIAGNOSIS — Z961 Presence of intraocular lens: Secondary | ICD-10-CM | POA: Diagnosis not present

## 2020-09-18 DIAGNOSIS — H02831 Dermatochalasis of right upper eyelid: Secondary | ICD-10-CM | POA: Diagnosis not present

## 2020-09-19 DIAGNOSIS — M25551 Pain in right hip: Secondary | ICD-10-CM | POA: Diagnosis not present

## 2020-09-21 ENCOUNTER — Ambulatory Visit (HOSPITAL_COMMUNITY)
Admission: RE | Admit: 2020-09-21 | Discharge: 2020-09-21 | Disposition: A | Discharge status: Home / Self Care | Payer: Medicare Other | Source: Ambulatory Visit | Attending: Cardiology | Admitting: Cardiology

## 2020-09-21 ENCOUNTER — Other Ambulatory Visit: Payer: Self-pay

## 2020-09-21 ENCOUNTER — Ambulatory Visit: Payer: Medicare Other | Admitting: Internal Medicine

## 2020-09-21 ENCOUNTER — Other Ambulatory Visit (HOSPITAL_COMMUNITY): Payer: Self-pay | Admitting: Cardiovascular Disease

## 2020-09-21 DIAGNOSIS — I6521 Occlusion and stenosis of right carotid artery: Secondary | ICD-10-CM

## 2020-09-21 DIAGNOSIS — I6523 Occlusion and stenosis of bilateral carotid arteries: Secondary | ICD-10-CM

## 2020-09-21 DIAGNOSIS — I779 Disorder of arteries and arterioles, unspecified: Secondary | ICD-10-CM

## 2020-09-22 ENCOUNTER — Ambulatory Visit (HOSPITAL_COMMUNITY)
Admission: RE | Admit: 2020-09-22 | Discharge: 2020-09-22 | Disposition: A | Discharge status: Home / Self Care | Payer: Medicare Other | Source: Ambulatory Visit | Attending: Cardiovascular Disease | Admitting: Cardiovascular Disease

## 2020-09-22 DIAGNOSIS — I701 Atherosclerosis of renal artery: Secondary | ICD-10-CM | POA: Diagnosis not present

## 2020-09-27 ENCOUNTER — Ambulatory Visit (INDEPENDENT_AMBULATORY_CARE_PROVIDER_SITE_OTHER): Payer: Medicare Other | Admitting: Internal Medicine

## 2020-09-27 ENCOUNTER — Other Ambulatory Visit: Payer: Self-pay

## 2020-09-27 ENCOUNTER — Encounter: Payer: Self-pay | Admitting: Internal Medicine

## 2020-09-27 VITALS — BP 191/84 | HR 54 | Temp 98.0°F | Resp 16 | Ht 67.0 in | Wt 187.1 lb

## 2020-09-27 DIAGNOSIS — I701 Atherosclerosis of renal artery: Secondary | ICD-10-CM

## 2020-09-27 DIAGNOSIS — M810 Age-related osteoporosis without current pathological fracture: Secondary | ICD-10-CM

## 2020-09-27 DIAGNOSIS — H547 Unspecified visual loss: Secondary | ICD-10-CM

## 2020-09-27 DIAGNOSIS — M7989 Other specified soft tissue disorders: Secondary | ICD-10-CM

## 2020-09-27 DIAGNOSIS — I10 Essential (primary) hypertension: Secondary | ICD-10-CM

## 2020-09-27 DIAGNOSIS — R109 Unspecified abdominal pain: Secondary | ICD-10-CM

## 2020-09-27 DIAGNOSIS — Z23 Encounter for immunization: Secondary | ICD-10-CM

## 2020-09-27 NOTE — Telephone Encounter (Signed)
Spoke with patient a couple months ago, advised him he is ok to schedule prolia-he did tell me 3 months ago that he wished to discuss with PCP at upcoming visit prior to scheduling injection. Ok to schedule at [facility].

## 2020-09-27 NOTE — Progress Notes (Signed)
Pre visit review using our clinic review tool, if applicable. No additional management support is needed unless otherwise documented below in the visit note. 
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
## 2020-09-27 NOTE — Patient Instructions (Signed)
Check the  blood pressure regularly as you are doing. BP GOAL is between 110/65 and  135/85. If it is consistently higher or lower, let me know       GO TO THE LAB : Provide a urine sample   GO TO THE FRONT DESK, PLEASE SCHEDULE YOUR APPOINTMENTS Come back for a checkup in 3 months

## 2020-09-27 NOTE — Telephone Encounter (Signed)
Can you check on this please?

## 2020-09-27 NOTE — Assessment & Plan Note (Signed)
Here with multiple concerns. Poor vision: Complain of decreased vision in the right eye, under the care of ophthalmology per pt, wonder is related to gabapentin, I review up to date and that is be very unlikely. HTN: BP today is elevated, at home consistently 120/60.  Continue Micardis, Carvedilol, HCTZ.  Last BMP satisfactory. High cholesterol: On Lipitor and Zetia.  Last FLP satisfactory, recheck on RTC Osteoporosis: T score -2.2 on June 2021, has a chronic compression fracture, was recommended Prolia, will check if he did receive Prolia. Right flank pain: Started with right-sided abdominal pain that went to the hip and now to the groin.  No evidence of hernia on exam.  Will check UA urine culture to be sure. Swelling, left leg: Getting better, no evidence of cellulitis, gout.  [place] (-) for DVT, recommend leg elevation.  Call if not better Flu shot today RTC 3 months fasting.

## 2020-09-27 NOTE — Telephone Encounter (Signed)
Spoke w/ Pt- he is out grocery shopping, requests for me to schedule nurse visit and send him a message w/ date and time- he will call if he needs to reschedule.

## 2020-09-28 LAB — URINE CULTURE
MICRO NUMBER:: 11038894
Result:: NO GROWTH
SPECIMEN QUALITY:: ADEQUATE

## 2020-09-28 LAB — URINALYSIS, ROUTINE W REFLEX MICROSCOPIC
Bilirubin Urine: NEGATIVE
Glucose, UA: NEGATIVE
Hgb urine dipstick: NEGATIVE
Ketones, ur: NEGATIVE
Leukocytes,Ua: NEGATIVE
Nitrite: NEGATIVE
Protein, ur: NEGATIVE
Specific Gravity, Urine: 1.006 (ref 1.001–1.03)
pH: 7 (ref 5.0–8.0)

## 2020-09-29 ENCOUNTER — Other Ambulatory Visit: Payer: Self-pay | Admitting: Neurology

## 2020-10-03 ENCOUNTER — Ambulatory Visit: Payer: Medicare Other

## 2020-10-05 DIAGNOSIS — M25551 Pain in right hip: Secondary | ICD-10-CM | POA: Diagnosis not present

## 2020-10-13 ENCOUNTER — Encounter: Payer: Self-pay | Admitting: Internal Medicine

## 2020-10-17 DIAGNOSIS — I10 Essential (primary) hypertension: Secondary | ICD-10-CM | POA: Diagnosis not present

## 2020-10-17 DIAGNOSIS — M25551 Pain in right hip: Secondary | ICD-10-CM | POA: Diagnosis not present

## 2020-10-18 ENCOUNTER — Ambulatory Visit: Payer: Medicare Other

## 2020-10-23 ENCOUNTER — Other Ambulatory Visit: Payer: Self-pay | Admitting: Cardiovascular Disease

## 2020-11-05 DIAGNOSIS — Z23 Encounter for immunization: Secondary | ICD-10-CM | POA: Diagnosis not present

## 2020-11-06 ENCOUNTER — Encounter: Payer: Self-pay | Admitting: Internal Medicine

## 2020-11-07 ENCOUNTER — Ambulatory Visit (INDEPENDENT_AMBULATORY_CARE_PROVIDER_SITE_OTHER): Payer: Medicare Other | Admitting: Orthopaedic Surgery

## 2020-11-07 ENCOUNTER — Ambulatory Visit (INDEPENDENT_AMBULATORY_CARE_PROVIDER_SITE_OTHER): Payer: Medicare Other

## 2020-11-07 ENCOUNTER — Ambulatory Visit: Payer: Self-pay

## 2020-11-07 VITALS — Ht 67.0 in | Wt 187.0 lb

## 2020-11-07 DIAGNOSIS — M25551 Pain in right hip: Secondary | ICD-10-CM | POA: Diagnosis not present

## 2020-11-07 DIAGNOSIS — I701 Atherosclerosis of renal artery: Secondary | ICD-10-CM

## 2020-11-07 DIAGNOSIS — M1611 Unilateral primary osteoarthritis, right hip: Secondary | ICD-10-CM

## 2020-11-07 DIAGNOSIS — M7061 Trochanteric bursitis, right hip: Secondary | ICD-10-CM | POA: Diagnosis not present

## 2020-11-07 MED ORDER — METHYLPREDNISOLONE ACETATE 40 MG/ML IJ SUSP
40.0000 mg | INTRAMUSCULAR | Status: AC | PRN
  Administered 2020-11-07: 40 mg via INTRA_ARTICULAR

## 2020-11-07 MED ORDER — LIDOCAINE HCL 1 % IJ SOLN
3.0000 mL | INTRAMUSCULAR | Status: AC | PRN
  Administered 2020-11-07: 3 mL
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
## 2020-11-13 NOTE — Telephone Encounter (Signed)
error 

## 2020-11-14 DIAGNOSIS — B351 Tinea unguium: Secondary | ICD-10-CM | POA: Diagnosis not present

## 2020-11-16 ENCOUNTER — Other Ambulatory Visit: Payer: Self-pay | Admitting: Cardiovascular Disease

## 2020-11-20 NOTE — Telephone Encounter (Signed)
Rx request sent to pharmacy.  

## 2020-11-20 NOTE — Telephone Encounter (Signed)
Refill Request.  

## 2020-12-05 ENCOUNTER — Ambulatory Visit (INDEPENDENT_AMBULATORY_CARE_PROVIDER_SITE_OTHER): Payer: Medicare Other | Admitting: Orthopaedic Surgery

## 2020-12-05 ENCOUNTER — Encounter: Payer: Self-pay | Admitting: Orthopaedic Surgery

## 2020-12-05 DIAGNOSIS — M1611 Unilateral primary osteoarthritis, right hip: Secondary | ICD-10-CM

## 2020-12-05 DIAGNOSIS — I701 Atherosclerosis of renal artery: Secondary | ICD-10-CM | POA: Diagnosis not present

## 2020-12-05 DIAGNOSIS — M7061 Trochanteric bursitis, right hip: Secondary | ICD-10-CM

## 2020-12-05 DIAGNOSIS — M25551 Pain in right hip: Secondary | ICD-10-CM

## 2020-12-05 NOTE — Progress Notes (Signed)
The patient is a very pleasant 79 year old gentleman with known right hip trochanteric bursitis and right hip osteoarthritis.  He walks with a limp.  He says he is doing very well with the intra-articular injection that Dr. Junius Roads provided under ultrasound and the trochanteric injection that I provided on the same day.  He is actually heading to play golf after seeing [place] today.  He always has a limp and stiffness with that hip but he is not having any pain.  He is very satisfied.  He is someone who is not a surgical candidate due to other medical issues.  His hip actually moves on the right side just like the left side which is only some mild stiffness.  He still walks with a Trendelenburg gait but does not seem to have much pain today.  From my standpoint, follow-up is as needed.  He knows to wait at least 4 to 5 months between intra-articular injections in the hip.  If he starts having problems in the spring time he will let us know and call for an appointment.

## 2020-12-27 ENCOUNTER — Other Ambulatory Visit: Payer: Self-pay

## 2020-12-28 ENCOUNTER — Encounter: Payer: Self-pay | Admitting: Internal Medicine

## 2020-12-28 ENCOUNTER — Ambulatory Visit (INDEPENDENT_AMBULATORY_CARE_PROVIDER_SITE_OTHER): Payer: Medicare Other | Admitting: Internal Medicine

## 2020-12-28 ENCOUNTER — Other Ambulatory Visit: Payer: Self-pay

## 2020-12-28 VITALS — BP 180/90 | HR 56 | Temp 97.8°F | Resp 18 | Ht 67.0 in | Wt 186.0 lb

## 2020-12-28 DIAGNOSIS — E7800 Pure hypercholesterolemia, unspecified: Secondary | ICD-10-CM

## 2020-12-28 DIAGNOSIS — Z1159 Encounter for screening for other viral diseases: Secondary | ICD-10-CM

## 2020-12-28 DIAGNOSIS — R600 Localized edema: Secondary | ICD-10-CM

## 2020-12-28 DIAGNOSIS — R739 Hyperglycemia, unspecified: Secondary | ICD-10-CM | POA: Diagnosis not present

## 2020-12-28 DIAGNOSIS — I10 Essential (primary) hypertension: Secondary | ICD-10-CM | POA: Diagnosis not present

## 2020-12-28 LAB — LIPID PANEL
Cholesterol: 171 mg/dL (ref 0–200)
HDL: 43.2 mg/dL (ref >39.00)
LDL Cholesterol: 89 mg/dL (ref 0–99)
NonHDL: 128.02
Total CHOL/HDL Ratio: 4
Triglycerides: 194 mg/dL — ABNORMAL HIGH (ref 0.0–149.0)
VLDL: 38.8 mg/dL (ref 0.0–40.0)

## 2020-12-28 LAB — COMPREHENSIVE METABOLIC PANEL
ALT: 19 U/L (ref 0–53)
AST: 21 U/L (ref 0–37)
Albumin: 4.3 g/dL (ref 3.5–5.2)
Alkaline Phosphatase: 75 U/L (ref 39–117)
BUN: 11 mg/dL (ref 6–23)
CO2: 35 mEq/L — ABNORMAL HIGH (ref 19–32)
Calcium: 9.9 mg/dL (ref 8.4–10.5)
Chloride: 99 mEq/L (ref 96–112)
Creatinine, Ser: 0.91 mg/dL (ref 0.40–1.50)
GFR: 80.1 mL/min (ref >60.00)
Glucose, Bld: 104 mg/dL — ABNORMAL HIGH (ref 70–99)
Potassium: 4.9 mEq/L (ref 3.5–5.1)
Sodium: 138 mEq/L (ref 135–145)
Total Bilirubin: 0.7 mg/dL (ref 0.2–1.2)
Total Protein: 6.1 g/dL (ref 6.0–8.3)

## 2020-12-28 LAB — HEMOGLOBIN A1C: Hgb A1c MFr Bld: 5.8 % (ref 4.6–6.5)

## 2020-12-28 NOTE — Progress Notes (Signed)
Pre visit review using our clinic review tool, if applicable. No additional management support is needed unless otherwise documented below in the visit note. 

## 2020-12-28 NOTE — Patient Instructions (Addendum)
Check the  blood pressure regularly as you are doing. BP GOAL is between 110/65 and  135/85. If it is consistently higher or lower, let me know   Leg elevation at least 1 hour twice a day. Watch your salt intake  GO TO THE LAB : Get the blood work     GO TO THE FRONT DESK, PLEASE SCHEDULE YOUR APPOINTMENTS Come back for nurse visit in 2 weeks, bring your blood pressure machine  Come back for a checkup in 3 months
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
## 2020-12-29 DIAGNOSIS — R600 Localized edema: Secondary | ICD-10-CM | POA: Insufficient documentation

## 2020-12-29 LAB — HEPATITIS C ANTIBODY
Hepatitis C Ab: NONREACTIVE
SIGNAL TO CUT-OFF: 0 (ref <1.00)

## 2020-12-29 NOTE — Assessment & Plan Note (Signed)
Prediabetes: Check A1c. HTN: Reports good compliance with Carvedilol, HCTZ, Micardis.  BP today is elevated at 202/99.  Recheck: 180/90.  He is asx, checks amb BPs w/ a wrist cuff BID and all his readings are normal. Plan: CMP, continue monitoring, nurse visit in 2 weeks, bring his own cuff for comparison. High cholesterol: On Lipitor and Zetia, FLP today. Osteoporosis: Declined Prolia on October 2021 Leg edema: Worse on the left, chronic issue, recommend low-salt, leg elevation. Preventive care: Up-to-date on vaccinations. RTC nurse visit 2 weeks RTC office visit 3 months

## 2021-01-11 ENCOUNTER — Ambulatory Visit: Payer: Medicare Other

## 2021-01-11 ENCOUNTER — Other Ambulatory Visit: Payer: Self-pay | Admitting: Internal Medicine

## 2021-01-16 DIAGNOSIS — Z6829 Body mass index (BMI) 29.0-29.9, adult: Secondary | ICD-10-CM | POA: Diagnosis not present

## 2021-01-16 DIAGNOSIS — I10 Essential (primary) hypertension: Secondary | ICD-10-CM | POA: Diagnosis not present

## 2021-01-16 DIAGNOSIS — M7062 Trochanteric bursitis, left hip: Secondary | ICD-10-CM | POA: Diagnosis not present

## 2021-02-12 MED FILL — traMADol HCL 50 MG TABS: 50 | 15 days supply | Qty: 60 | Fill #1

## 2021-02-13 DIAGNOSIS — M5440 Lumbago with sciatica, unspecified side: Secondary | ICD-10-CM | POA: Diagnosis not present

## 2021-02-13 DIAGNOSIS — Z6828 Body mass index (BMI) 28.0-28.9, adult: Secondary | ICD-10-CM | POA: Diagnosis not present

## 2021-02-13 DIAGNOSIS — I10 Essential (primary) hypertension: Secondary | ICD-10-CM | POA: Diagnosis not present

## 2021-02-14 ENCOUNTER — Ambulatory Visit (INDEPENDENT_AMBULATORY_CARE_PROVIDER_SITE_OTHER): Payer: Medicare Other | Admitting: Orthopaedic Surgery

## 2021-02-14 ENCOUNTER — Encounter: Payer: Self-pay | Admitting: Orthopaedic Surgery

## 2021-02-14 DIAGNOSIS — M7062 Trochanteric bursitis, left hip: Secondary | ICD-10-CM | POA: Diagnosis not present

## 2021-02-14 MED ORDER — LIDOCAINE HCL 1 % IJ SOLN
3.0000 mL | INTRAMUSCULAR | Status: AC | PRN
  Administered 2021-02-14: 3 mL

## 2021-02-14 MED ORDER — METHYLPREDNISOLONE ACETATE 40 MG/ML IJ SUSP
40.0000 mg | INTRAMUSCULAR | Status: AC | PRN
  Administered 2021-02-14: 40 mg via INTRA_ARTICULAR

## 2021-02-14 NOTE — Progress Notes (Signed)
   Procedure Note  Patient: [name]             Date of Birth: 1941/07/06           MRN: [id number]             Visit Date: 02/14/2021  HPI [name] returns today complaining of left hip pain and weakness.  He saw Dr. Saintclair Halsted yesterday and is due to have injections in his back.  He had no injury.  He is requesting injection lateral aspect of his left hip.  He states when he lies on the hip or walks for prolonged period of time he has pain lateral aspect but no groin pain.  He states that the pain that he is having is similar to what he had on the right and that the injection for trochanteric bursitis and the intra-articular injection took away the right hip pain.  Review of systems: No fevers or chills.  Physical exam: Right hip excellent range of motion without pain left hip good range of motion with internal and external rotation tenderness over the left trochanteric region. Procedures: Visit Diagnoses:  1. Trochanteric bursitis, left hip     Large Joint Inj: L greater trochanter on 02/14/2021 10:31 AM Indications: pain Details: 22 G 1.5 in needle, lateral approach  Arthrogram: No  Medications: 3 mL lidocaine 1 %; 40 mg methylPREDNISolone acetate 40 MG/ML Outcome: tolerated well, no immediate complications Procedure, treatment alternatives, risks and benefits explained, specific risks discussed. Consent was given by the patient. Immediately prior to procedure a time out was called to verify the correct patient, procedure, equipment, support staff and site/side marked as required. Patient was prepped and draped in the usual sterile fashion.     Plan: He will follow up with [place] as needed needs to wait 3 to 4 months between injections for trochanteric bursitis.  Talked to him about stretching exercises for his IT band.  He may end up needing a intra-articular injection of the left hip he will call us and let us know.  Recommend he have the injections in his back prior to proceeding  with any type of intra-articular injection.

## 2021-02-27 DIAGNOSIS — M5116 Intervertebral disc disorders with radiculopathy, lumbar region: Secondary | ICD-10-CM | POA: Diagnosis not present

## 2021-02-27 DIAGNOSIS — Z79899 Other long term (current) drug therapy: Secondary | ICD-10-CM | POA: Diagnosis not present

## 2021-02-27 DIAGNOSIS — Z981 Arthrodesis status: Secondary | ICD-10-CM | POA: Diagnosis not present

## 2021-02-27 DIAGNOSIS — M4726 Other spondylosis with radiculopathy, lumbar region: Secondary | ICD-10-CM | POA: Diagnosis not present

## 2021-02-27 DIAGNOSIS — Z791 Long term (current) use of non-steroidal anti-inflammatories (NSAID): Secondary | ICD-10-CM | POA: Diagnosis not present

## 2021-02-27 DIAGNOSIS — M4807 Spinal stenosis, lumbosacral region: Secondary | ICD-10-CM | POA: Diagnosis not present

## 2021-02-27 DIAGNOSIS — I10 Essential (primary) hypertension: Secondary | ICD-10-CM | POA: Diagnosis not present

## 2021-03-20 ENCOUNTER — Other Ambulatory Visit (HOSPITAL_BASED_OUTPATIENT_CLINIC_OR_DEPARTMENT_OTHER): Payer: Self-pay | Admitting: Neurosurgery

## 2021-03-20 DIAGNOSIS — Z6828 Body mass index (BMI) 28.0-28.9, adult: Secondary | ICD-10-CM | POA: Diagnosis not present

## 2021-03-20 DIAGNOSIS — M7061 Trochanteric bursitis, right hip: Secondary | ICD-10-CM | POA: Diagnosis not present

## 2021-03-20 DIAGNOSIS — M5440 Lumbago with sciatica, unspecified side: Secondary | ICD-10-CM | POA: Diagnosis not present

## 2021-03-20 MED FILL — traMADol HCL 50 MG TABS: 50 | 15 days supply | Qty: 60 | Fill #0

## 2021-03-29 ENCOUNTER — Other Ambulatory Visit: Payer: Self-pay

## 2021-03-29 ENCOUNTER — Ambulatory Visit (INDEPENDENT_AMBULATORY_CARE_PROVIDER_SITE_OTHER): Payer: Medicare Other | Admitting: Internal Medicine

## 2021-03-29 ENCOUNTER — Encounter: Payer: Self-pay | Admitting: Internal Medicine

## 2021-03-29 VITALS — BP 126/76 | HR 78 | Temp 97.8°F | Resp 18 | Ht 67.0 in | Wt 184.0 lb

## 2021-03-29 DIAGNOSIS — E7800 Pure hypercholesterolemia, unspecified: Secondary | ICD-10-CM | POA: Diagnosis not present

## 2021-03-29 DIAGNOSIS — M8949 Other hypertrophic osteoarthropathy, multiple sites: Secondary | ICD-10-CM | POA: Diagnosis not present

## 2021-03-29 DIAGNOSIS — M159 Polyosteoarthritis, unspecified: Secondary | ICD-10-CM

## 2021-03-29 DIAGNOSIS — I10 Essential (primary) hypertension: Secondary | ICD-10-CM

## 2021-03-29 DIAGNOSIS — M150 Primary generalized (osteo)arthritis: Secondary | ICD-10-CM

## 2021-03-29 DIAGNOSIS — I739 Peripheral vascular disease, unspecified: Secondary | ICD-10-CM | POA: Diagnosis not present

## 2021-03-29 MED ORDER — VALACYCLOVIR HCL 1 G PO TABS: ORAL_TABLET | ORAL | 4 refills | Status: DC

## 2021-03-29 NOTE — Patient Instructions (Addendum)
Check the  blood pressure regularly as you are doing  BP GOAL is between 110/65 and  135/85. If it is consistently higher or lower, let me know   Continue same medications   GO TO THE FRONT DESK, PLEASE SCHEDULE YOUR APPOINTMENTS Come back for a checkup in 4 months

## 2021-03-29 NOTE — Assessment & Plan Note (Signed)
-  Tdap 2018 - PNM 23: x 3 , last 2018;  prevnar :2015 -COVID VAX x3 - s/p shingrex -CCS: h/o  several Cscopes due to polyps, Cscope 12-07, TICS , no polyp; Cscope 09-2011: 3 polyps; Colonoscopy 09-2018 , next per GI   -Prostate cancer screening:  PSAs done @ the [place] -brought a POA today

## 2021-03-29 NOTE — Assessment & Plan Note (Signed)
HTN:On carvedilol, Micardis, last BMP okay, ambulatory BPs are great between 114 and 138.  No change Hyperlipidemia: Well-controlled, on Lipitor and Zetia. DJD, chronic neck and back pain: This continue to be his main concern, uses a cane, no recent falls. Has nerve entrapment at the R elbow, under Dr. Assunta Curtis.  Has left hip pain worsening, to see Dr. Saintclair Halsted Valtrex: Refill sent Social: Wife is blind, he takes care of her, fortunately he remains in good spirits Preventive care reviewed RTC 4 months
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
## 2021-04-02 DIAGNOSIS — G5623 Lesion of ulnar nerve, bilateral upper limbs: Secondary | ICD-10-CM | POA: Diagnosis not present

## 2021-04-02 DIAGNOSIS — G5603 Carpal tunnel syndrome, bilateral upper limbs: Secondary | ICD-10-CM | POA: Diagnosis not present

## 2021-04-02 DIAGNOSIS — G629 Polyneuropathy, unspecified: Secondary | ICD-10-CM | POA: Diagnosis not present

## 2021-04-03 ENCOUNTER — Other Ambulatory Visit (HOSPITAL_BASED_OUTPATIENT_CLINIC_OR_DEPARTMENT_OTHER): Payer: Self-pay

## 2021-04-03 DIAGNOSIS — M542 Cervicalgia: Secondary | ICD-10-CM | POA: Diagnosis not present

## 2021-04-03 DIAGNOSIS — M5440 Lumbago with sciatica, unspecified side: Secondary | ICD-10-CM | POA: Diagnosis not present

## 2021-04-03 DIAGNOSIS — G5623 Lesion of ulnar nerve, bilateral upper limbs: Secondary | ICD-10-CM | POA: Diagnosis not present

## 2021-04-03 DIAGNOSIS — G629 Polyneuropathy, unspecified: Secondary | ICD-10-CM | POA: Diagnosis not present

## 2021-04-30 ENCOUNTER — Encounter: Payer: Self-pay | Admitting: Internal Medicine

## 2021-05-15 ENCOUNTER — Ambulatory Visit (INDEPENDENT_AMBULATORY_CARE_PROVIDER_SITE_OTHER): Payer: Medicare Other | Admitting: Cardiovascular Disease

## 2021-05-15 ENCOUNTER — Other Ambulatory Visit: Payer: Self-pay

## 2021-05-15 ENCOUNTER — Encounter: Payer: Self-pay | Admitting: Cardiovascular Disease

## 2021-05-15 VITALS — BP 142/72 | HR 58 | Ht 67.0 in | Wt 184.8 lb

## 2021-05-15 DIAGNOSIS — E7800 Pure hypercholesterolemia, unspecified: Secondary | ICD-10-CM

## 2021-05-15 DIAGNOSIS — R011 Cardiac murmur, unspecified: Secondary | ICD-10-CM | POA: Diagnosis not present

## 2021-05-15 DIAGNOSIS — I701 Atherosclerosis of renal artery: Secondary | ICD-10-CM

## 2021-05-15 MED ORDER — CARVEDILOL 3.125 MG PO TABS
3.1250 mg | ORAL_TABLET | Freq: Two times a day (BID) | ORAL | 3 refills | Status: DC
Start: 2021-05-15 — End: 2022-03-08

## 2021-05-15 MED ORDER — CARVEDILOL 3.125 MG PO TABS: 3.1250 mg | ORAL_TABLET | Freq: Two times a day (BID) | ORAL | 3 refills | Status: DC
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
## 2021-05-15 NOTE — Patient Instructions (Signed)
Medication Instructions:  DECREASE carvedilol (Coreg) 3.125 mg two times daily  *If you need a refill on your cardiac medications before your next appointment, please call your pharmacy*  Testing/Procedures: Your physician has requested that you have an echocardiogram. Echocardiography is a painless test that uses sound waves to create images of your heart. It provides your doctor with information about the size and shape of your heart and how well your heart's chambers and valves are working. This procedure takes approximately one hour. There are no restrictions for this procedure.  This will be done at our [facility] location:  [place]: At [employer], you and your health needs are our priority.  As part of our continuing mission to provide you with exceptional heart care, we have created designated Provider Care Teams.  These Care Teams include your primary Cardiologist (physician) and Advanced Practice Providers (APPs -  Physician Assistants and Nurse Practitioners) who all work together to provide you with the care you need, when you need it.  We recommend signing up for the patient portal called "MyChart".  Sign up information is provided on this After Visit Summary.  MyChart is used to connect with patients for Virtual Visits (Telemedicine).  Patients are able to view lab/test results, encounter notes, upcoming appointments, etc.  Non-urgent messages can be sent to your provider as well.   To learn more about what you can do with MyChart, go to [email or web address].    Your next appointment:   6 month(s)  The format for your next appointment:   In Person  Provider:   Kathlyn Sacramento, MD

## 2021-05-28 DIAGNOSIS — I701 Atherosclerosis of renal artery: Secondary | ICD-10-CM | POA: Diagnosis not present

## 2021-05-28 DIAGNOSIS — I6529 Occlusion and stenosis of unspecified carotid artery: Secondary | ICD-10-CM | POA: Diagnosis not present

## 2021-05-28 DIAGNOSIS — G5603 Carpal tunnel syndrome, bilateral upper limbs: Secondary | ICD-10-CM | POA: Diagnosis not present

## 2021-05-28 DIAGNOSIS — G5623 Lesion of ulnar nerve, bilateral upper limbs: Secondary | ICD-10-CM | POA: Diagnosis not present

## 2021-05-29 ENCOUNTER — Telehealth: Payer: Self-pay | Admitting: Cardiovascular Disease

## 2021-05-29 NOTE — Telephone Encounter (Signed)
   [facility] Pre-operative Risk Assessment    Patient Name: [name]  DOB: 03/29/1941  MRN: [id number]   HEARTCARE STAFF: - Please ensure there is not already an duplicate clearance open for this procedure. - Under Visit Info/Reason for Call, type in Other and utilize the format Clearance MM/DD/YY or Clearance TBD. Do not use dashes or single digits. - If request is for dental extraction, please clarify the # of teeth to be extracted. - If the patient is currently at the dentist's office, call Pre-Op APP to address. If the patient is not currently in the dentist office, please route to the Pre-Op pool  Request for surgical clearance:  1. What type of surgery is being performed? Right ulnar nerve release at the elbow with possible transposition - right carpal tunnel release    2. When is this surgery scheduled? TBD  3. What type of clearance is required (medical clearance vs. Pharmacy clearance to hold med vs. Both)? both  4. Are there any medications that need to be held prior to surgery and how long? Not listed, please advise if needed  5. Practice name and name of physician performing surgery? [facility] - Dr Roseanne Kaufman  6. What is the office phone number? [phone]   7.   What is the office fax number? [phone]  8.   Anesthesia type (None, local, MAC, general) ? Block with IV Sedation    Ace Gins 05/29/2021, 12:38 PM  _________________________________________________________________   (provider comments below)

## 2021-06-11 NOTE — Telephone Encounter (Signed)
Patient was recently seen by Dr. Fletcher Anon on 05/15/2021 and Echo was ordered for further evaluation of increased fatigue and murmur on exam. Echo is scheduled for tomorrow 06/12/2021. Will wait for results before providing final recommendations.  Darreld Mclean, PA-C 06/11/2021 9:14 AM

## 2021-06-12 ENCOUNTER — Ambulatory Visit (HOSPITAL_COMMUNITY): Payer: Medicare Other | Attending: Cardiovascular Disease

## 2021-06-12 ENCOUNTER — Other Ambulatory Visit: Payer: Self-pay

## 2021-06-12 DIAGNOSIS — R011 Cardiac murmur, unspecified: Secondary | ICD-10-CM | POA: Insufficient documentation

## 2021-06-12 LAB — ECHOCARDIOGRAM COMPLETE
AR max vel: 1.71 cm2
AV Area VTI: 1.34 cm2
AV Area mean vel: 1.65 cm2
AV Mean grad: 10 mmHg
AV Peak grad: 16.6 mmHg
Ao pk vel: 2.04 m/s
Area-P 1/2: 2.66 cm2
S' Lateral: 3.1 cm

## 2021-06-15 ENCOUNTER — Telehealth: Payer: Self-pay | Admitting: Cardiovascular Disease

## 2021-06-15 NOTE — Telephone Encounter (Signed)
Spoke with pt, aware of echo results. 

## 2021-06-15 NOTE — Telephone Encounter (Signed)
Patient is returning call to discuss echo results. °

## 2021-06-19 ENCOUNTER — Telehealth: Payer: Self-pay | Admitting: Cardiovascular Disease

## 2021-06-19 NOTE — Telephone Encounter (Signed)
Follow up:     Patient returning a call stating that some one called him. Please call patient back.

## 2021-06-19 NOTE — Telephone Encounter (Signed)
See mychart messages. Patient is aware he had a question in regards to clearance.

## 2021-06-19 NOTE — Telephone Encounter (Signed)
Left VM

## 2021-06-22 NOTE — Telephone Encounter (Signed)
   Primary Cardiologist: Kathlyn Sacramento, MD  Chart reviewed as part of pre-operative protocol coverage. Given past medical history and time since last visit, based on ACC/AHA guidelines, Adin A Spinello would be at acceptable risk for the planned procedure without further cardiovascular testing.   Patient was advised that if he develops new symptoms prior to surgery to contact our office to arrange a follow-up appointment.  He verbalized understanding.  I will route this recommendation to the requesting party via Epic fax function and remove from pre-op pool.  Please call with questions.  Jossie Ng. Estoria Geary NP-C    06/22/2021, 1:18 PM [facility] [place] [address] Office [phone] Fax [phone]

## 2021-06-26 ENCOUNTER — Other Ambulatory Visit (HOSPITAL_BASED_OUTPATIENT_CLINIC_OR_DEPARTMENT_OTHER): Payer: Self-pay

## 2021-06-26 MED FILL — Tramadol HCl Tab 50 MG: ORAL | 15 days supply | Qty: 60 | Fill #0 | Status: AC

## 2021-07-27 ENCOUNTER — Ambulatory Visit: Payer: Medicare Other | Attending: Internal Medicine

## 2021-07-27 DIAGNOSIS — Z23 Encounter for immunization: Secondary | ICD-10-CM

## 2021-07-27 NOTE — Progress Notes (Signed)
   Covid-19 Vaccination Clinic  Name:  [name]    MRN: [id number] DOB: 02/06/1941  07/27/2021  [name] was observed post Covid-19 immunization for 15 minutes without incident. He was provided with Vaccine Information Sheet and instruction to access the V-Safe system.   [name] was instructed to call 911 with any severe reactions post vaccine: Difficulty breathing  Swelling of face and throat  A fast heartbeat  A bad rash all over body  Dizziness and weakness   Immunizations Administered     Name Date Dose VIS Date Route   Moderna Covid-19 Booster Vaccine 07/27/2021  2:57 PM 0.25 mL 10/11/2020 Intramuscular   Manufacturer: Moderna   Lot: [id number]   [id number]

## 2021-07-30 ENCOUNTER — Encounter: Payer: Self-pay | Admitting: Internal Medicine

## 2021-07-30 ENCOUNTER — Other Ambulatory Visit: Payer: Self-pay

## 2021-07-30 ENCOUNTER — Ambulatory Visit (INDEPENDENT_AMBULATORY_CARE_PROVIDER_SITE_OTHER): Payer: Medicare Other | Admitting: Internal Medicine

## 2021-07-30 ENCOUNTER — Other Ambulatory Visit (HOSPITAL_BASED_OUTPATIENT_CLINIC_OR_DEPARTMENT_OTHER): Payer: Self-pay

## 2021-07-30 VITALS — BP 144/90 | HR 72 | Temp 98.0°F | Resp 16 | Ht 67.0 in | Wt 184.5 lb

## 2021-07-30 DIAGNOSIS — I701 Atherosclerosis of renal artery: Secondary | ICD-10-CM

## 2021-07-30 DIAGNOSIS — R739 Hyperglycemia, unspecified: Secondary | ICD-10-CM | POA: Diagnosis not present

## 2021-07-30 DIAGNOSIS — I350 Nonrheumatic aortic (valve) stenosis: Secondary | ICD-10-CM | POA: Diagnosis not present

## 2021-07-30 DIAGNOSIS — R5383 Other fatigue: Secondary | ICD-10-CM

## 2021-07-30 DIAGNOSIS — M8949 Other hypertrophic osteoarthropathy, multiple sites: Secondary | ICD-10-CM

## 2021-07-30 DIAGNOSIS — I10 Essential (primary) hypertension: Secondary | ICD-10-CM

## 2021-07-30 DIAGNOSIS — M159 Polyosteoarthritis, unspecified: Secondary | ICD-10-CM

## 2021-07-30 LAB — CBC WITH DIFFERENTIAL/PLATELET
Basophils Absolute: 0.1 10*3/uL (ref 0.0–0.1)
Basophils Relative: 1.3 % (ref 0.0–3.0)
Eosinophils Absolute: 0.4 10*3/uL (ref 0.0–0.7)
Eosinophils Relative: 4.4 % (ref 0.0–5.0)
HCT: 41.8 % (ref 39.0–52.0)
Hemoglobin: 14.5 g/dL (ref 13.0–17.0)
Lymphocytes Relative: 15.1 % (ref 12.0–46.0)
Lymphs Abs: 1.5 10*3/uL (ref 0.7–4.0)
MCHC: 34.8 g/dL (ref 30.0–36.0)
MCV: 88.8 fl (ref 78.0–100.0)
Monocytes Absolute: 0.8 10*3/uL (ref 0.1–1.0)
Monocytes Relative: 7.7 % (ref 3.0–12.0)
Neutro Abs: 7.1 10*3/uL (ref 1.4–7.7)
Neutrophils Relative %: 71.5 % (ref 43.0–77.0)
Platelets: 238 10*3/uL (ref 150.0–400.0)
RBC: 4.7 Mil/uL (ref 4.22–5.81)
RDW: 12.1 % (ref 11.5–15.5)
WBC: 10 10*3/uL (ref 4.0–10.5)

## 2021-07-30 LAB — BASIC METABOLIC PANEL
BUN: 11 mg/dL (ref 6–23)
CO2: 31 mEq/L (ref 19–32)
Calcium: 9.9 mg/dL (ref 8.4–10.5)
Chloride: 99 mEq/L (ref 96–112)
Creatinine, Ser: 0.93 mg/dL (ref 0.40–1.50)
GFR: 77.72 mL/min (ref >60.00)
Glucose, Bld: 118 mg/dL — ABNORMAL HIGH (ref 70–99)
Potassium: 4.5 mEq/L (ref 3.5–5.1)
Sodium: 138 mEq/L (ref 135–145)

## 2021-07-30 LAB — HEMOGLOBIN A1C: Hgb A1c MFr Bld: 7 % — ABNORMAL HIGH (ref 4.6–6.5)

## 2021-07-30 MED ORDER — TRAMADOL HCL 50 MG PO TABS
50.0000 mg | ORAL_TABLET | Freq: Two times a day (BID) | ORAL | 0 refills | Status: DC | PRN
  Filled 2021-07-30: qty 60, 30d supply, fill #0

## 2021-07-30 MED ORDER — COVID-19 MRNA VACC (MODERNA) 100 MCG/0.5ML IM SUSP
INTRAMUSCULAR | 0 refills | Status: DC
  Filled 2021-07-30: qty 0.25, 1d supply, fill #0

## 2021-07-30 NOTE — Patient Instructions (Addendum)
Check the  blood pressure as you are doing.  If you are having very frequent low blood pressures let me know. BP GOAL is between 110/65 and  135/85. If it is consistently higher or lower, let me know  I refilled tramadol.  Do not take it more than twice daily.    GO TO THE LAB : Get the blood work     [place], [place] back for   a checkup in 4 months, fasting
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
## 2021-07-30 NOTE — Assessment & Plan Note (Signed)
Prediabetes: Check A1c HTN: Continue carvedilol (per patient dose was reduced by cardiology) HCTZ, telmisartan.  Extensive ambulatory BPs reviewed, 7% in the low side, he does not have any particular sxs with  slightly low BP.  No change, check BMP and CBC. DJD, chronic neck and back pain. Patient has multiple MSK issues, request a refill on tramadol, Rx sent, to take it BID at most. 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 Caregiver fatigue, fatigue in general: Listening therapy provided, I think the fatigue is multifactorial. PHQ-9 is 11 but I don't think he is clinically depressed. Headaches: Restarted 6 weeks ago, to see optometry at the [place] today, he used to wear prismas and he is hoping that will help the issue.  He also has an appointment pending with [place] neurology Systolic murmur: Mild aortic stenosis per echo 05/2021.  Observation RTC 4 months

## 2021-08-06 ENCOUNTER — Telehealth: Payer: Self-pay | Admitting: Internal Medicine

## 2021-08-06 NOTE — Telephone Encounter (Signed)
Left message for patient to call back and schedule Medicare Annual Wellness Visit (AWV) in office.   If not able to come in office, please offer to do virtually or by telephone.  Left office number and my jabber [phone].  Last AWV:12/02/2019  Please schedule at anytime with Nurse Health Advisor.

## 2021-08-09 ENCOUNTER — Encounter: Payer: Self-pay | Admitting: Internal Medicine

## 2021-08-11 ENCOUNTER — Other Ambulatory Visit: Payer: Self-pay | Admitting: Cardiovascular Disease

## 2021-08-13 NOTE — Telephone Encounter (Signed)
Refill Request.  

## 2021-08-14 ENCOUNTER — Other Ambulatory Visit: Payer: Self-pay | Admitting: Internal Medicine

## 2021-08-26 ENCOUNTER — Other Ambulatory Visit: Payer: Self-pay | Admitting: Internal Medicine

## 2021-09-21 ENCOUNTER — Other Ambulatory Visit: Payer: Self-pay

## 2021-09-21 ENCOUNTER — Ambulatory Visit (HOSPITAL_COMMUNITY)
Admission: RE | Admit: 2021-09-21 | Discharge: 2021-09-21 | Disposition: A | Discharge status: Home / Self Care | Payer: Medicare Other | Source: Ambulatory Visit | Attending: Cardiology | Admitting: Cardiology

## 2021-09-21 DIAGNOSIS — I6523 Occlusion and stenosis of bilateral carotid arteries: Secondary | ICD-10-CM | POA: Insufficient documentation

## 2021-10-17 ENCOUNTER — Encounter: Payer: Self-pay | Admitting: Internal Medicine

## 2021-10-18 ENCOUNTER — Other Ambulatory Visit (HOSPITAL_BASED_OUTPATIENT_CLINIC_OR_DEPARTMENT_OTHER): Payer: Self-pay | Admitting: Cardiovascular Disease

## 2021-10-18 DIAGNOSIS — I771 Stricture of artery: Secondary | ICD-10-CM

## 2021-10-18 DIAGNOSIS — I6521 Occlusion and stenosis of right carotid artery: Secondary | ICD-10-CM

## 2021-10-25 LAB — LIPID PANEL
Cholesterol: 118 (ref 0–200)
HDL: 42 (ref 35–70)
Triglycerides: 112 (ref 40–160)

## 2021-10-25 LAB — HEPATIC FUNCTION PANEL
ALT: 25 (ref 10–40)
AST: 23 (ref 14–40)
Alkaline Phosphatase: 118 (ref 25–125)
Bilirubin, Direct: 0.2 (ref 0.01–0.4)
Bilirubin, Total: 0.6

## 2021-10-25 LAB — COMPREHENSIVE METABOLIC PANEL: Albumin: 3.8 (ref 3.5–5.0)

## 2021-10-25 LAB — VITAMIN D 25 HYDROXY (VIT D DEFICIENCY, FRACTURES): Vit D, 25-Hydroxy: 70.61

## 2021-11-12 ENCOUNTER — Other Ambulatory Visit: Payer: Self-pay | Admitting: Cardiovascular Disease

## 2021-11-12 NOTE — Telephone Encounter (Signed)
Refill request

## 2021-11-13 ENCOUNTER — Other Ambulatory Visit: Payer: Self-pay

## 2021-11-13 ENCOUNTER — Ambulatory Visit (INDEPENDENT_AMBULATORY_CARE_PROVIDER_SITE_OTHER): Payer: Medicare Other | Admitting: Cardiovascular Disease

## 2021-11-13 ENCOUNTER — Encounter: Payer: Self-pay | Admitting: Cardiovascular Disease

## 2021-11-13 VITALS — BP 184/81 | HR 63 | Ht 67.0 in | Wt 165.0 lb

## 2021-11-13 DIAGNOSIS — E785 Hyperlipidemia, unspecified: Secondary | ICD-10-CM

## 2021-11-13 DIAGNOSIS — I10 Essential (primary) hypertension: Secondary | ICD-10-CM

## 2021-11-13 DIAGNOSIS — I701 Atherosclerosis of renal artery: Secondary | ICD-10-CM | POA: Diagnosis not present

## 2021-11-13 NOTE — Patient Instructions (Signed)
Medication Instructions:  Your physician recommends that you continue on your current medications as directed. Please refer to the Current Medication list given to you today.  *If you need a refill on your cardiac medications before your next appointment, please call your pharmacy*   Follow-Up: At [facility], you and your health needs are our priority.  As part of our continuing mission to provide you with exceptional heart care, we have created designated Provider Care Teams.  These Care Teams include your primary Cardiologist (physician) and Advanced Practice Providers (APPs -  Physician Assistants and Nurse Practitioners) who all work together to provide you with the care you need, when you need it.  We recommend signing up for the patient portal called "MyChart".  Sign up information is provided on this After Visit Summary.  MyChart is used to connect with patients for Virtual Visits (Telemedicine).  Patients are able to view lab/test results, encounter notes, upcoming appointments, etc.  Non-urgent messages can be sent to your provider as well.   To learn more about what you can do with MyChart, go to [email or web address].    Your next appointment:   6 month(s)  The format for your next appointment:   In Person  Provider:   Kathlyn Sacramento, MD
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
## 2021-11-29 ENCOUNTER — Ambulatory Visit (HOSPITAL_BASED_OUTPATIENT_CLINIC_OR_DEPARTMENT_OTHER)
Admission: RE | Admit: 2021-11-29 | Discharge: 2021-11-29 | Disposition: A | Discharge status: Home / Self Care | Payer: Medicare Other | Source: Ambulatory Visit | Attending: Internal Medicine | Admitting: Internal Medicine

## 2021-11-29 ENCOUNTER — Other Ambulatory Visit: Payer: Self-pay

## 2021-11-29 ENCOUNTER — Ambulatory Visit (INDEPENDENT_AMBULATORY_CARE_PROVIDER_SITE_OTHER): Payer: Medicare Other | Admitting: Internal Medicine

## 2021-11-29 ENCOUNTER — Encounter: Payer: Self-pay | Admitting: Internal Medicine

## 2021-11-29 VITALS — BP 132/80 | HR 60 | Temp 98.2°F | Resp 16 | Ht 67.0 in | Wt 170.4 lb

## 2021-11-29 DIAGNOSIS — Z23 Encounter for immunization: Secondary | ICD-10-CM

## 2021-11-29 DIAGNOSIS — R739 Hyperglycemia, unspecified: Secondary | ICD-10-CM

## 2021-11-29 DIAGNOSIS — I701 Atherosclerosis of renal artery: Secondary | ICD-10-CM

## 2021-11-29 DIAGNOSIS — R634 Abnormal weight loss: Secondary | ICD-10-CM

## 2021-11-29 DIAGNOSIS — M159 Polyosteoarthritis, unspecified: Secondary | ICD-10-CM

## 2021-11-29 DIAGNOSIS — Z79899 Other long term (current) drug therapy: Secondary | ICD-10-CM

## 2021-11-29 DIAGNOSIS — I10 Essential (primary) hypertension: Secondary | ICD-10-CM

## 2021-11-29 LAB — BASIC METABOLIC PANEL
BUN: 11 mg/dL (ref 6–23)
CO2: 31 mEq/L (ref 19–32)
Calcium: 10.1 mg/dL (ref 8.4–10.5)
Chloride: 104 mEq/L (ref 96–112)
Creatinine, Ser: 0.83 mg/dL (ref 0.40–1.50)
GFR: 82.65 mL/min (ref >60.00)
Glucose, Bld: 101 mg/dL — ABNORMAL HIGH (ref 70–99)
Potassium: 5 mEq/L (ref 3.5–5.1)
Sodium: 141 mEq/L (ref 135–145)

## 2021-11-29 LAB — CBC WITH DIFFERENTIAL/PLATELET
Basophils Absolute: 0.1 10*3/uL (ref 0.0–0.1)
Basophils Relative: 0.9 % (ref 0.0–3.0)
Eosinophils Absolute: 0.2 10*3/uL (ref 0.0–0.7)
Eosinophils Relative: 2.2 % (ref 0.0–5.0)
HCT: 44.6 % (ref 39.0–52.0)
Hemoglobin: 14.7 g/dL (ref 13.0–17.0)
Lymphocytes Relative: 13.9 % (ref 12.0–46.0)
Lymphs Abs: 1.4 10*3/uL (ref 0.7–4.0)
MCHC: 32.9 g/dL (ref 30.0–36.0)
MCV: 90 fl (ref 78.0–100.0)
Monocytes Absolute: 0.6 10*3/uL (ref 0.1–1.0)
Monocytes Relative: 5.7 % (ref 3.0–12.0)
Neutro Abs: 7.9 10*3/uL — ABNORMAL HIGH (ref 1.4–7.7)
Neutrophils Relative %: 77.3 % — ABNORMAL HIGH (ref 43.0–77.0)
Platelets: 250 10*3/uL (ref 150.0–400.0)
RBC: 4.96 Mil/uL (ref 4.22–5.81)
RDW: 13.5 % (ref 11.5–15.5)
WBC: 10.2 10*3/uL (ref 4.0–10.5)

## 2021-11-29 LAB — HEMOGLOBIN A1C: Hgb A1c MFr Bld: 6.3 % (ref 4.6–6.5)

## 2021-11-29 LAB — TSH: TSH: 0.93 u[IU]/mL (ref 0.35–5.50)

## 2021-11-29 LAB — SEDIMENTATION RATE: Sed Rate: 3 mm/hr (ref 0–20)

## 2021-11-29 MED ORDER — TRAMADOL HCL 50 MG PO TABS: 50.0000 mg | ORAL_TABLET | Freq: Two times a day (BID) | ORAL | 0 refills | Status: DC | PRN

## 2021-11-29 MED ORDER — TELMISARTAN 40 MG PO TABS: 40.0000 mg | ORAL_TABLET | Freq: Every day | ORAL | 1 refills | Status: DC

## 2021-11-29 NOTE — Patient Instructions (Signed)
Check the  blood pressure  BP GOAL is between 110/65 and  135/85. If it is consistently higher or lower, let me know     GO TO THE LAB : Get the blood work     [place], [place] back for   a checkup in 3 months    STOP BY THE FIRST FLOOR:  get the XR
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
## 2021-11-29 NOTE — Assessment & Plan Note (Signed)
Labs from the [facility] October 25, 2021. Vitamin D normal.  UA negative. Albumin normal. Alkaline phosphate 118.  Slightly elevated. AST ALT okay Cholesterol 118, triglycerides 112. CBC TSH A1c sed rate Diabetes: Last A1c 7.0, diet controlled, check A1c HTN: Ambulatory BPs are either normal or slightly in the low side.  Continue carvedilol, hydrochlorothiazide, Micardis.  Check BMP Hyperlipidemia: [facility] very good. Weight loss: 30 pounds since the beginning of the year at the patient's own scale.  [place] doctor is already working with him, reportedly to have abdominal CT tomorrow to rule out malignancy. He has chronic headache, chronic pain, is difficult to ascertain if this could be PMR.  We will check, TSH sed rate and CBC and a chest x-ray If sed rate is elevated will consider rheumatology referral. PMR?:  See above. DJD: On tramadol, UDS today Headache: Ongoing, [facility], off gabapentin, on Topamax. Preventive care flu shot today.  Recommend COVID booster. RTC 3 months

## 2021-12-01 LAB — DRUG MONITORING PANEL 375977 , URINE

## 2021-12-01 LAB — DM TEMPLATE

## 2021-12-11 DIAGNOSIS — I10 Essential (primary) hypertension: Secondary | ICD-10-CM | POA: Diagnosis not present

## 2021-12-11 DIAGNOSIS — M542 Cervicalgia: Secondary | ICD-10-CM | POA: Diagnosis not present

## 2021-12-11 DIAGNOSIS — Z6826 Body mass index (BMI) 26.0-26.9, adult: Secondary | ICD-10-CM | POA: Diagnosis not present

## 2021-12-11 DIAGNOSIS — G959 Disease of spinal cord, unspecified: Secondary | ICD-10-CM | POA: Diagnosis not present

## 2021-12-18 ENCOUNTER — Other Ambulatory Visit: Payer: Self-pay

## 2021-12-18 MED ORDER — TELMISARTAN 40 MG PO TABS: 40.0000 mg | ORAL_TABLET | Freq: Every day | ORAL | 1 refills | Status: DC

## 2021-12-30 ENCOUNTER — Encounter: Payer: Self-pay | Admitting: Internal Medicine

## 2022-01-09 ENCOUNTER — Encounter: Payer: Self-pay | Admitting: Internal Medicine

## 2022-01-11 ENCOUNTER — Encounter: Payer: Self-pay | Admitting: Internal Medicine

## 2022-01-11 ENCOUNTER — Ambulatory Visit (INDEPENDENT_AMBULATORY_CARE_PROVIDER_SITE_OTHER): Payer: Medicare Other | Admitting: Internal Medicine

## 2022-01-11 VITALS — BP 106/64 | HR 84 | Temp 97.6°F | Resp 18 | Ht 67.0 in | Wt 171.0 lb

## 2022-01-11 DIAGNOSIS — I10 Essential (primary) hypertension: Secondary | ICD-10-CM

## 2022-01-11 DIAGNOSIS — R634 Abnormal weight loss: Secondary | ICD-10-CM

## 2022-01-11 DIAGNOSIS — M791 Myalgia, unspecified site: Secondary | ICD-10-CM

## 2022-01-11 DIAGNOSIS — M353 Polymyalgia rheumatica: Secondary | ICD-10-CM | POA: Diagnosis not present

## 2022-01-11 LAB — COMPREHENSIVE METABOLIC PANEL
ALT: 13 U/L (ref 0–53)
AST: 17 U/L (ref 0–37)
Albumin: 4.1 g/dL (ref 3.5–5.2)
Alkaline Phosphatase: 103 U/L (ref 39–117)
BUN: 22 mg/dL (ref 6–23)
CO2: 33 mEq/L — ABNORMAL HIGH (ref 19–32)
Calcium: 10.3 mg/dL (ref 8.4–10.5)
Chloride: 102 mEq/L (ref 96–112)
Creatinine, Ser: 0.96 mg/dL (ref 0.40–1.50)
GFR: 74.58 mL/min (ref >60.00)
Glucose, Bld: 111 mg/dL — ABNORMAL HIGH (ref 70–99)
Potassium: 4.2 mEq/L (ref 3.5–5.1)
Sodium: 141 mEq/L (ref 135–145)
Total Bilirubin: 0.5 mg/dL (ref 0.2–1.2)
Total Protein: 6.2 g/dL (ref 6.0–8.3)

## 2022-01-11 LAB — URINALYSIS, ROUTINE W REFLEX MICROSCOPIC
Bilirubin Urine: NEGATIVE
Hgb urine dipstick: NEGATIVE
Ketones, ur: NEGATIVE
Leukocytes,Ua: NEGATIVE
Nitrite: NEGATIVE
Specific Gravity, Urine: 1.015 (ref 1.000–1.030)
Total Protein, Urine: NEGATIVE
Urine Glucose: NEGATIVE
Urobilinogen, UA: 0.2 (ref 0.0–1.0)
WBC, UA: NONE SEEN (ref 0–?)
pH: 6.5 (ref 5.0–8.0)

## 2022-01-11 LAB — CK: Total CK: 40 U/L (ref 7–232)

## 2022-01-11 LAB — CBC WITH DIFFERENTIAL/PLATELET
Basophils Absolute: 0.1 10*3/uL (ref 0.0–0.1)
Basophils Relative: 0.7 % (ref 0.0–3.0)
Eosinophils Absolute: 0.3 10*3/uL (ref 0.0–0.7)
Eosinophils Relative: 2.6 % (ref 0.0–5.0)
HCT: 43.4 % (ref 39.0–52.0)
Hemoglobin: 14.3 g/dL (ref 13.0–17.0)
Lymphocytes Relative: 11.1 % — ABNORMAL LOW (ref 12.0–46.0)
Lymphs Abs: 1.3 10*3/uL (ref 0.7–4.0)
MCHC: 32.9 g/dL (ref 30.0–36.0)
MCV: 89.2 fl (ref 78.0–100.0)
Monocytes Absolute: 0.8 10*3/uL (ref 0.1–1.0)
Monocytes Relative: 6.7 % (ref 3.0–12.0)
Neutro Abs: 9.3 10*3/uL — ABNORMAL HIGH (ref 1.4–7.7)
Neutrophils Relative %: 78.9 % — ABNORMAL HIGH (ref 43.0–77.0)
Platelets: 285 10*3/uL (ref 150.0–400.0)
RBC: 4.86 Mil/uL (ref 4.22–5.81)
RDW: 13.9 % (ref 11.5–15.5)
WBC: 11.8 10*3/uL — ABNORMAL HIGH (ref 4.0–10.5)

## 2022-01-11 LAB — SEDIMENTATION RATE: Sed Rate: 4 mm/hr (ref 0–20)

## 2022-01-11 LAB — C-REACTIVE PROTEIN: CRP: 1.2 mg/dL (ref 0.5–20.0)

## 2022-01-11 NOTE — Assessment & Plan Note (Signed)
Generalized myalgias, arthralgias,?  PMR. The patient has extensive DJD,  chronic pain, has chronic headache, reports that acutely by mid December generalized arthralgias got worse particularly at the upper extremities.  Also developed generalized myalgias. Saw Dr. Saintclair Halsted, neurosurgery, PMR?Marland Kitchen  Was prescribed prednisone, symptoms improved temporarily. On exam there is no synovitis and he seems to be at baseline. Recent vitamin D and TSH normal. Many confounding factors make the diagnosis of PMR difficult. Plan: CK, CRP, sed rate, CMP, CBC, UA, ANA, RF. Refer to rheumatology Hold Lipitor for 3 weeks Seek medical attention if increased symptoms, fever or chills. HTN: BP is slightly low today, at home has been frequently in the 90s.  Plan: Continue carvedilol, hold HCTZ, check BPs, only restart HCTZ if BPs are consistently within normal range. Weight loss: Symptoms resolved, managed by the [place] doctors RTC already scheduled for March
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
## 2022-01-11 NOTE — Patient Instructions (Signed)
Your blood pressure is low. Hold hydrochlorothiazide. Only restart it if your blood pressure is more than 110/120 on regular basis.  Check the  blood pressure every day BP GOAL is between 110/65 and  135/85. If it is consistently higher or lower, let me know   Do not take atorvastatin for 3 weeks, see if that helps the pain. If you have increased symptoms, fever, chills: Seek medical attention   GO TO THE LAB : Get the blood work

## 2022-01-14 LAB — ANA: Anti Nuclear Antibody (ANA): NEGATIVE

## 2022-01-14 LAB — RHEUMATOID FACTOR: Rheumatoid fact SerPl-aCnc: <14 IU/mL (ref <14)

## 2022-01-15 ENCOUNTER — Ambulatory Visit: Payer: Medicare Other | Admitting: Internal Medicine

## 2022-01-18 ENCOUNTER — Other Ambulatory Visit: Payer: Self-pay | Admitting: Internal Medicine

## 2022-01-18 ENCOUNTER — Encounter: Payer: Self-pay | Admitting: Internal Medicine

## 2022-01-18 MED ORDER — OXYCODONE HCL 5 MG PO CAPS: 5.0000 mg | ORAL_CAPSULE | Freq: Three times a day (TID) | ORAL | 0 refills | Status: DC | PRN

## 2022-02-25 ENCOUNTER — Telehealth: Payer: Self-pay

## 2022-02-25 ENCOUNTER — Other Ambulatory Visit: Payer: Self-pay | Admitting: Internal Medicine

## 2022-02-25 MED ORDER — HYDROCHLOROTHIAZIDE 12.5 MG PO CAPS: ORAL_CAPSULE | ORAL | 1 refills | Status: DC

## 2022-02-25 NOTE — Telephone Encounter (Signed)
HCTZ was on hold due to low BP.  Please clarify with the patient, if the BP continues to be normal or in the low side, no need to RF HCTZ at any dose. ?

## 2022-02-25 NOTE — Telephone Encounter (Signed)
I refilled Pt's HCTZ 12.'5mg'$  this morning- per med list he is taking 1/2 tablet by mouth daily. Per [facility]- they are needing clarification because medication should not be split in half. Please advise.  ?

## 2022-02-28 ENCOUNTER — Encounter: Payer: Self-pay | Admitting: Internal Medicine

## 2022-02-28 ENCOUNTER — Ambulatory Visit (INDEPENDENT_AMBULATORY_CARE_PROVIDER_SITE_OTHER): Payer: Medicare Other | Admitting: Internal Medicine

## 2022-02-28 VITALS — BP 134/70 | HR 81 | Temp 98.0°F | Resp 16 | Ht 67.0 in | Wt 172.2 lb

## 2022-02-28 DIAGNOSIS — I10 Essential (primary) hypertension: Secondary | ICD-10-CM | POA: Diagnosis not present

## 2022-02-28 DIAGNOSIS — M159 Polyosteoarthritis, unspecified: Secondary | ICD-10-CM | POA: Diagnosis not present

## 2022-02-28 DIAGNOSIS — R935 Abnormal findings on diagnostic imaging of other abdominal regions, including retroperitoneum: Secondary | ICD-10-CM

## 2022-02-28 DIAGNOSIS — R634 Abnormal weight loss: Secondary | ICD-10-CM | POA: Diagnosis not present

## 2022-02-28 MED ORDER — TRAMADOL HCL 50 MG PO TABS: 50.0000 mg | ORAL_TABLET | Freq: Two times a day (BID) | ORAL | 0 refills | Status: DC | PRN

## 2022-02-28 NOTE — Assessment & Plan Note (Signed)
Here with his daughter [name] ?Weight loss: Weight has stabilized ?Abnormal CT abdomen and pelvis at the [facility] December 2022: +  calcification at the GE junction, they recommended GI evaluation.  GI referral sent. ?HTN: Ambulatory BPs very good, occasionally BP drops < 100s /50s.    HCTZ was d/c 02-25-2022, still on carvedilol, micardis; rec to continue checking, BP should never be less than 100, particularly if he has symptoms. ?Generalized myalgias, arthralgias, PMR?:  See last visit, blood work done,Sed rate normal, total CK normal, RF and ANA negative.  Symptoms are ongoing, reportedly severe associated with low energy.  Unable to sleep because when he turns, "something hurts". ?Oxycodone did not help.  DC oxycodone. ?The plan is to see rheumatology, he has an appointment for May.  He is already on the waiting list to see if he can be seen sooner. ?Reports that Dr. Saintclair Halsted prescribed prednisone temporarily for an unrelated issue and the generalized myalgias and arthralgias did not improve thus Fibromyalgia is  likely. ?Headaches: Ongoing, follow-up by neurology at the [facility], request tramadol which he takes rarely.  Sent. ?RTC 3 months ?
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
## 2022-02-28 NOTE — Patient Instructions (Addendum)
We are referring you to gastroenterology. ?If you do not hear from them soon, please call them: ?[phone] ? ? ?Check the  blood pressure regularly ?BP GOAL is between 100/65 and  135/85. ?If it is consistently higher or lower, let me know ? ?   ?GO TO THE FRONT DESK, PLEASE SCHEDULE YOUR APPOINTMENTS ?Come back for checkup in 3 months ?

## 2022-03-08 ENCOUNTER — Other Ambulatory Visit: Payer: Self-pay | Admitting: Cardiovascular Disease

## 2022-03-08 NOTE — Telephone Encounter (Signed)
Refill request

## 2022-03-15 ENCOUNTER — Ambulatory Visit (INDEPENDENT_AMBULATORY_CARE_PROVIDER_SITE_OTHER): Payer: Medicare Other | Admitting: Physician Assistant

## 2022-03-15 ENCOUNTER — Encounter: Payer: Self-pay | Admitting: Physician Assistant

## 2022-03-15 VITALS — BP 120/70 | HR 62 | Ht 67.0 in | Wt 168.0 lb

## 2022-03-15 DIAGNOSIS — K5900 Constipation, unspecified: Secondary | ICD-10-CM | POA: Diagnosis not present

## 2022-03-15 DIAGNOSIS — R634 Abnormal weight loss: Secondary | ICD-10-CM | POA: Diagnosis not present

## 2022-03-15 DIAGNOSIS — R1310 Dysphagia, unspecified: Secondary | ICD-10-CM | POA: Diagnosis not present

## 2022-03-15 DIAGNOSIS — R935 Abnormal findings on diagnostic imaging of other abdominal regions, including retroperitoneum: Secondary | ICD-10-CM | POA: Diagnosis not present

## 2022-03-15 DIAGNOSIS — R194 Change in bowel habit: Secondary | ICD-10-CM

## 2022-03-15 NOTE — Patient Instructions (Signed)
Start Miralax 1 capful daily in 8 ounces of liquid. ? ?You have been scheduled for an endoscopy and colonoscopy. Please follow the written instructions given to you at your visit today. ?Please pick up your prep supplies at the pharmacy within the next 1-3 days. ?If you use inhalers (even only as needed), please bring them with you on the day of your procedure. ? ?If you are age 81 or older, your body mass index should be between 23-30. Your Body mass index is 26.31 kg/m?Marland Kitchen If this is out of the aforementioned range listed, please consider follow up with your Primary Care Provider. ? ?If you are age 68 or younger, your body mass index should be between 19-25. Your Body mass index is 26.31 kg/m?Marland Kitchen If this is out of the aformentioned range listed, please consider follow up with your Primary Care Provider.  ? ?________________________________________________________ ? ?The [facility] providers would like to encourage you to use [facility] to communicate with providers for non-urgent requests or questions.  Due to long hold times on the telephone, sending your provider a message by [facility] may be a faster and more efficient way to get a response.  Please allow 48 business hours for a response.  Please remember that this is for non-urgent requests.  ?_______________________________________________________ ? ?
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
## 2022-03-19 NOTE — Progress Notes (Signed)
Assessment and plans reviewed  

## 2022-03-20 ENCOUNTER — Other Ambulatory Visit: Payer: Self-pay

## 2022-03-20 ENCOUNTER — Encounter: Payer: Self-pay | Admitting: Internal Medicine

## 2022-03-20 ENCOUNTER — Ambulatory Visit (AMBULATORY_SURGERY_CENTER): Payer: Medicare Other | Admitting: Internal Medicine

## 2022-03-20 VITALS — BP 148/72 | HR 63 | Temp 96.6°F | Resp 14 | Ht 67.0 in | Wt 168.0 lb

## 2022-03-20 DIAGNOSIS — K2970 Gastritis, unspecified, without bleeding: Secondary | ICD-10-CM | POA: Diagnosis not present

## 2022-03-20 DIAGNOSIS — R194 Change in bowel habit: Secondary | ICD-10-CM | POA: Diagnosis not present

## 2022-03-20 DIAGNOSIS — R634 Abnormal weight loss: Secondary | ICD-10-CM

## 2022-03-20 DIAGNOSIS — D123 Benign neoplasm of transverse colon: Secondary | ICD-10-CM

## 2022-03-20 DIAGNOSIS — R935 Abnormal findings on diagnostic imaging of other abdominal regions, including retroperitoneum: Secondary | ICD-10-CM

## 2022-03-20 DIAGNOSIS — K219 Gastro-esophageal reflux disease without esophagitis: Secondary | ICD-10-CM

## 2022-03-20 DIAGNOSIS — R1310 Dysphagia, unspecified: Secondary | ICD-10-CM

## 2022-03-20 DIAGNOSIS — K319 Disease of stomach and duodenum, unspecified: Secondary | ICD-10-CM | POA: Diagnosis not present

## 2022-03-20 DIAGNOSIS — R933 Abnormal findings on diagnostic imaging of other parts of digestive tract: Secondary | ICD-10-CM | POA: Diagnosis not present

## 2022-03-20 HISTORY — PX: COLONOSCOPY WITH ESOPHAGOGASTRODUODENOSCOPY (EGD): SHX5779

## 2022-03-20 MED ORDER — SODIUM CHLORIDE 0.9 % IV SOLN: 500.0000 mL | Freq: Once | INTRAVENOUS | Status: DC

## 2022-03-20 NOTE — Op Note (Signed)
[place] ?Patient Name: [name] ?Procedure Date: 03/20/2022 8:53 AM ?MRN: [id number] ?Endoscopist: Docia Chuck. Henrene Pastor , MD ?Age: 81 ?Referring MD:  ?Date of Birth: 1941-12-09 ?Gender: Male ?Account #: [id number] ?Procedure:                Upper GI endoscopy with biopsies ?Indications:              Esophageal reflux, Abnormal CT of the GI tract,  ?                          Weight loss ?Medicines:                Monitored Anesthesia Care ?Procedure:                Pre-Anesthesia Assessment: ?                          - Prior to the procedure, a History and Physical  ?                          was performed, and patient medications and  ?                          allergies were reviewed. The patient's tolerance of  ?                          previous anesthesia was also reviewed. The risks  ?                          and benefits of the procedure and the sedation  ?                          options and risks were discussed with the patient.  ?                          All questions were answered, and informed consent  ?                          was obtained. Prior Anticoagulants: The patient has  ?                          taken no previous anticoagulant or antiplatelet  ?                          agents. ASA Grade Assessment: II - A patient with  ?                          mild systemic disease. After reviewing the risks  ?                          and benefits, the patient was deemed in  ?                          satisfactory condition to undergo the procedure. ?  After obtaining informed consent, the endoscope was  ?                          passed under direct vision. Throughout the  ?                          procedure, the patient's blood pressure, pulse, and  ?                          oxygen saturations were monitored continuously. The  ?                          Endoscope was introduced through the mouth, and  ?                          advanced to the second part of  duodenum. The upper  ?                          GI endoscopy was accomplished without difficulty.  ?                          The patient tolerated the procedure well. ?Scope In: ?Scope Out: ?Findings:                 The esophagus was grossly normal. ?                          The stomach revealed evidence of prior  ?                          fundoplication with intact wrap. There was  ?                          nonspecific antral erythema. Biopsies were taken  ?                          with a cold forceps for histology. ?                          The examined duodenum was normal. ?                          The cardia and gastric fundus were normal on  ?                          retroflexion. ?Complications:            No immediate complications. ?Estimated Blood Loss:     Estimated blood loss: none. ?Impression:               1. Prior fundoplication intact ?                          2. Nonspecific mild antral erythema status post  ?                          biopsies ?  3. Otherwise normal EGD. ?Recommendation:           - Patient has a contact number available for  ?                          emergencies. The signs and symptoms of potential  ?                          delayed complications were discussed with the  ?                          patient. Return to normal activities tomorrow.  ?                          Written discharge instructions were provided to the  ?                          patient. ?                          - Resume previous diet. ?                          - Continue present medications. ?                          - Await pathology results. ?                          - Return to the care of your primary provider ?Docia Chuck. Henrene Pastor, MD ?03/20/2022 9:41:29 AM ?This report has been signed electronically. ?

## 2022-03-20 NOTE — Progress Notes (Signed)
VS by CW. ?

## 2022-03-20 NOTE — Patient Instructions (Signed)
Please read handouts provided. ?Continue present medications. ?Await pathology results. ?Return to care of primary provider. ? ? ?YOU HAD AN ENDOSCOPIC PROCEDURE TODAY AT [facility]:   Refer to the procedure report that was given to you for any specific questions about what was found during the examination.  If the procedure report does not answer your questions, please call your gastroenterologist to clarify.  If you requested that your care partner not be given the details of your procedure findings, then the procedure report has been included in a sealed envelope for you to review at your convenience later. ? ?YOU SHOULD EXPECT: Some feelings of bloating in the abdomen. Passage of more gas than usual.  Walking can help get rid of the air that was put into your GI tract during the procedure and reduce the bloating. If you had a lower endoscopy (such as a colonoscopy or flexible sigmoidoscopy) you may notice spotting of blood in your stool or on the toilet paper. If you underwent a bowel prep for your procedure, you may not have a normal bowel movement for a few days. ? ?Please Note:  You might notice some irritation and congestion in your nose or some drainage.  This is from the oxygen used during your procedure.  There is no need for concern and it should clear up in a day or so. ? ?SYMPTOMS TO REPORT IMMEDIATELY: ? ?Following lower endoscopy (colonoscopy or flexible sigmoidoscopy): ? Excessive amounts of blood in the stool ? Significant tenderness or worsening of abdominal pains ? Swelling of the abdomen that is new, acute ? Fever of 100?F or higher ? ?Following upper endoscopy (EGD) ? Vomiting of blood or coffee ground material ? New chest pain or pain under the shoulder blades ? Painful or persistently difficult swallowing ? New shortness of breath ? Fever of 100?F or higher ? Black, tarry-looking stools ? ?For urgent or emergent issues, a gastroenterologist can be reached at any hour by  calling [phone]. ?Do not use MyChart messaging for urgent concerns.  ? ? ?DIET:  We do recommend a small meal at first, but then you may proceed to your regular diet.  Drink plenty of fluids but you should avoid alcoholic beverages for 24 hours. ? ?ACTIVITY:  You should plan to take it easy for the rest of today and you should NOT DRIVE or use heavy machinery until tomorrow (because of the sedation medicines used during the test).   ? ?FOLLOW UP: ?Our staff will call the number listed on your records 48-72 hours following your procedure to check on you and address any questions or concerns that you may have regarding the information given to you following your procedure. If we do not reach you, we will leave a message.  We will attempt to reach you two times.  During this call, we will ask if you have developed any symptoms of COVID 19. If you develop any symptoms (ie: fever, flu-like symptoms, shortness of breath, cough etc.) before then, please call [phone].  If you test positive for Covid 19 in the 2 weeks post procedure, please call and report this information to [place].   ? ?If any biopsies were taken you will be contacted by phone or by letter within the next 1-3 weeks.  Please call us at [phone] if you have not heard about the biopsies in 3 weeks.  ? ? ?SIGNATURES/CONFIDENTIALITY: ?You and/or your care partner have signed paperwork which will be entered into your  electronic medical record.  These signatures attest to the fact that that the information above on your After Visit Summary has been reviewed and is understood.  Full responsibility of the confidentiality of this discharge information lies with you and/or your care-partner.  ?

## 2022-03-20 NOTE — Progress Notes (Signed)
Called to room to assist during endoscopic procedure.  Patient ID and intended procedure confirmed with present staff. Received instructions for my participation in the procedure from the performing physician.  

## 2022-03-20 NOTE — Progress Notes (Signed)
A and O x3. Report to RN. Tolerated MAC anesthesia well. Teeth unchanged after procedure.  ?

## 2022-03-20 NOTE — Op Note (Signed)
[place] ?Patient Name: [name] ?Procedure Date: 03/20/2022 8:54 AM ?MRN: [id number] ?Endoscopist: Docia Chuck. Henrene Pastor , MD ?Age: 81 ?Referring MD:  ?Date of Birth: 1941/11/28 ?Gender: Male ?Account #: [id number] ?Procedure:                Colonoscopy with cold snare polypectomy x 2 ?Indications:              High risk colon cancer surveillance: Personal  ?                          history of adenoma (10 mm or greater in size), High  ?                          risk colon cancer surveillance: Personal history of  ?                          adenoma with villous component, High risk colon  ?                          cancer surveillance: Personal history of multiple  ?                          (3 or more) adenomas. Previous examinations 2002,  ?                          2003, 2005, 2007, 2012, 2019. Also change in bowel  ?                          habits, constipation, weight loss ?Medicines:                Monitored Anesthesia Care ?Procedure:                Pre-Anesthesia Assessment: ?                          - Prior to the procedure, a History and Physical  ?                          was performed, and patient medications and  ?                          allergies were reviewed. The patient's tolerance of  ?                          previous anesthesia was also reviewed. The risks  ?                          and benefits of the procedure and the sedation  ?                          options and risks were discussed with the patient.  ?                          All questions were answered, and informed consent  ?  was obtained. Prior Anticoagulants: The patient has  ?                          taken no previous anticoagulant or antiplatelet  ?                          agents. ASA Grade Assessment: II - A patient with  ?                          mild systemic disease. After reviewing the risks  ?                          and benefits, the patient was deemed in  ?                           satisfactory condition to undergo the procedure. ?                          After obtaining informed consent, the colonoscope  ?                          was passed under direct vision. Throughout the  ?                          procedure, the patient's blood pressure, pulse, and  ?                          oxygen saturations were monitored continuously. The  ?                          Olympus CF-HQ190L (#[id number]) Colonoscope was  ?                          introduced through the anus and advanced to the the  ?                          cecum, identified by appendiceal orifice and  ?                          ileocecal valve. The ileocecal valve, appendiceal  ?                          orifice, and rectum were photographed. The quality  ?                          of the bowel preparation was excellent. The  ?                          colonoscopy was performed without difficulty. The  ?                          patient tolerated the procedure well. The bowel  ?  preparation used was SUPREP via split dose  ?                          instruction. ?Scope In: 9:10:32 AM ?Scope Out: 9:22:39 AM ?Scope Withdrawal Time: 0 hours 9 minutes 52 seconds  ?Total Procedure Duration: 0 hours 12 minutes 7 seconds  ?Findings:                 Multiple diverticula were found in the left colon.  ?                          Internal hemorrhoids were present ?                          Two polyps were found in the transverse colon. The  ?                          polyps were 3 mm in size. These polyps were removed  ?                          with a cold snare. Resection and retrieval were  ?                          complete. ?                          The exam was otherwise without abnormality on  ?                          direct and retroflexion views. ?Complications:            No immediate complications. Estimated blood loss:  ?                          None. ?Estimated Blood Loss:     Estimated blood loss:  none. ?Impression:               - Diverticulosis in the left colon. Internal  ?                          hemorrhoids. ?                          - Two 3 mm polyps in the transverse colon, removed  ?                          with a cold snare. Resected and retrieved. ?                          - The examination was otherwise normal on direct  ?                          and retroflexion views. ?Recommendation:           - Repeat colonoscopy is not recommended for  ?  surveillance. ?                          - Patient has a contact number available for  ?                          emergencies. The signs and symptoms of potential  ?                          delayed complications were discussed with the  ?                          patient. Return to normal activities tomorrow.  ?                          Written discharge instructions were provided to the  ?                          patient. ?                          - Resume previous diet. ?                          - Continue present medications. ?                          - Await pathology results. ?Docia Chuck. Henrene Pastor, MD ?03/20/2022 9:30:07 AM ?This report has been signed electronically. ?
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
## 2022-03-22 ENCOUNTER — Telehealth: Payer: Self-pay | Admitting: *Deleted

## 2022-03-22 ENCOUNTER — Encounter: Payer: Self-pay | Admitting: Internal Medicine

## 2022-03-22 NOTE — Telephone Encounter (Signed)
No answer for post procedure followup call. Left VM. ?

## 2022-03-22 NOTE — Telephone Encounter (Signed)
?  Follow up Call- ? ? ?  03/20/2022  ?  7:35 AM 10/22/2019  ?  1:47 PM  ?Call back number  ?Post procedure Call Back phone  # [phone]  ?Permission to leave phone message Yes Yes  ? LMOM to call back with any questions or concerns.   ?

## 2022-04-30 ENCOUNTER — Encounter: Payer: Self-pay | Admitting: Internal Medicine

## 2022-05-01 ENCOUNTER — Ambulatory Visit (INDEPENDENT_AMBULATORY_CARE_PROVIDER_SITE_OTHER): Payer: Medicare Other | Admitting: Internal Medicine

## 2022-05-01 ENCOUNTER — Encounter: Payer: Self-pay | Admitting: Internal Medicine

## 2022-05-01 VITALS — BP 168/84 | HR 76 | Temp 98.1°F | Resp 18 | Ht 67.0 in | Wt 172.0 lb

## 2022-05-01 DIAGNOSIS — M7989 Other specified soft tissue disorders: Secondary | ICD-10-CM | POA: Diagnosis not present

## 2022-05-01 DIAGNOSIS — R5383 Other fatigue: Secondary | ICD-10-CM | POA: Diagnosis not present

## 2022-05-01 DIAGNOSIS — E663 Overweight: Secondary | ICD-10-CM | POA: Diagnosis not present

## 2022-05-01 DIAGNOSIS — M2550 Pain in unspecified joint: Secondary | ICD-10-CM

## 2022-05-01 DIAGNOSIS — I10 Essential (primary) hypertension: Secondary | ICD-10-CM

## 2022-05-01 DIAGNOSIS — S0993XA Unspecified injury of face, initial encounter: Secondary | ICD-10-CM | POA: Diagnosis not present

## 2022-05-01 DIAGNOSIS — S0591XA Unspecified injury of right eye and orbit, initial encounter: Secondary | ICD-10-CM

## 2022-05-01 DIAGNOSIS — Z6826 Body mass index (BMI) 26.0-26.9, adult: Secondary | ICD-10-CM | POA: Diagnosis not present

## 2022-05-01 DIAGNOSIS — M064 Inflammatory polyarthropathy: Secondary | ICD-10-CM | POA: Diagnosis not present

## 2022-05-01 MED ORDER — HYDROCHLOROTHIAZIDE 12.5 MG PO TABS: 12.5000 mg | ORAL_TABLET | Freq: Every day | ORAL | 0 refills | Status: DC

## 2022-05-01 MED ORDER — TELMISARTAN 40 MG PO TABS
20.0000 mg | ORAL_TABLET | Freq: Every day | ORAL | Status: DC
Start: 2022-05-01 — End: 2022-05-29

## 2022-05-01 NOTE — Patient Instructions (Addendum)
Dr. Wyatt Portela- 05/02/22 at [facility].  ? ?[address] ?[place], [place] [address] ?Telephone: [phone] ? ?If he has severe eye symptoms: Seek medical attention immediately ? ?============ ? ?Micardis 40 mg: Take only half tablet daily ?Restart hydrochlorothiazide 12.5 mg: 1 tablet in the morning ? ?Monitor your blood pressure to be sure is okay ?BP GOAL is between 110/65 and  135/85. ?If it is consistently higher or lower, let me know ? ? ?Monitor your weight to be sure is going down few pounds in the next 3 weeks. ? ?Come back for blood work in 2 weeks ?  ?
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
## 2022-05-02 DIAGNOSIS — H5021 Vertical strabismus, right eye: Secondary | ICD-10-CM | POA: Diagnosis not present

## 2022-05-02 DIAGNOSIS — S0511XA Contusion of eyeball and orbital tissues, right eye, initial encounter: Secondary | ICD-10-CM | POA: Diagnosis not present

## 2022-05-02 DIAGNOSIS — Z961 Presence of intraocular lens: Secondary | ICD-10-CM | POA: Diagnosis not present

## 2022-05-02 DIAGNOSIS — H02831 Dermatochalasis of right upper eyelid: Secondary | ICD-10-CM | POA: Diagnosis not present

## 2022-05-02 DIAGNOSIS — H5010 Unspecified exotropia: Secondary | ICD-10-CM | POA: Diagnosis not present

## 2022-05-02 DIAGNOSIS — H02834 Dermatochalasis of left upper eyelid: Secondary | ICD-10-CM | POA: Diagnosis not present

## 2022-05-02 DIAGNOSIS — H353132 Nonexudative age-related macular degeneration, bilateral, intermediate dry stage: Secondary | ICD-10-CM | POA: Diagnosis not present

## 2022-05-02 NOTE — Assessment & Plan Note (Signed)
HTN: ?Ambulatory BPs 137, 112, 122, 147, 139, 117, 140, 148/75. ?They do get up and down over time but they are not dramatically high. ?He has gained some weight , chronic lower extremity edema (L>R ) is worse. ?On 02/25/2022 HCTZ was discontinued due to low BP. ?Plan: ?Decrease Micardis from 40 mg to 20 mg. ?Restart low-dose of HCTZ: 12.5 mg daily. ?Continue carvedilol ?BMP in 2 weeks ?Monitor BPs ?Facial injury: As described above, I am concerned about the amount of edema at the right conjunctiva.  I do not suspect a major injury of the eyeball but rec to see ophthalmology tomorrow, visit arranged, states he will keep it.  Seek medical attention sooner if difficulty with his vision. ?Generalized myalgias, arthralgias, PMR? ?Saw rheumatology today, note reviewed, was felt to have polyarthritis, work-up thus far negative, they order a number of additional tests, Rx prednisone to see a response. ?RTC 3 months ?

## 2022-05-06 ENCOUNTER — Ambulatory Visit: Payer: Medicare Other

## 2022-05-07 ENCOUNTER — Ambulatory Visit (INDEPENDENT_AMBULATORY_CARE_PROVIDER_SITE_OTHER): Payer: Medicare Other

## 2022-05-07 DIAGNOSIS — Z0000 Encounter for general adult medical examination without abnormal findings: Secondary | ICD-10-CM | POA: Diagnosis not present
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
## 2022-05-07 NOTE — Patient Instructions (Signed)
[name] , ?Thank you for taking time to come for your Medicare Wellness Visit. I appreciate your ongoing commitment to your health goals. Please review the following plan we discussed and let me know if I can assist you in the future.  ? ?Screening recommendations/referrals: ?Colonoscopy: 03/20/22 N/A ?Recommended yearly ophthalmology/optometry visit for glaucoma screening and checkup ?Recommended yearly dental visit for hygiene and checkup ? ?Vaccinations: ?Influenza vaccine: up to date ?Pneumococcal vaccine: up to date ?Tdap vaccine: up to date ?Shingles vaccine: Due-May obtain vaccine at our office or your local pharmacy.    ?Covid-19: Due-May obtain vaccine at our office or your local pharmacy.  ? ?Advanced directives: yes, on file ? ?Conditions/risks identified: see problem list  ? ?Next appointment: Follow up in one year for your annual wellness visit.  ? ?Preventive Care 1 Years and Older, Male ?Preventive care refers to lifestyle choices and visits with your health care provider that can promote health and wellness. ?What does preventive care include? ?A yearly physical exam. This is also called an annual well check. ?Dental exams once or twice a year. ?Routine eye exams. Ask your health care provider how often you should have your eyes checked. ?Personal lifestyle choices, including: ?Daily care of your teeth and gums. ?Regular physical activity. ?Eating a healthy diet. ?Avoiding tobacco and drug use. ?Limiting alcohol use. ?Practicing safe sex. ?Taking low doses of aspirin every day. ?Taking vitamin and mineral supplements as recommended by your health care provider. ?What happens during an annual well check? ?The services and screenings done by your health care provider during your annual well check will depend on your age, overall health, lifestyle risk factors, and family history of disease. ?Counseling  ?Your health care provider may ask you questions about your: ?Alcohol use. ?Tobacco use. ?Drug  use. ?Emotional well-being. ?Home and relationship well-being. ?Sexual activity. ?Eating habits. ?History of falls. ?Memory and ability to understand (cognition). ?Work and work Statistician. ?Screening  ?You may have the following tests or measurements: ?Height, weight, and BMI. ?Blood pressure. ?Lipid and cholesterol levels. These may be checked every 5 years, or more frequently if you are over 84 years old. ?Skin check. ?Lung cancer screening. You may have this screening every year starting at age 35 if you have a 30-pack-year history of smoking and currently smoke or have quit within the past 15 years. ?Fecal occult blood test (FOBT) of the stool. You may have this test every year starting at age 30. ?Flexible sigmoidoscopy or colonoscopy. You may have a sigmoidoscopy every 5 years or a colonoscopy every 10 years starting at age 56. ?Prostate cancer screening. Recommendations will vary depending on your family history and other risks. ?Hepatitis C blood test. ?Hepatitis B blood test. ?Sexually transmitted disease (STD) testing. ?Diabetes screening. This is done by checking your blood sugar (glucose) after you have not eaten for a while (fasting). You may have this done every 1-3 years. ?Abdominal aortic aneurysm (AAA) screening. You may need this if you are a current or former smoker. ?Osteoporosis. You may be screened starting at age 39 if you are at high risk. ?Talk with your health care provider about your test results, treatment options, and if necessary, the need for more tests. ?Vaccines  ?Your health care provider may recommend certain vaccines, such as: ?Influenza vaccine. This is recommended every year. ?Tetanus, diphtheria, and acellular pertussis (Tdap, Td) vaccine. You may need a Td booster every 10 years. ?Zoster vaccine. You may need this after age 22. ?Pneumococcal 13-valent conjugate (  PCV13) vaccine. One dose is recommended after age 17. ?Pneumococcal polysaccharide (PPSV23) vaccine. One dose is  recommended after age 66. ?Talk to your health care provider about which screenings and vaccines you need and how often you need them. ?This information is not intended to replace advice given to you by your health care provider. Make sure you discuss any questions you have with your health care provider. ?Document Released: 01/05/2016 Document Revised: 08/28/2016 Document Reviewed: 10/10/2015 ?Elsevier Interactive Patient Education ? 2017 [employer]. ? ?Fall Prevention in the Home ?Falls can cause injuries. They can happen to people of all ages. There are many things you can do to make your home safe and to help prevent falls. ?What can I do on the outside of my home? ?Regularly fix the edges of walkways and driveways and fix any cracks. ?Remove anything that might make you trip as you walk through a door, such as a raised step or threshold. ?Trim any bushes or trees on the path to your home. ?Use bright outdoor lighting. ?Clear any walking paths of anything that might make someone trip, such as rocks or tools. ?Regularly check to see if handrails are loose or broken. Make sure that both sides of any steps have handrails. ?Any raised decks and porches should have guardrails on the edges. ?Have any leaves, snow, or ice cleared regularly. ?Use sand or salt on walking paths during winter. ?Clean up any spills in your garage right away. This includes oil or grease spills. ?What can I do in the bathroom? ?Use night lights. ?Install grab bars by the toilet and in the tub and shower. Do not use towel bars as grab bars. ?Use non-skid mats or decals in the tub or shower. ?If you need to sit down in the shower, use a plastic, non-slip stool. ?Keep the floor dry. Clean up any water that spills on the floor as soon as it happens. ?Remove soap buildup in the tub or shower regularly. ?Attach bath mats securely with double-sided non-slip rug tape. ?Do not have throw rugs and other things on the floor that can make you  trip. ?What can I do in the bedroom? ?Use night lights. ?Make sure that you have a light by your bed that is easy to reach. ?Do not use any sheets or blankets that are too big for your bed. They should not hang down onto the floor. ?Have a firm chair that has side arms. You can use this for support while you get dressed. ?Do not have throw rugs and other things on the floor that can make you trip. ?What can I do in the kitchen? ?Clean up any spills right away. ?Avoid walking on wet floors. ?Keep items that you use a lot in easy-to-reach places. ?If you need to reach something above you, use a strong step stool that has a grab bar. ?Keep electrical cords out of the way. ?Do not use floor polish or wax that makes floors slippery. If you must use wax, use non-skid floor wax. ?Do not have throw rugs and other things on the floor that can make you trip. ?What can I do with my stairs? ?Do not leave any items on the stairs. ?Make sure that there are handrails on both sides of the stairs and use them. Fix handrails that are broken or loose. Make sure that handrails are as long as the stairways. ?Check any carpeting to make sure that it is firmly attached to the stairs. Fix any carpet that is  loose or worn. ?Avoid having throw rugs at the top or bottom of the stairs. If you do have throw rugs, attach them to the floor with carpet tape. ?Make sure that you have a light switch at the top of the stairs and the bottom of the stairs. If you do not have them, ask someone to add them for you. ?What else can I do to help prevent falls? ?Wear shoes that: ?Do not have high heels. ?Have rubber bottoms. ?Are comfortable and fit you well. ?Are closed at the toe. Do not wear sandals. ?If you use a stepladder: ?Make sure that it is fully opened. Do not climb a closed stepladder. ?Make sure that both sides of the stepladder are locked into place. ?Ask someone to hold it for you, if possible. ?Clearly mark and make sure that you can  see: ?Any grab bars or handrails. ?First and last steps. ?Where the edge of each step is. ?Use tools that help you move around (mobility aids) if they are needed. These include: ?Canes. ?Walkers. ?Scooters. ?Crutches. ?T

## 2022-05-15 ENCOUNTER — Ambulatory Visit: Payer: Medicare Other | Admitting: Internal Medicine

## 2022-05-16 DIAGNOSIS — M0609 Rheumatoid arthritis without rheumatoid factor, multiple sites: Secondary | ICD-10-CM | POA: Diagnosis not present

## 2022-05-16 DIAGNOSIS — E663 Overweight: Secondary | ICD-10-CM | POA: Diagnosis not present

## 2022-05-16 DIAGNOSIS — Z6826 Body mass index (BMI) 26.0-26.9, adult: Secondary | ICD-10-CM | POA: Diagnosis not present

## 2022-05-21 ENCOUNTER — Encounter: Payer: Self-pay | Admitting: Internal Medicine

## 2022-05-21 ENCOUNTER — Ambulatory Visit (INDEPENDENT_AMBULATORY_CARE_PROVIDER_SITE_OTHER): Payer: Medicare Other | Admitting: Internal Medicine

## 2022-05-21 VITALS — BP 134/86 | HR 55 | Temp 98.1°F | Resp 16 | Ht 67.0 in | Wt 171.4 lb

## 2022-05-21 DIAGNOSIS — I10 Essential (primary) hypertension: Secondary | ICD-10-CM

## 2022-05-21 DIAGNOSIS — R739 Hyperglycemia, unspecified: Secondary | ICD-10-CM

## 2022-05-21 DIAGNOSIS — M0609 Rheumatoid arthritis without rheumatoid factor, multiple sites: Secondary | ICD-10-CM | POA: Diagnosis not present

## 2022-05-21 DIAGNOSIS — M069 Rheumatoid arthritis, unspecified: Secondary | ICD-10-CM | POA: Insufficient documentation

## 2022-05-21 LAB — BASIC METABOLIC PANEL
BUN: 18 mg/dL (ref 6–23)
CO2: 32 mEq/L (ref 19–32)
Calcium: 9.9 mg/dL (ref 8.4–10.5)
Chloride: 102 mEq/L (ref 96–112)
Creatinine, Ser: 0.94 mg/dL (ref 0.40–1.50)
GFR: 76.3 mL/min (ref >60.00)
Glucose, Bld: 108 mg/dL — ABNORMAL HIGH (ref 70–99)
Potassium: 4 mEq/L (ref 3.5–5.1)
Sodium: 140 mEq/L (ref 135–145)

## 2022-05-21 LAB — HEMOGLOBIN A1C: Hgb A1c MFr Bld: 6.1 % (ref 4.6–6.5)

## 2022-05-21 NOTE — Assessment & Plan Note (Signed)
HTN: See last visit.  BP meds were adjusted.  Ambulatory BPs range from 106/60, to 157/75, the great majority of readings are WNL.  LE edema improved. (Chronic on the L). Plan: We agreed to continue without change-carvedilol, HCTZ, Micardis.  Check BMP. Rheumatoid arthritis: Since the last visit, saw rheumatology, he was diagnosed with rheumatoid arthritis with a negative RF. Was recommended MTX (to start soon).  In the meantime he received a round of prednisone and is feeling great. RTC 4 months
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
## 2022-05-21 NOTE — Patient Instructions (Addendum)
Recommend to proceed with covid booster (bivalent) at your pharmacy.   Continue the same medications and check your blood pressure regularly BP GOAL is between 110/65 and  135/85. If it is consistently higher or lower, let me know    GO TO THE LAB : Get the blood work     [place], [place] back for a checkup in 4 months

## 2022-05-29 ENCOUNTER — Other Ambulatory Visit: Payer: Self-pay | Admitting: Internal Medicine

## 2022-05-31 ENCOUNTER — Other Ambulatory Visit: Payer: Self-pay

## 2022-05-31 ENCOUNTER — Ambulatory Visit: Payer: TRICARE For Life (TFL) | Admitting: Internal Medicine

## 2022-05-31 MED ORDER — TELMISARTAN 40 MG PO TABS: 20.0000 mg | ORAL_TABLET | Freq: Every day | ORAL | 1 refills | Status: DC

## 2022-06-04 ENCOUNTER — Other Ambulatory Visit: Payer: Self-pay | Admitting: Internal Medicine

## 2022-06-11 ENCOUNTER — Ambulatory Visit: Payer: TRICARE For Life (TFL) | Admitting: Internal Medicine

## 2022-06-19 ENCOUNTER — Other Ambulatory Visit (HOSPITAL_BASED_OUTPATIENT_CLINIC_OR_DEPARTMENT_OTHER): Payer: Self-pay

## 2022-06-19 ENCOUNTER — Ambulatory Visit (INDEPENDENT_AMBULATORY_CARE_PROVIDER_SITE_OTHER): Payer: Medicare Other | Admitting: Internal Medicine

## 2022-06-19 ENCOUNTER — Ambulatory Visit: Payer: Medicare Other | Attending: Internal Medicine

## 2022-06-19 ENCOUNTER — Encounter: Payer: Self-pay | Admitting: Internal Medicine

## 2022-06-19 VITALS — BP 170/80 | HR 58 | Temp 98.0°F | Resp 12 | Ht 67.0 in | Wt 176.2 lb

## 2022-06-19 DIAGNOSIS — Z23 Encounter for immunization: Secondary | ICD-10-CM

## 2022-06-19 DIAGNOSIS — R600 Localized edema: Secondary | ICD-10-CM

## 2022-06-19 DIAGNOSIS — I10 Essential (primary) hypertension: Secondary | ICD-10-CM | POA: Diagnosis not present

## 2022-06-19 MED ORDER — HYDROCHLOROTHIAZIDE 25 MG PO TABS: 25.0000 mg | ORAL_TABLET | Freq: Every day | ORAL | 6 refills | Status: DC
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
## 2022-06-19 NOTE — Patient Instructions (Addendum)
Increase hydrochlorothiazide to 25 mg in the morning  Low-salt diet  Keep the legs elevated 1 hour twice daily  See your surgeon for the lip swelling ASAP  Make an appointment for blood work in 3 weeks.  Next office visit 3 months, please make an appointment as well.

## 2022-06-19 NOTE — Progress Notes (Signed)
   Covid-19 Vaccination Clinic  Name:  [name]    MRN: [id number] DOB: 08/11/1941  06/19/2022  [name] was observed post Covid-19 immunization for 15 minutes without incident. He was provided with Vaccine Information Sheet and instruction to access the V-Safe system.   [name] was instructed to call 911 with any severe reactions post vaccine: Difficulty breathing  Swelling of face and throat  A fast heartbeat  A bad rash all over body  Dizziness and weakness   Immunizations Administered     Name Date Dose VIS Date Route   Moderna Covid-19 vaccine Bivalent Booster 06/19/2022  1:34 PM 0.5 mL 08/04/2021 Intramuscular   Manufacturer: Levan Hurst   Lot: [id number]   [place]: [id number]

## 2022-06-19 NOTE — Assessment & Plan Note (Signed)
HTN: BP today is elevated, he showed me his record from home, all June ambulatory BP readings were wnl, in fact he has a couple of numbers are low (93/47, 107/55). He is concerned about increasing lower extremity edema, L>R , sxs worse late in the day.  Likes to increase diuretics. Plan: Increase HCTZ from 12.5 to 25 mg daily, Rx sent, low-salt and leg elevation recommended Continue Micardis 20 mg, carvedilol twice daily.  If BP gets in the low side, will hold carvedilol. BMP in 3 weeks L>R leg swelling: Chronic, previous ultrasound negative for DVT.  See above. Skin cancer: Had a cancer removed from the lower lip, reports in the last few days area has been swollen, on simple in inspection there is no redness,swelling, opening or discharge, plans to call his surgeon, agree, call them asap Topamax: rx elsewhere, on hold due to weight loss.   RTC labs 2 weeks RTC checkup 3 months

## 2022-06-24 ENCOUNTER — Other Ambulatory Visit (HOSPITAL_BASED_OUTPATIENT_CLINIC_OR_DEPARTMENT_OTHER): Payer: Self-pay

## 2022-06-24 DIAGNOSIS — Z23 Encounter for immunization: Secondary | ICD-10-CM | POA: Diagnosis not present

## 2022-06-24 MED ORDER — MODERNA COVID-19 BIVALENT 50 MCG/0.5ML IM SUSP
INTRAMUSCULAR | 0 refills | Status: DC
  Filled 2022-06-24: qty 0.5, 1d supply, fill #0

## 2022-07-09 ENCOUNTER — Ambulatory Visit (INDEPENDENT_AMBULATORY_CARE_PROVIDER_SITE_OTHER): Payer: Medicare Other | Admitting: Cardiovascular Disease

## 2022-07-09 ENCOUNTER — Encounter: Payer: Self-pay | Admitting: Cardiovascular Disease

## 2022-07-09 VITALS — BP 136/78 | HR 57 | Ht 67.0 in | Wt 174.4 lb

## 2022-07-09 DIAGNOSIS — I6523 Occlusion and stenosis of bilateral carotid arteries: Secondary | ICD-10-CM | POA: Diagnosis not present

## 2022-07-09 DIAGNOSIS — E785 Hyperlipidemia, unspecified: Secondary | ICD-10-CM

## 2022-07-09 DIAGNOSIS — I701 Atherosclerosis of renal artery: Secondary | ICD-10-CM | POA: Diagnosis not present

## 2022-07-09 DIAGNOSIS — I10 Essential (primary) hypertension: Secondary | ICD-10-CM | POA: Diagnosis not present

## 2022-07-09 NOTE — Patient Instructions (Signed)
Medication Instructions:  No changes *If you need a refill on your cardiac medications before your next appointment, please call your pharmacy*   Lab Work: None ordered If you have labs (blood work) drawn today and your tests are completely normal, you will receive your results only by: [place] (if you have MyChart) OR A paper copy in the mail If you have any lab test that is abnormal or we need to change your treatment, we will call you to review the results.   Testing/Procedures: Your physician has requested that you have a carotid duplex in September. This test is an ultrasound of the carotid arteries in your neck. It looks at blood flow through these arteries that supply the brain with blood. Allow one hour for this exam. There are no restrictions or special instructions. This will take place at [place], [address].  Follow-Up: At [facility], you and your health needs are our priority.  As part of our continuing mission to provide you with exceptional heart care, we have created designated Provider Care Teams.  These Care Teams include your primary Cardiologist (physician) and Advanced Practice Providers (APPs -  Physician Assistants and Nurse Practitioners) who all work together to provide you with the care you need, when you need it.  We recommend signing up for the patient portal called "MyChart".  Sign up information is provided on this After Visit Summary.  MyChart is used to connect with patients for Virtual Visits (Telemedicine).  Patients are able to view lab/test results, encounter notes, upcoming appointments, etc.  Non-urgent messages can be sent to your provider as well.   To learn more about what you can do with MyChart, go to [email or web address].    Your next appointment:   6 month(s)  The format for your next appointment:   In Person  Provider:   Kathlyn Sacramento, MD {    Important Information About Sugar
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
## 2022-07-10 ENCOUNTER — Other Ambulatory Visit (INDEPENDENT_AMBULATORY_CARE_PROVIDER_SITE_OTHER): Payer: Medicare Other

## 2022-07-10 DIAGNOSIS — I10 Essential (primary) hypertension: Secondary | ICD-10-CM

## 2022-07-10 LAB — BASIC METABOLIC PANEL
BUN: 15 mg/dL (ref 6–23)
CO2: 30 mEq/L (ref 19–32)
Calcium: 10.1 mg/dL (ref 8.4–10.5)
Chloride: 100 mEq/L (ref 96–112)
Creatinine, Ser: 0.89 mg/dL (ref 0.40–1.50)
GFR: 80.58 mL/min (ref >60.00)
Glucose, Bld: 102 mg/dL — ABNORMAL HIGH (ref 70–99)
Potassium: 4.3 mEq/L (ref 3.5–5.1)
Sodium: 137 mEq/L (ref 135–145)

## 2022-07-12 ENCOUNTER — Other Ambulatory Visit: Payer: Self-pay

## 2022-07-12 MED ORDER — HYDROCHLOROTHIAZIDE 25 MG PO TABS: 25.0000 mg | ORAL_TABLET | Freq: Every day | ORAL | 1 refills | Status: DC

## 2022-07-16 DIAGNOSIS — E663 Overweight: Secondary | ICD-10-CM | POA: Diagnosis not present

## 2022-07-16 DIAGNOSIS — M0609 Rheumatoid arthritis without rheumatoid factor, multiple sites: Secondary | ICD-10-CM | POA: Diagnosis not present

## 2022-07-16 DIAGNOSIS — Z6828 Body mass index (BMI) 28.0-28.9, adult: Secondary | ICD-10-CM | POA: Diagnosis not present

## 2022-08-09 ENCOUNTER — Other Ambulatory Visit: Payer: Self-pay | Admitting: Cardiovascular Disease

## 2022-08-09 NOTE — Telephone Encounter (Signed)
Refill request

## 2022-09-09 ENCOUNTER — Ambulatory Visit (HOSPITAL_COMMUNITY)
Admission: RE | Admit: 2022-09-09 | Discharge: 2022-09-09 | Disposition: A | Discharge status: Home / Self Care | Payer: Medicare Other | Source: Ambulatory Visit | Attending: Cardiology | Admitting: Cardiology

## 2022-09-09 DIAGNOSIS — I771 Stricture of artery: Secondary | ICD-10-CM | POA: Insufficient documentation

## 2022-09-09 DIAGNOSIS — I6521 Occlusion and stenosis of right carotid artery: Secondary | ICD-10-CM | POA: Insufficient documentation

## 2022-09-16 ENCOUNTER — Ambulatory Visit (INDEPENDENT_AMBULATORY_CARE_PROVIDER_SITE_OTHER): Payer: Medicare Other | Admitting: Internal Medicine

## 2022-09-16 ENCOUNTER — Encounter: Payer: Self-pay | Admitting: Internal Medicine

## 2022-09-16 VITALS — BP 132/82 | HR 57 | Temp 97.7°F | Resp 18 | Ht 67.0 in | Wt 185.0 lb

## 2022-09-16 DIAGNOSIS — E7800 Pure hypercholesterolemia, unspecified: Secondary | ICD-10-CM

## 2022-09-16 DIAGNOSIS — I10 Essential (primary) hypertension: Secondary | ICD-10-CM | POA: Diagnosis not present

## 2022-09-16 DIAGNOSIS — I701 Atherosclerosis of renal artery: Secondary | ICD-10-CM

## 2022-09-16 LAB — LIPID PANEL
Cholesterol: 117 mg/dL (ref 0–200)
HDL: 45.9 mg/dL (ref >39.00)
LDL Cholesterol: 49 mg/dL (ref 0–99)
NonHDL: 70.7
Total CHOL/HDL Ratio: 3
Triglycerides: 107 mg/dL (ref 0.0–149.0)
VLDL: 21.4 mg/dL (ref 0.0–40.0)

## 2022-09-16 LAB — CBC WITH DIFFERENTIAL/PLATELET
Basophils Absolute: 0.1 10*3/uL (ref 0.0–0.1)
Basophils Relative: 1.3 % (ref 0.0–3.0)
Eosinophils Absolute: 0.6 10*3/uL (ref 0.0–0.7)
Eosinophils Relative: 5.3 % — ABNORMAL HIGH (ref 0.0–5.0)
HCT: 39.2 % (ref 39.0–52.0)
Hemoglobin: 13.5 g/dL (ref 13.0–17.0)
Lymphocytes Relative: 14.1 % (ref 12.0–46.0)
Lymphs Abs: 1.5 10*3/uL (ref 0.7–4.0)
MCHC: 34.4 g/dL (ref 30.0–36.0)
MCV: 94.1 fl (ref 78.0–100.0)
Monocytes Absolute: 0.4 10*3/uL (ref 0.1–1.0)
Monocytes Relative: 4.3 % (ref 3.0–12.0)
Neutro Abs: 7.8 10*3/uL — ABNORMAL HIGH (ref 1.4–7.7)
Neutrophils Relative %: 75 % (ref 43.0–77.0)
Platelets: 252 10*3/uL (ref 150.0–400.0)
RBC: 4.17 Mil/uL — ABNORMAL LOW (ref 4.22–5.81)
RDW: 14.7 % (ref 11.5–15.5)
WBC: 10.5 10*3/uL (ref 4.0–10.5)

## 2022-09-16 LAB — AST: AST: 21 U/L (ref 0–37)

## 2022-09-16 LAB — ALT: ALT: 21 U/L (ref 0–53)

## 2022-09-16 NOTE — Patient Instructions (Addendum)
Vaccines I recommend:  Covid booster RSV vaccine Flu shot- high dose   Check the  blood pressure regularly BP GOAL is between 110/65 and  135/85. If it is consistently higher or lower, let me know     GO TO THE LAB : Get the blood work     [place], [place] back for a checkup in 6 months
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
## 2022-09-17 ENCOUNTER — Other Ambulatory Visit: Payer: Self-pay | Admitting: *Deleted

## 2022-09-17 DIAGNOSIS — I6523 Occlusion and stenosis of bilateral carotid arteries: Secondary | ICD-10-CM

## 2022-09-17 NOTE — Assessment & Plan Note (Signed)
-  Tdap 2018 - PNM 23: x 3 , last 2018;  prevnar :2015: PNM 20: declines  - s/p shingrex - last covid vax 05-2022 - RSV d/w - Flu shot likes to wait  -CCS: h/o  several Cscopes due to polyps, Cscope  12-07, TICS , no polyp; Cscope 09-2011: 3 polyps; Colonoscopy 09-2018 , no recall per GI letter   -Prostate cancer screening:  PSAs done @ the [facility] on file

## 2022-09-17 NOTE — Assessment & Plan Note (Signed)
Prediabetes: Last A1c 2 months ago was very good. HTN: Ambulatory BPs normal, has few readings under 100, usually if he is NPO, no sxs; when BPs recheck they  quickly comes back to normal.  Recent BMP okay, continue: Carvedilol, HCTZ, telmisartan. Cardiovascular--- Saw cardiology 07/09/2022  -Aortic stenosis, next echo June 2024. -Renal artery stenosis: BP okay, felt to have whitecoat syndrome -Carotid artery stenosis: Stable moderate R stenosis, next [place] 08-2023 DJD: Takes tramadol very rarely.  On ibuprofen occasionally, not daily. Headache: Felt to be cervicogenic, not taking Topamax due to weight loss.  Plans to see neurology at the [facility] soon. Preventive care: Reviewed RTC 6 months

## 2022-10-16 DIAGNOSIS — M5136 Other intervertebral disc degeneration, lumbar region: Secondary | ICD-10-CM | POA: Diagnosis not present

## 2022-10-16 DIAGNOSIS — E663 Overweight: Secondary | ICD-10-CM | POA: Diagnosis not present

## 2022-10-16 DIAGNOSIS — M5030 Other cervical disc degeneration, unspecified cervical region: Secondary | ICD-10-CM | POA: Diagnosis not present

## 2022-10-16 DIAGNOSIS — M0609 Rheumatoid arthritis without rheumatoid factor, multiple sites: Secondary | ICD-10-CM | POA: Diagnosis not present

## 2022-10-16 DIAGNOSIS — Z6828 Body mass index (BMI) 28.0-28.9, adult: Secondary | ICD-10-CM | POA: Diagnosis not present

## 2022-11-07 ENCOUNTER — Other Ambulatory Visit: Payer: Self-pay | Admitting: Cardiovascular Disease

## 2022-11-07 NOTE — Telephone Encounter (Signed)
Refill request

## 2022-11-25 ENCOUNTER — Other Ambulatory Visit: Payer: Self-pay | Admitting: Internal Medicine

## 2022-12-03 ENCOUNTER — Other Ambulatory Visit: Payer: Self-pay | Admitting: Cardiovascular Disease

## 2022-12-27 ENCOUNTER — Encounter: Payer: Self-pay | Admitting: Internal Medicine

## 2023-01-08 ENCOUNTER — Other Ambulatory Visit: Payer: Self-pay | Admitting: Internal Medicine

## 2023-01-30 DIAGNOSIS — G959 Disease of spinal cord, unspecified: Secondary | ICD-10-CM | POA: Diagnosis not present

## 2023-01-30 DIAGNOSIS — Z6827 Body mass index (BMI) 27.0-27.9, adult: Secondary | ICD-10-CM | POA: Diagnosis not present

## 2023-02-04 ENCOUNTER — Ambulatory Visit: Payer: Medicare Other | Admitting: Cardiovascular Disease

## 2023-02-04 DIAGNOSIS — E663 Overweight: Secondary | ICD-10-CM | POA: Diagnosis not present

## 2023-02-04 DIAGNOSIS — M5030 Other cervical disc degeneration, unspecified cervical region: Secondary | ICD-10-CM | POA: Diagnosis not present

## 2023-02-04 DIAGNOSIS — M5136 Other intervertebral disc degeneration, lumbar region: Secondary | ICD-10-CM | POA: Diagnosis not present

## 2023-02-04 DIAGNOSIS — M0609 Rheumatoid arthritis without rheumatoid factor, multiple sites: Secondary | ICD-10-CM | POA: Diagnosis not present

## 2023-02-04 DIAGNOSIS — Z6829 Body mass index (BMI) 29.0-29.9, adult: Secondary | ICD-10-CM | POA: Diagnosis not present

## 2023-02-05 ENCOUNTER — Other Ambulatory Visit: Payer: Self-pay | Admitting: Neurosurgery

## 2023-02-10 NOTE — Pre-Procedure Instructions (Signed)
Surgical Instructions    Your procedure is scheduled on February 17, 2023.  Report to [facility] Main Entrance "A" at 7:50 A.M., then check in with the Admitting office.  Call this number if you have problems the morning of surgery:  [phone]  If you have any questions prior to your surgery date call [phone]: Open Monday-Friday 8am-4pm If you experience any cold or flu symptoms such as cough, fever, chills, shortness of breath, etc. between now and your scheduled surgery, please notify us at the above number.     Remember:  Do not eat after midnight the night before your surgery  You may drink clear liquids until 6:50 AM the morning of your surgery.   Clear liquids allowed are: Water, Non-Citrus Juices (without pulp), Carbonated Beverages, Clear Tea, Black Coffee Only (NO MILK, CREAM OR POWDERED CREAMER of any kind), and Gatorade.      Take these medicines the morning of surgery with A SIP OF WATER:  atorvastatin (LIPITOR)   carvedilol (COREG)   ezetimibe (ZETIA)   loratadine (CLARITIN)   topiramate (TOPAMAX)   valACYclovir (VALTREX)    May take these medicines IF NEEDED:  traMADol (ULTRAM)    STOP taking your methotrexate three days prior top surgery.   As of today, STOP taking any Aspirin (unless otherwise instructed by your surgeon) Aleve, Naproxen, Ibuprofen, Motrin, Advil, Goody's, BC's, all herbal medications, fish oil, and all vitamins.                     Do NOT Smoke (Tobacco/Vaping) for 24 hours prior to your procedure.  If you use a CPAP at night, you may bring your mask/headgear for your overnight stay.   Contacts, glasses, piercing's, hearing aid's, dentures or partials may not be worn into surgery, please bring cases for these belongings.    For patients admitted to the hospital, discharge time will be determined by your treatment team.   Patients discharged the day of surgery will not be allowed to drive home, and someone needs to stay with them  for 24 hours.  SURGICAL WAITING ROOM VISITATION Patients having surgery or a procedure may have no more than 2 support people in the waiting area - these visitors may rotate.   Children under the age of 43 must have an adult with them who is not the patient. If the patient needs to stay at the hospital during part of their recovery, the visitor guidelines for inpatient rooms apply. Pre-op nurse will coordinate an appropriate time for 1 support person to accompany patient in pre-op.  This support person may not rotate.   Please refer to the [facility] website for the visitor guidelines for Inpatients (after your surgery is over and you are in a regular room).    Special instructions:   [place]- Preparing For Surgery  Before surgery, you can play an important role. Because skin is not sterile, your skin needs to be as free of germs as possible. You can reduce the number of germs on your skin by washing with CHG (chlorahexidine gluconate) Soap before surgery.  CHG is an antiseptic cleaner which kills germs and bonds with the skin to continue killing germs even after washing.    Oral Hygiene is also important to reduce your risk of infection.  Remember - BRUSH YOUR TEETH THE MORNING OF SURGERY WITH YOUR REGULAR TOOTHPASTE  Please do not use if you have an allergy to CHG or antibacterial soaps. If your skin becomes reddened/irritated  stop using the CHG.  Do not shave (including legs and underarms) for at least 48 hours prior to first CHG shower. It is OK to shave your face.  Please follow these instructions carefully.   Shower the NIGHT BEFORE SURGERY and the MORNING OF SURGERY  If you chose to wash your hair, wash your hair first as usual with your normal shampoo.  After you shampoo, rinse your hair and body thoroughly to remove the shampoo.  Use CHG Soap as you would any other liquid soap. You can apply CHG directly to the skin and wash gently with a scrungie or a clean washcloth.    Apply the CHG Soap to your body ONLY FROM THE NECK DOWN.  Do not use on open wounds or open sores. Avoid contact with your eyes, ears, mouth and genitals (private parts). Wash Face and genitals (private parts)  with your normal soap.   Wash thoroughly, paying special attention to the area where your surgery will be performed.  Thoroughly rinse your body with warm water from the neck down.  DO NOT shower/wash with your normal soap after using and rinsing off the CHG Soap.  Pat yourself dry with a CLEAN TOWEL.  Wear CLEAN PAJAMAS to bed the night before surgery  Place CLEAN SHEETS on your bed the night before your surgery  DO NOT SLEEP WITH PETS.   Day of Surgery: Take a shower with CHG soap. Do not wear jewelry or makeup Do not wear lotions, powders, perfumes/colognes, or deodorant. Do not shave 48 hours prior to surgery.  Men may shave face and neck. Do not bring valuables to the hospital.  [facility] is not responsible for any belongings or valuables. Do not wear nail polish, gel polish, artificial nails, or any other type of covering on natural nails (fingers and toes) If you have artificial nails or gel coating that need to be removed by a nail salon, please have this removed prior to surgery. Artificial nails or gel coating may interfere with anesthesia's ability to adequately monitor your vital signs.  Wear Clean/Comfortable clothing the morning of surgery Remember to brush your teeth WITH YOUR REGULAR TOOTHPASTE.   Please read over the following fact sheets that you were given.    If you received a COVID test during your pre-op visit  it is requested that you wear a mask when out in public, stay away from anyone that may not be feeling well and notify your surgeon if you develop symptoms. If you have been in contact with anyone that has tested positive in the last 10 days please notify you surgeon.

## 2023-02-11 ENCOUNTER — Encounter
Admission: RE | Admit: 2023-02-11 | Discharge: 2023-02-11 | Disposition: A | Discharge status: Home / Self Care | Payer: Medicare Other | Source: Ambulatory Visit | Attending: Cardiovascular Disease | Admitting: Cardiovascular Disease

## 2023-02-11 ENCOUNTER — Other Ambulatory Visit: Payer: Self-pay

## 2023-02-11 ENCOUNTER — Ambulatory Visit (INDEPENDENT_AMBULATORY_CARE_PROVIDER_SITE_OTHER): Payer: Medicare Other | Admitting: Cardiovascular Disease

## 2023-02-11 ENCOUNTER — Encounter (HOSPITAL_COMMUNITY): Payer: Self-pay

## 2023-02-11 VITALS — BP 176/74 | HR 65 | Temp 97.5°F | Resp 18 | Ht 67.0 in | Wt 182.0 lb

## 2023-02-11 VITALS — BP 170/70 | HR 63 | Ht 67.0 in | Wt 184.2 lb

## 2023-02-11 DIAGNOSIS — I6521 Occlusion and stenosis of right carotid artery: Secondary | ICD-10-CM

## 2023-02-11 DIAGNOSIS — I872 Venous insufficiency (chronic) (peripheral): Secondary | ICD-10-CM

## 2023-02-11 DIAGNOSIS — I701 Atherosclerosis of renal artery: Secondary | ICD-10-CM

## 2023-02-11 DIAGNOSIS — M353 Polymyalgia rheumatica: Secondary | ICD-10-CM | POA: Insufficient documentation

## 2023-02-11 DIAGNOSIS — M5125 Other intervertebral disc displacement, thoracolumbar region: Secondary | ICD-10-CM | POA: Insufficient documentation

## 2023-02-11 DIAGNOSIS — I10 Essential (primary) hypertension: Secondary | ICD-10-CM

## 2023-02-11 DIAGNOSIS — I350 Nonrheumatic aortic (valve) stenosis: Secondary | ICD-10-CM

## 2023-02-11 DIAGNOSIS — Z79624 Long term (current) use of inhibitors of nucleotide synthesis: Secondary | ICD-10-CM | POA: Diagnosis not present

## 2023-02-11 DIAGNOSIS — M419 Scoliosis, unspecified: Secondary | ICD-10-CM | POA: Diagnosis not present

## 2023-02-11 DIAGNOSIS — Z01818 Encounter for other preprocedural examination: Secondary | ICD-10-CM | POA: Diagnosis not present

## 2023-02-11 DIAGNOSIS — G8929 Other chronic pain: Secondary | ICD-10-CM | POA: Diagnosis not present

## 2023-02-11 DIAGNOSIS — Z79899 Other long term (current) drug therapy: Secondary | ICD-10-CM | POA: Insufficient documentation

## 2023-02-11 DIAGNOSIS — Z87891 Personal history of nicotine dependence: Secondary | ICD-10-CM | POA: Insufficient documentation

## 2023-02-11 DIAGNOSIS — I2510 Atherosclerotic heart disease of native coronary artery without angina pectoris: Secondary | ICD-10-CM | POA: Insufficient documentation

## 2023-02-11 DIAGNOSIS — E785 Hyperlipidemia, unspecified: Secondary | ICD-10-CM | POA: Insufficient documentation

## 2023-02-11 HISTORY — DX: Chronic kidney disease, unspecified: N18.9

## 2023-02-11 LAB — CBC
HCT: 42 % (ref 39.0–52.0)
Hemoglobin: 14.7 g/dL (ref 13.0–17.0)
MCH: 32.7 pg (ref 26.0–34.0)
MCHC: 35 g/dL (ref 30.0–36.0)
MCV: 93.3 fL (ref 80.0–100.0)
Platelets: 281 10*3/uL (ref 150–400)
RBC: 4.5 MIL/uL (ref 4.22–5.81)
RDW: 12.7 % (ref 11.5–15.5)
WBC: 12.5 10*3/uL — ABNORMAL HIGH (ref 4.0–10.5)
nRBC: 0 % (ref 0.0–0.2)

## 2023-02-11 LAB — BASIC METABOLIC PANEL
Anion gap: 10 (ref 5–15)
BUN: 16 mg/dL (ref 8–23)
CO2: 27 mmol/L (ref 22–32)
Calcium: 10 mg/dL (ref 8.9–10.3)
Chloride: 102 mmol/L (ref 98–111)
Creatinine, Ser: 0.92 mg/dL (ref 0.61–1.24)
GFR, Estimated: >60 mL/min (ref >60)
Glucose, Bld: 113 mg/dL — ABNORMAL HIGH (ref 70–99)
Potassium: 3.3 mmol/L — ABNORMAL LOW (ref 3.5–5.1)
Sodium: 139 mmol/L (ref 135–145)

## 2023-02-11 LAB — SURGICAL PCR SCREEN

## 2023-02-11 NOTE — Patient Instructions (Signed)
Medication Instructions:  No changes *If you need a refill on your cardiac medications before your next appointment, please call your pharmacy*   Lab Work: None ordered If you have labs (blood work) drawn today and your tests are completely normal, you will receive your results only by: [place] (if you have MyChart) OR A paper copy in the mail If you have any lab test that is abnormal or we need to change your treatment, we will call you to review the results.   Testing/Procedures: Your physician has requested that you have an echocardiogram in June. Echocardiography is a painless test that uses sound waves to create images of your heart. It provides your doctor with information about the size and shape of your heart and how well your heart's chambers and valves are working. You may receive an ultrasound enhancing agent through an IV if needed to better visualize your heart during the echo.This procedure takes approximately one hour. There are no restrictions for this procedure. This will take place at the [address], [address].     Follow-Up: At [facility], you and your health needs are our priority.  As part of our continuing mission to provide you with exceptional heart care, we have created designated Provider Care Teams.  These Care Teams include your primary Cardiologist (physician) and Advanced Practice Providers (APPs -  Physician Assistants and Nurse Practitioners) who all work together to provide you with the care you need, when you need it.  We recommend signing up for the patient portal called "MyChart".  Sign up information is provided on this After Visit Summary.  MyChart is used to connect with patients for Virtual Visits (Telemedicine).  Patients are able to view lab/test results, encounter notes, upcoming appointments, etc.  Non-urgent messages can be sent to your provider as well.   To learn more about what you can do with MyChart, go to  [email or web address].    Your next appointment:   6 month(s)  Provider:   Kathlyn Sacramento, MD

## 2023-02-11 NOTE — Progress Notes (Signed)
PCP - Dr. Jacqulyn Bath Paz/Dr. Maisie Fus at the [facility] Cardiologist - Dr. Kathlyn Sacramento  PPM/ICD - Denies Device Orders - n/a Rep Notified - n/a  Chest x-ray - n/a EKG - 07/09/2022 Stress Test - 05/03/2011 ECHO - 06/12/2021 Cardiac Cath - Denies  Sleep Study - OSA+ in 2008, but pt unable to tolerate CPAP due to night terrors/PTSD.  No DM  Last dose of GLP1 agonist- n/a GLP1 instructions: n/a  Blood Thinner Instructions: n/a Aspirin Instructions: n/a  ERAS Protcol - Clear liquids until 0650 morning of surgery PRE-SURGERY Ensure or G2- n/a  COVID TEST- n/a   Anesthesia review: Yes. Cardiac Clearance visit 02/11/2023. Pt flagged for difficult intubation r/t titanium plate in neck. Discussed with [name], PA-C. Pt has had no changes in neck mobility/opening mouth. He had an endoscopy in 2023 with no intubation complications/issues. Pt does endorse dysphagia and says that he has to take him medications with applesauce and/or large amounts of water. Pt instructed to only take Coreg morning of surgery with a small sip of water and pt states he will be able to tolerate.    Patient denies shortness of breath, fever, cough and chest pain at PAT appointment   All instructions explained to the patient, with a verbal understanding of the material. Patient agrees to go over the instructions while at home for a better understanding. Patient also instructed to self quarantine after being tested for COVID-19. The opportunity to ask questions was provided.
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
## 2023-02-12 NOTE — Progress Notes (Signed)
Surgical PCR invalid at PAT appointment.  Will repeat DOS.

## 2023-02-12 NOTE — Anesthesia Preprocedure Evaluation (Addendum)
Anesthesia Evaluation  Patient identified by MRN, date of birth, ID band Patient awake    Reviewed: Allergy & Precautions, NPO status , Patient's Chart, lab work & pertinent test results, reviewed documented beta blocker date and time   Airway Mallampati: III  TM Distance: >3 FB Neck ROM: Full    Dental  (+) Dental Advisory Given, Teeth Intact   Pulmonary sleep apnea , former smoker   Pulmonary exam normal        Cardiovascular hypertension, Pt. on medications and Pt. on home beta blockers + Past MI and + Peripheral Vascular Disease  Normal cardiovascular exam   '23 Carotid US - 40-59% right ICAS, 1-39% left ICAS  '22 TTE - Normal EF. Mild AS    Neuro/Psych  Headaches PSYCHIATRIC DISORDERS Anxiety Depression     PTSD  Hard of hearing Hx TBI Hx sleep paralysis RLS  TIA Neuromuscular disease    GI/Hepatic Neg liver ROS, hiatal hernia,GERD  Controlled and Medicated,,  Endo/Other  negative endocrine ROS    Renal/GU negative Renal ROS     Musculoskeletal  (+) Arthritis ,   PMR Scoliosis    Abdominal   Peds  Hematology negative hematology ROS (+)   Anesthesia Other Findings   Reproductive/Obstetrics                             Anesthesia Physical Anesthesia Plan  ASA: 3  Anesthesia Plan: General   Post-op Pain Management:    Induction: Intravenous  PONV Risk Score and Plan: 2 and Treatment may vary due to age or medical condition, Ondansetron and Propofol infusion  Airway Management Planned: Oral ETT  Additional Equipment: None  Intra-op Plan:   Post-operative Plan: Extubation in OR  Informed Consent: I have reviewed the patients History and Physical, chart, labs and discussed the procedure including the risks, benefits and alternatives for the proposed anesthesia with the patient or authorized representative who has indicated his/her understanding and acceptance.      Dental advisory given  Plan Discussed with: CRNA, Anesthesiologist and Surgeon  Anesthesia Plan Comments: (See PAT note. )       Anesthesia Quick Evaluation

## 2023-02-13 ENCOUNTER — Encounter: Payer: Self-pay | Admitting: Internal Medicine

## 2023-02-17 ENCOUNTER — Encounter (HOSPITAL_COMMUNITY): Payer: Self-pay | Admitting: Neurosurgery

## 2023-02-17 ENCOUNTER — Ambulatory Visit (HOSPITAL_BASED_OUTPATIENT_CLINIC_OR_DEPARTMENT_OTHER): Payer: Medicare Other | Admitting: Anesthesiology

## 2023-02-17 ENCOUNTER — Other Ambulatory Visit: Payer: Self-pay

## 2023-02-17 ENCOUNTER — Ambulatory Visit (HOSPITAL_COMMUNITY): Payer: Medicare Other

## 2023-02-17 ENCOUNTER — Ambulatory Visit (HOSPITAL_COMMUNITY): Payer: Medicare Other | Admitting: Vascular Surgery

## 2023-02-17 ENCOUNTER — Ambulatory Visit (HOSPITAL_COMMUNITY)
Admission: RE | Admit: 2023-02-17 | Discharge: 2023-02-18 | Disposition: A | Discharge status: Home / Self Care | Payer: Medicare Other | Attending: Neurosurgery | Admitting: Neurosurgery

## 2023-02-17 ENCOUNTER — Ambulatory Visit (HOSPITAL_COMMUNITY)
Admission: RE | Disposition: A | Discharge status: Home / Self Care | Payer: Self-pay | Source: Home / Self Care | Attending: Neurosurgery

## 2023-02-17 DIAGNOSIS — Z87891 Personal history of nicotine dependence: Secondary | ICD-10-CM | POA: Insufficient documentation

## 2023-02-17 DIAGNOSIS — K219 Gastro-esophageal reflux disease without esophagitis: Secondary | ICD-10-CM | POA: Diagnosis not present

## 2023-02-17 DIAGNOSIS — R2681 Unsteadiness on feet: Secondary | ICD-10-CM | POA: Diagnosis not present

## 2023-02-17 DIAGNOSIS — M48061 Spinal stenosis, lumbar region without neurogenic claudication: Secondary | ICD-10-CM | POA: Insufficient documentation

## 2023-02-17 DIAGNOSIS — I252 Old myocardial infarction: Secondary | ICD-10-CM | POA: Diagnosis not present

## 2023-02-17 DIAGNOSIS — M5125 Other intervertebral disc displacement, thoracolumbar region: Secondary | ICD-10-CM | POA: Diagnosis not present

## 2023-02-17 DIAGNOSIS — Z01818 Encounter for other preprocedural examination: Secondary | ICD-10-CM

## 2023-02-17 DIAGNOSIS — I739 Peripheral vascular disease, unspecified: Secondary | ICD-10-CM | POA: Insufficient documentation

## 2023-02-17 DIAGNOSIS — M4804 Spinal stenosis, thoracic region: Secondary | ICD-10-CM | POA: Diagnosis present

## 2023-02-17 DIAGNOSIS — M5135 Other intervertebral disc degeneration, thoracolumbar region: Secondary | ICD-10-CM | POA: Diagnosis not present

## 2023-02-17 DIAGNOSIS — I10 Essential (primary) hypertension: Secondary | ICD-10-CM | POA: Insufficient documentation

## 2023-02-17 DIAGNOSIS — G4730 Sleep apnea, unspecified: Secondary | ICD-10-CM | POA: Insufficient documentation

## 2023-02-17 DIAGNOSIS — Z981 Arthrodesis status: Secondary | ICD-10-CM | POA: Diagnosis not present

## 2023-02-17 DIAGNOSIS — M4805 Spinal stenosis, thoracolumbar region: Secondary | ICD-10-CM | POA: Diagnosis not present

## 2023-02-17 HISTORY — PX: LUMBAR LAMINECTOMY/DECOMPRESSION MICRODISCECTOMY: SHX5026

## 2023-02-17 LAB — SURGICAL PCR SCREEN
MRSA, PCR: NEGATIVE
Staphylococcus aureus: NEGATIVE

## 2023-02-17 SURGERY — LUMBAR LAMINECTOMY/DECOMPRESSION MICRODISCECTOMY 1 LEVEL
Anesthesia: General | Site: Back | Laterality: Bilateral

## 2023-02-17 MED ORDER — THROMBIN 5000 UNITS EX SOLR
OROMUCOSAL | Status: DC | PRN
  Administered 2023-02-17: 5 mL via TOPICAL

## 2023-02-17 MED ORDER — CALCIUM 1200 1200-1000 MG-UNIT PO CHEW: CHEWABLE_TABLET | Freq: Every morning | ORAL | Status: DC

## 2023-02-17 MED ORDER — CHLORHEXIDINE GLUCONATE 0.12 % MT SOLN: 15.0000 mL | Freq: Once | OROMUCOSAL | Status: AC

## 2023-02-17 MED ORDER — PANTOPRAZOLE SODIUM 40 MG IV SOLR: 40.0000 mg | Freq: Every day | INTRAVENOUS | Status: DC

## 2023-02-17 MED ORDER — ACETAMINOPHEN 650 MG RE SUPP: 650.0000 mg | RECTAL | Status: DC | PRN

## 2023-02-17 MED ORDER — 0.9 % SODIUM CHLORIDE (POUR BTL) OPTIME
TOPICAL | Status: DC | PRN
  Administered 2023-02-17: 1000 mL

## 2023-02-17 MED ORDER — ORAL CARE MOUTH RINSE: 15.0000 mL | Freq: Once | OROMUCOSAL | Status: DC

## 2023-02-17 MED ORDER — FENTANYL CITRATE (PF) 250 MCG/5ML IJ SOLN
INTRAMUSCULAR | Status: DC | PRN
  Administered 2023-02-17: 50 ug via INTRAVENOUS

## 2023-02-17 MED ORDER — GLUCOSAMINE 1500 COMPLEX PO CAPS: ORAL_CAPSULE | Freq: Every morning | ORAL | Status: DC

## 2023-02-17 MED ORDER — VALACYCLOVIR HCL 500 MG PO TABS
1000.0000 mg | ORAL_TABLET | Freq: Two times a day (BID) | ORAL | Status: DC
  Administered 2023-02-17: 1000 mg via ORAL
  Filled 2023-02-17 (×2): qty 2

## 2023-02-17 MED ORDER — ROCURONIUM BROMIDE 10 MG/ML (PF) SYRINGE
PREFILLED_SYRINGE | INTRAVENOUS | Status: DC | PRN
  Administered 2023-02-17: 50 mg via INTRAVENOUS

## 2023-02-17 MED ORDER — SODIUM CHLORIDE 0.9% FLUSH
3.0000 mL | Freq: Two times a day (BID) | INTRAVENOUS | Status: DC
  Administered 2023-02-17: 3 mL via INTRAVENOUS

## 2023-02-17 MED ORDER — CHELATED MAGNESIUM 100 MG PO TABS: 200.0000 mg | ORAL_TABLET | Freq: Every day | ORAL | Status: DC

## 2023-02-17 MED ORDER — ORAL CARE MOUTH RINSE: 15.0000 mL | Freq: Once | OROMUCOSAL | Status: AC

## 2023-02-17 MED ORDER — LIDOCAINE-EPINEPHRINE 1 %-1:100000 IJ SOLN
INTRAMUSCULAR | Status: DC | PRN
  Administered 2023-02-17: 10 mL

## 2023-02-17 MED ORDER — CINNAMON 500 MG PO CAPS: 4000.0000 mg | ORAL_CAPSULE | Freq: Every day | ORAL | Status: DC

## 2023-02-17 MED ORDER — EZETIMIBE 10 MG PO TABS
10.0000 mg | ORAL_TABLET | Freq: Every day | ORAL | Status: DC
  Administered 2023-02-17: 10 mg via ORAL
  Filled 2023-02-17: qty 1

## 2023-02-17 MED ORDER — THROMBIN 5000 UNITS EX SOLR
CUTANEOUS | Status: AC
  Filled 2023-02-17: qty 5000

## 2023-02-17 MED ORDER — ONDANSETRON HCL 4 MG/2ML IJ SOLN: 4.0000 mg | Freq: Once | INTRAMUSCULAR | Status: DC | PRN

## 2023-02-17 MED ORDER — PANTOPRAZOLE SODIUM 40 MG PO TBEC
40.0000 mg | DELAYED_RELEASE_TABLET | Freq: Every day | ORAL | Status: DC
  Administered 2023-02-17: 40 mg via ORAL
  Filled 2023-02-17: qty 1

## 2023-02-17 MED ORDER — CHLORHEXIDINE GLUCONATE 0.12 % MT SOLN: 15.0000 mL | Freq: Once | OROMUCOSAL | Status: DC

## 2023-02-17 MED ORDER — DEXAMETHASONE SODIUM PHOSPHATE 10 MG/ML IJ SOLN
INTRAMUSCULAR | Status: DC | PRN
  Administered 2023-02-17: 10 mg via INTRAVENOUS

## 2023-02-17 MED ORDER — SODIUM CHLORIDE 0.9% FLUSH: 3.0000 mL | INTRAVENOUS | Status: DC | PRN

## 2023-02-17 MED ORDER — OYSTER SHELL CALCIUM/D3 500-5 MG-MCG PO TABS
1.0000 | ORAL_TABLET | Freq: Every day | ORAL | Status: DC
  Administered 2023-02-18: 1 via ORAL
  Filled 2023-02-17: qty 1

## 2023-02-17 MED ORDER — LIDOCAINE 2% (20 MG/ML) 5 ML SYRINGE
INTRAMUSCULAR | Status: DC | PRN
  Administered 2023-02-17: 60 mg via INTRAVENOUS

## 2023-02-17 MED ORDER — LIDOCAINE-EPINEPHRINE 1 %-1:100000 IJ SOLN
INTRAMUSCULAR | Status: AC
  Filled 2023-02-17: qty 1

## 2023-02-17 MED ORDER — ONDANSETRON HCL 4 MG/2ML IJ SOLN
INTRAMUSCULAR | Status: AC
  Filled 2023-02-17: qty 2

## 2023-02-17 MED ORDER — METHOTREXATE SODIUM 2.5 MG PO TABS: 20.0000 mg | ORAL_TABLET | ORAL | Status: DC

## 2023-02-17 MED ORDER — EPHEDRINE SULFATE-NACL 50-0.9 MG/10ML-% IV SOSY
PREFILLED_SYRINGE | INTRAVENOUS | Status: DC | PRN
  Administered 2023-02-17: 5 mg via INTRAVENOUS
  Administered 2023-02-17: 10 mg via INTRAVENOUS

## 2023-02-17 MED ORDER — HYDROCHLOROTHIAZIDE 25 MG PO TABS
25.0000 mg | ORAL_TABLET | Freq: Every day | ORAL | Status: DC
  Administered 2023-02-17: 25 mg via ORAL
  Filled 2023-02-17: qty 1

## 2023-02-17 MED ORDER — OXYCODONE HCL 5 MG/5ML PO SOLN: 5.0000 mg | Freq: Once | ORAL | Status: DC | PRN

## 2023-02-17 MED ORDER — PROPOFOL 10 MG/ML IV BOLUS
INTRAVENOUS | Status: AC
  Filled 2023-02-17: qty 20

## 2023-02-17 MED ORDER — SUGAMMADEX SODIUM 200 MG/2ML IV SOLN
INTRAVENOUS | Status: DC | PRN
  Administered 2023-02-17: 200 mg via INTRAVENOUS

## 2023-02-17 MED ORDER — CHLORHEXIDINE GLUCONATE 0.12 % MT SOLN
OROMUCOSAL | Status: AC
  Administered 2023-02-17: 15 mL via OROMUCOSAL
  Filled 2023-02-17: qty 15

## 2023-02-17 MED ORDER — MULTIVITAMINS PO TABS: 1.0000 | ORAL_TABLET | Freq: Every morning | ORAL | Status: DC

## 2023-02-17 MED ORDER — FOLIC ACID 1 MG PO TABS
1.0000 mg | ORAL_TABLET | Freq: Every day | ORAL | Status: DC
  Administered 2023-02-17: 1 mg via ORAL
  Filled 2023-02-17: qty 1

## 2023-02-17 MED ORDER — DEXAMETHASONE SODIUM PHOSPHATE 10 MG/ML IJ SOLN
INTRAMUSCULAR | Status: AC
  Filled 2023-02-17: qty 1

## 2023-02-17 MED ORDER — BUPIVACAINE HCL (PF) 0.25 % IJ SOLN
INTRAMUSCULAR | Status: AC
  Filled 2023-02-17: qty 30

## 2023-02-17 MED ORDER — IRBESARTAN 150 MG PO TABS
75.0000 mg | ORAL_TABLET | Freq: Every day | ORAL | Status: DC
  Administered 2023-02-17: 75 mg via ORAL
  Filled 2023-02-17: qty 1

## 2023-02-17 MED ORDER — FENTANYL CITRATE (PF) 250 MCG/5ML IJ SOLN
INTRAMUSCULAR | Status: AC
  Filled 2023-02-17: qty 5

## 2023-02-17 MED ORDER — OXYCODONE HCL 5 MG PO TABS: 5.0000 mg | ORAL_TABLET | Freq: Once | ORAL | Status: DC | PRN

## 2023-02-17 MED ORDER — LIDOCAINE 2% (20 MG/ML) 5 ML SYRINGE
INTRAMUSCULAR | Status: AC
  Filled 2023-02-17: qty 5

## 2023-02-17 MED ORDER — LACTATED RINGERS IV SOLN: INTRAVENOUS | Status: DC

## 2023-02-17 MED ORDER — CARVEDILOL 3.125 MG PO TABS
3.1250 mg | ORAL_TABLET | Freq: Two times a day (BID) | ORAL | Status: DC
  Administered 2023-02-17 – 2023-02-18 (×2): 3.125 mg via ORAL
  Filled 2023-02-17 (×2): qty 1

## 2023-02-17 MED ORDER — THROMBIN 5000 UNITS EX SOLR
CUTANEOUS | Status: AC
  Filled 2023-02-17: qty 10000

## 2023-02-17 MED ORDER — FENTANYL CITRATE (PF) 100 MCG/2ML IJ SOLN
25.0000 ug | INTRAMUSCULAR | Status: DC | PRN
  Administered 2023-02-17: 25 ug via INTRAVENOUS
  Administered 2023-02-17: 50 ug via INTRAVENOUS
  Administered 2023-02-17: 25 ug via INTRAVENOUS

## 2023-02-17 MED ORDER — SODIUM CHLORIDE 0.9 % IV SOLN
250.0000 mL | INTRAVENOUS | Status: DC
  Administered 2023-02-17: 250 mL via INTRAVENOUS

## 2023-02-17 MED ORDER — LORATADINE 10 MG PO TABS
10.0000 mg | ORAL_TABLET | Freq: Every day | ORAL | Status: DC
  Administered 2023-02-17: 10 mg via ORAL
  Filled 2023-02-17: qty 1

## 2023-02-17 MED ORDER — ALUM & MAG HYDROXIDE-SIMETH 200-200-20 MG/5ML PO SUSP: 30.0000 mL | Freq: Four times a day (QID) | ORAL | Status: DC | PRN

## 2023-02-17 MED ORDER — CEFAZOLIN SODIUM-DEXTROSE 2-4 GM/100ML-% IV SOLN
INTRAVENOUS | Status: AC
  Filled 2023-02-17: qty 100

## 2023-02-17 MED ORDER — MAGNESIUM OXIDE -MG SUPPLEMENT 400 (240 MG) MG PO TABS
200.0000 mg | ORAL_TABLET | Freq: Every day | ORAL | Status: DC
  Administered 2023-02-17: 200 mg via ORAL
  Filled 2023-02-17: qty 1

## 2023-02-17 MED ORDER — ROCURONIUM BROMIDE 10 MG/ML (PF) SYRINGE
PREFILLED_SYRINGE | INTRAVENOUS | Status: AC
  Filled 2023-02-17: qty 10

## 2023-02-17 MED ORDER — MENTHOL 3 MG MT LOZG: 1.0000 | LOZENGE | OROMUCOSAL | Status: DC | PRN

## 2023-02-17 MED ORDER — PHENOL 1.4 % MT LIQD: 1.0000 | OROMUCOSAL | Status: DC | PRN

## 2023-02-17 MED ORDER — ALBUMIN HUMAN 5 % IV SOLN: INTRAVENOUS | Status: DC | PRN

## 2023-02-17 MED ORDER — CEFAZOLIN SODIUM-DEXTROSE 2-4 GM/100ML-% IV SOLN: 2.0000 g | Freq: Three times a day (TID) | INTRAVENOUS | Status: DC

## 2023-02-17 MED ORDER — TOPIRAMATE 25 MG PO TABS
25.0000 mg | ORAL_TABLET | Freq: Two times a day (BID) | ORAL | Status: DC
  Administered 2023-02-17: 25 mg via ORAL
  Filled 2023-02-17: qty 1

## 2023-02-17 MED ORDER — CYCLOBENZAPRINE HCL 10 MG PO TABS
10.0000 mg | ORAL_TABLET | Freq: Three times a day (TID) | ORAL | Status: DC | PRN
  Administered 2023-02-17: 10 mg via ORAL
  Filled 2023-02-17: qty 1

## 2023-02-17 MED ORDER — ADULT MULTIVITAMIN W/MINERALS CH
1.0000 | ORAL_TABLET | Freq: Every day | ORAL | Status: DC
  Administered 2023-02-17: 1 via ORAL
  Filled 2023-02-17: qty 1

## 2023-02-17 MED ORDER — HYDRALAZINE HCL 20 MG/ML IJ SOLN
INTRAMUSCULAR | Status: AC
  Filled 2023-02-17: qty 1

## 2023-02-17 MED ORDER — THROMBIN (RECOMBINANT) 5000 UNITS EX SOLR
CUTANEOUS | Status: DC | PRN
  Administered 2023-02-17: 1 via TOPICAL

## 2023-02-17 MED ORDER — FENTANYL CITRATE (PF) 100 MCG/2ML IJ SOLN
INTRAMUSCULAR | Status: AC
  Filled 2023-02-17: qty 2

## 2023-02-17 MED ORDER — ONDANSETRON HCL 4 MG/2ML IJ SOLN: 4.0000 mg | Freq: Four times a day (QID) | INTRAMUSCULAR | Status: DC | PRN

## 2023-02-17 MED ORDER — PRESERVISION AREDS 2 PO CAPS: 1.0000 | ORAL_CAPSULE | Freq: Two times a day (BID) | ORAL | Status: DC

## 2023-02-17 MED ORDER — HYDROMORPHONE HCL 1 MG/ML IJ SOLN: 0.5000 mg | INTRAMUSCULAR | Status: DC | PRN

## 2023-02-17 MED ORDER — ATORVASTATIN CALCIUM 10 MG PO TABS
10.0000 mg | ORAL_TABLET | Freq: Every day | ORAL | Status: DC
  Administered 2023-02-17: 10 mg via ORAL
  Filled 2023-02-17: qty 1

## 2023-02-17 MED ORDER — ONDANSETRON HCL 4 MG PO TABS: 4.0000 mg | ORAL_TABLET | Freq: Four times a day (QID) | ORAL | Status: DC | PRN

## 2023-02-17 MED ORDER — PROPOFOL 10 MG/ML IV BOLUS
INTRAVENOUS | Status: DC | PRN
  Administered 2023-02-17: 140 mg via INTRAVENOUS

## 2023-02-17 MED ORDER — ONDANSETRON HCL 4 MG/2ML IJ SOLN
INTRAMUSCULAR | Status: DC | PRN
  Administered 2023-02-17: 4 mg via INTRAVENOUS

## 2023-02-17 MED ORDER — TRAMADOL HCL 50 MG PO TABS: 50.0000 mg | ORAL_TABLET | Freq: Two times a day (BID) | ORAL | Status: DC | PRN

## 2023-02-17 MED ORDER — CHOLECALCIFEROL 100 MCG (4000 UT) PO CAPS: 20000.0000 [IU] | ORAL_CAPSULE | Freq: Every morning | ORAL | Status: DC

## 2023-02-17 MED ORDER — HYDRALAZINE HCL 20 MG/ML IJ SOLN
10.0000 mg | Freq: Once | INTRAMUSCULAR | Status: AC
  Administered 2023-02-17: 10 mg via INTRAVENOUS

## 2023-02-17 MED ORDER — CEFAZOLIN SODIUM-DEXTROSE 2-4 GM/100ML-% IV SOLN
2.0000 g | Freq: Three times a day (TID) | INTRAVENOUS | Status: AC
  Administered 2023-02-17 – 2023-02-18 (×2): 2 g via INTRAVENOUS
  Filled 2023-02-17 (×2): qty 100

## 2023-02-17 MED ORDER — ACETAMINOPHEN 325 MG PO TABS: 650.0000 mg | ORAL_TABLET | ORAL | Status: DC | PRN

## 2023-02-17 MED ORDER — TRAMADOL HCL 50 MG PO TABS
50.0000 mg | ORAL_TABLET | Freq: Four times a day (QID) | ORAL | Status: DC | PRN
  Administered 2023-02-17 – 2023-02-18 (×3): 50 mg via ORAL
  Filled 2023-02-17 (×3): qty 1

## 2023-02-17 SURGICAL SUPPLY — 51 items
ADH SKN CLS APL DERMABOND .7 (GAUZE/BANDAGES/DRESSINGS) ×1
APL SKNCLS STERI-STRIP NONHPOA (GAUZE/BANDAGES/DRESSINGS) ×1
BAG COUNTER SPONGE SURGICOUNT (BAG) ×2 IMPLANT
BAG SPNG CNTER NS LX DISP (BAG) ×1
BAND INSRT 18 STRL LF DISP RB (MISCELLANEOUS) ×2
BAND RUBBER #18 3X1/16 STRL (MISCELLANEOUS) ×4 IMPLANT
BENZOIN TINCTURE PRP APPL 2/3 (GAUZE/BANDAGES/DRESSINGS) ×2 IMPLANT
BLADE CLIPPER SURG (BLADE) IMPLANT
BLADE SURG 11 STRL SS (BLADE) ×2 IMPLANT
BUR CUTTER 7.0 ROUND (BURR) ×2 IMPLANT
BUR MATCHSTICK NEURO 3.0 LAGG (BURR) ×2 IMPLANT
CANISTER SUCT 3000ML PPV (MISCELLANEOUS) ×2 IMPLANT
DERMABOND ADVANCED .7 DNX12 (GAUZE/BANDAGES/DRESSINGS) ×2 IMPLANT
DRAPE HALF SHEET 40X57 (DRAPES) IMPLANT
DRAPE LAPAROTOMY 100X72X124 (DRAPES) ×2 IMPLANT
DRAPE MICROSCOPE SLANT 54X150 (MISCELLANEOUS) ×2 IMPLANT
DRAPE SURG 17X23 STRL (DRAPES) ×2 IMPLANT
DRSG OPSITE POSTOP 4X6 (GAUZE/BANDAGES/DRESSINGS) IMPLANT
DURAPREP 26ML APPLICATOR (WOUND CARE) ×2 IMPLANT
ELECT REM PT RETURN 9FT ADLT (ELECTROSURGICAL) ×1
ELECTRODE REM PT RTRN 9FT ADLT (ELECTROSURGICAL) ×2 IMPLANT
GAUZE 4X4 16PLY ~~LOC~~+RFID DBL (SPONGE) IMPLANT
GAUZE SPONGE 4X4 12PLY STRL (GAUZE/BANDAGES/DRESSINGS) ×2 IMPLANT
GLOVE BIO SURGEON STRL SZ7 (GLOVE) IMPLANT
GLOVE BIO SURGEON STRL SZ8 (GLOVE) ×2 IMPLANT
GLOVE BIOGEL PI IND STRL 7.0 (GLOVE) IMPLANT
GLOVE EXAM NITRILE XL STR (GLOVE) IMPLANT
GLOVE INDICATOR 8.5 STRL (GLOVE) ×4 IMPLANT
GOWN STRL REUS W/ TWL LRG LVL3 (GOWN DISPOSABLE) ×2 IMPLANT
GOWN STRL REUS W/ TWL XL LVL3 (GOWN DISPOSABLE) ×4 IMPLANT
GOWN STRL REUS W/TWL 2XL LVL3 (GOWN DISPOSABLE) IMPLANT
GOWN STRL REUS W/TWL LRG LVL3 (GOWN DISPOSABLE) ×2
GOWN STRL REUS W/TWL XL LVL3 (GOWN DISPOSABLE) ×2
HEMOSTAT POWDER KIT SURGIFOAM (HEMOSTASIS) IMPLANT
KIT BASIN OR (CUSTOM PROCEDURE TRAY) ×2 IMPLANT
KIT TURNOVER KIT B (KITS) ×2 IMPLANT
NDL SPNL 22GX3.5 QUINCKE BK (NEEDLE) ×2 IMPLANT
NEEDLE HYPO 22GX1.5 SAFETY (NEEDLE) ×2 IMPLANT
NEEDLE SPNL 22GX3.5 QUINCKE BK (NEEDLE) ×1 IMPLANT
NS IRRIG 1000ML POUR BTL (IV SOLUTION) ×2 IMPLANT
PACK LAMINECTOMY NEURO (CUSTOM PROCEDURE TRAY) ×2 IMPLANT
SPIKE FLUID TRANSFER (MISCELLANEOUS) ×2 IMPLANT
SPONGE SURGIFOAM ABS GEL SZ50 (HEMOSTASIS) ×2 IMPLANT
STRIP CLOSURE SKIN 1/2X4 (GAUZE/BANDAGES/DRESSINGS) ×2 IMPLANT
SUT VIC AB 0 CT1 18XCR BRD8 (SUTURE) ×2 IMPLANT
SUT VIC AB 0 CT1 8-18 (SUTURE) ×1
SUT VIC AB 2-0 CT1 18 (SUTURE) ×2 IMPLANT
SUT VICRYL 4-0 PS2 18IN ABS (SUTURE) ×2 IMPLANT
TOWEL GREEN STERILE (TOWEL DISPOSABLE) ×2 IMPLANT
TOWEL GREEN STERILE FF (TOWEL DISPOSABLE) ×2 IMPLANT
WATER STERILE IRR 1000ML POUR (IV SOLUTION) ×2 IMPLANT

## 2023-02-17 NOTE — Transfer of Care (Signed)
Immediate Anesthesia Transfer of Care Note  Patient: [name]  Procedure(s) Performed: Sublaminar Decompression - Thoracic Twelve-Lumbar One Bilateral with Microdiscectomy (Bilateral: Back)  Patient Location: PACU  Anesthesia Type:General  Level of Consciousness: drowsy, patient cooperative, and responds to stimulation  Airway & Oxygen Therapy: Patient Spontanous Breathing and Patient connected to face mask oxygen  Post-op Assessment: Report given to RN, Post -op Vital signs reviewed and stable, and Patient moving all extremities X 4  Post vital signs: Reviewed and stable  Last Vitals:  Vitals Value Taken Time  BP 191/73 02/17/23 1226  Temp    Pulse 73 02/17/23 1227  Resp 16 02/17/23 1227  SpO2 98 % 02/17/23 1227  Vitals shown include unvalidated device data.  Last Pain:  Vitals:   02/17/23 0804  TempSrc: Oral  PainSc: 8          Complications: No notable events documented.

## 2023-02-17 NOTE — Op Note (Signed)
Preoperative diagnosis: Lumbar spinal stenosis T12-L1 hernia nucleus pulposus T12-L1.  Procedure: Decompressive lumbar laminectomy T12-L1 with bilateral partial medial facetectomies and foraminotomies of the T12 and L1 nerve roots.  2.  Lumbar microdiscectomy T12-L1 from the left with microscopic discectomy and foraminotomy.  Surgeon: Kary Kos.  Assistant: Nash Shearer.  Anesthesia: General.  EBL: Minimal.  HPI: 82 year old gentleman progressive worsening back bilateral hip and leg pain numbness and tingling and difficulty walking.  Workup revealed disc herniation severe spinal stenosis T12-L1.  Due to patient's progression of clinical syndrome imaging findings and failed conservative treatment I recommended decompressive laminectomy and microdiscectomy at that level.  I extensively reviewed the risks and benefits of the procedure with him as well as perioperative course expectations of outcome and alternatives of surgery and he understood and agreed to proceed forward.  Operative procedure: Patient was brought into the OR was induced under general anesthesia positioned prone the Wilson frame his back was prepped and draped in routine sterile fashion his old incision was localized as a landmark and extended slightly cephalad subperiosteal dissection was carried lamina of T12 and L1 confirmed by interoperative x-ray so the spinous process at T12 was removed as well as superior aspect spinous process at L1 since decompression was begun marked facet arthropathy was causing hourglass compression of thecal sac this was all teased away ligament flavum was markedly hypertrophied this was all removed decompressing the central canal.  Identified both the T12 and L1 foramen and unroofed the foramen.  I then at this point the operating microscope was draped and brought into the field under microscopic lamination working from the left side identified the disc base removed several large fragments of disc in  the central compartment however I was not able to remove all the disc as it was very central and due to this location I could not reach flecked and retract the cecum thecal sac anymore.  In addition I did not want to destabilize the facet joint anymore either in order to get more lateral trajectory so due to the bilateral nature of adequate decompression I felt that it was not the patient's best interest to further try to get additional disc fragment due to the risk of retraction.  So after adequate decompression been achieved and significant disc was removed the wound was then copiously irrigated meticulous hemostasis was maintained Gelfoam was ON top of the dura and the muscle fascia approximate layers with active Vicryl skin was closed running 4 subcuticular Dermabond benzoin Steri-Strips and sterile dressing was applied patient recovery in stable condition.  At the end of the case all needle counts and sponge counts were correct.

## 2023-02-17 NOTE — Anesthesia Postprocedure Evaluation (Signed)
Anesthesia Post Note  Patient: [name]  Procedure(s) Performed: Sublaminar Decompression - Thoracic Twelve-Lumbar One Bilateral with Microdiscectomy (Bilateral: Back)     Patient location during evaluation: PACU Anesthesia Type: General Level of consciousness: awake and alert Pain management: satisfactory to patient Vital Signs Assessment: post-procedure vital signs reviewed and stable Respiratory status: spontaneous breathing, nonlabored ventilation and respiratory function stable Cardiovascular status: stable and blood pressure returned to baseline Anesthetic complications: no   No notable events documented.  Last Vitals:  Vitals:   02/17/23 1415 02/17/23 1430  BP: (!) 169/64 (!) 163/64  Pulse: 64 63  Resp: 14 15  Temp:    SpO2: 95% 95%     LLE Motor Response: Purposeful movement (02/17/23 1430) LLE Sensation: Full sensation (02/17/23 1430) RLE Motor Response: Purposeful movement (02/17/23 1430) RLE Sensation: Full sensation (02/17/23 1430)      Audry Pili
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
## 2023-02-17 NOTE — Anesthesia Procedure Notes (Signed)
Procedure Name: Intubation Date/Time: 02/17/2023 10:34 AM  Performed by: Michele Rockers, CRNAPre-anesthesia Checklist: Patient identified, Patient being monitored, Timeout performed, Emergency Drugs available and Suction available Patient Re-evaluated:Patient Re-evaluated prior to induction Oxygen Delivery Method: Circle System Utilized Preoxygenation: Pre-oxygenation with 100% oxygen Induction Type: IV induction Ventilation: Mask ventilation without difficulty Laryngoscope Size: Miller and 2 Grade View: Grade I Tube type: Oral Tube size: 7.5 mm Number of attempts: 1 Airway Equipment and Method: Stylet Placement Confirmation: ETT inserted through vocal cords under direct vision, positive ETCO2 and breath sounds checked- equal and bilateral Secured at: 22 cm Tube secured with: Tape Dental Injury: Teeth and Oropharynx as per pre-operative assessment

## 2023-02-18 ENCOUNTER — Encounter (HOSPITAL_COMMUNITY): Payer: Self-pay | Admitting: Neurosurgery

## 2023-02-18 DIAGNOSIS — G4730 Sleep apnea, unspecified: Secondary | ICD-10-CM | POA: Diagnosis not present

## 2023-02-18 DIAGNOSIS — I10 Essential (primary) hypertension: Secondary | ICD-10-CM | POA: Diagnosis not present

## 2023-02-18 DIAGNOSIS — I252 Old myocardial infarction: Secondary | ICD-10-CM | POA: Diagnosis not present

## 2023-02-18 DIAGNOSIS — K219 Gastro-esophageal reflux disease without esophagitis: Secondary | ICD-10-CM | POA: Diagnosis not present

## 2023-02-18 DIAGNOSIS — I739 Peripheral vascular disease, unspecified: Secondary | ICD-10-CM | POA: Diagnosis not present

## 2023-02-18 DIAGNOSIS — M48061 Spinal stenosis, lumbar region without neurogenic claudication: Secondary | ICD-10-CM | POA: Diagnosis not present

## 2023-02-18 MED ORDER — HYDROCODONE-ACETAMINOPHEN 5-325 MG PO TABS: 1.0000 | ORAL_TABLET | ORAL | 0 refills | Status: AC | PRN

## 2023-02-18 MED ORDER — METHOCARBAMOL 750 MG PO TABS: 750.0000 mg | ORAL_TABLET | Freq: Four times a day (QID) | ORAL | 0 refills | Status: DC

## 2023-02-18 MED FILL — Thrombin For Soln 5000 Unit: CUTANEOUS | Qty: 2 | Status: AC

## 2023-02-18 NOTE — Evaluation (Signed)
Occupational Therapy Evaluation Patient Details Name: [name] MRN: [id number] DOB: 21-Aug-1941 Today's Date: 02/18/2023   History of Present Illness Pt is an 82 y/o M presenting with BLE weakness, s/p decompressive lumbar laminectomy T12-L1 with bilateral partial medial facetectomies and foraminectomies of T12 and L1 nerve roots. PMH includes anxiety, cataracts, DJD, depression, GERD, HOH, HLD, HTN, PTSD, sleep apnea, ACDF, lumbar fusion.   Clinical Impression   Pt reports independence at baseline with ADLs and uses cane for functional mobility. Pt lives with spouse who has impaired vision and he frequently assists her, however reports he has good support from neighbors if needed. Pt needing mod I -min A for ADLs, mod I for bed mobility, and supervision for transfers with [facility]. Pt educated on back precautions, and compensatory strategies for ADLs and bed mobility (handout provided), pt verbalized understanding. Pt presenting with impairments listed below, will follow acutely. Anticipate no OT follow up needs at d/c.      Recommendations for follow up therapy are one component of a multi-disciplinary discharge planning process, led by the attending physician.  Recommendations may be updated based on patient status, additional functional criteria and insurance authorization.   Follow Up Recommendations  No OT follow up     Assistance Recommended at Discharge Intermittent Supervision/Assistance  Patient can return home with the following Assist for transportation;Assistance with cooking/housework;A little help with bathing/dressing/bathroom    Functional Status Assessment  Patient has had a recent decline in their functional status and demonstrates the ability to make significant improvements in function in a reasonable and predictable amount of time.  Equipment Recommendations  None recommended by OT (pt has all needed DME)    Recommendations for Other Services       Precautions /  Restrictions Precautions Precautions: Back Precaution Booklet Issued: Yes (comment) Precaution Comments: educated pt on 3/3 back prec Required Braces or Orthoses: Other Brace Other Brace: no brace needed per MD Restrictions Weight Bearing Restrictions: No      Mobility Bed Mobility Overal bed mobility: Modified Independent             General bed mobility comments: use of log roll technique    Transfers Overall transfer level: Needs assistance Equipment used: Straight cane Transfers: Sit to/from Stand Sit to Stand: Supervision                  Balance Overall balance assessment: Mild deficits observed, not formally tested                                         ADL either performed or assessed with clinical judgement   ADL Overall ADL's : Needs assistance/impaired Eating/Feeding: Modified independent   Grooming: Modified independent   Upper Body Bathing: Supervision/ safety   Lower Body Bathing: Minimal assistance   Upper Body Dressing : Supervision/safety   Lower Body Dressing: Minimal assistance;Adhering to back precautions   Toilet Transfer: Min guard   Toileting- Clothing Manipulation and Hygiene: Supervision/safety       Functional mobility during ADLs: Min guard;Cane       Vision   Vision Assessment?: No apparent visual deficits     Perception Perception Perception Tested?: No   Praxis Praxis Praxis tested?: Not tested    Pertinent Vitals/Pain Pain Assessment Pain Assessment: No/denies pain     Hand Dominance Right   Extremity/Trunk Assessment Upper Extremity Assessment Upper Extremity Assessment:  Generalized weakness   Lower Extremity Assessment Lower Extremity Assessment: Defer to PT evaluation   Cervical / Trunk Assessment Cervical / Trunk Assessment: Back Surgery   Communication Communication Communication: No difficulties   Cognition Arousal/Alertness: Awake/alert Behavior During Therapy: WFL  for tasks assessed/performed Overall Cognitive Status: Within Functional Limits for tasks assessed                                 General Comments: tangential at times     General Comments  VSS on RA    Exercises     Shoulder Instructions      Home Living Family/patient expects to be discharged to:: Private residence Living Arrangements: Spouse/significant other Available Help at Discharge: Neighbor;Available PRN/intermittently Type of Home: House Home Access: Stairs to enter [employer] of Steps: 1   Home Layout: One level     Bathroom Shower/Tub: Occupational psychologist: Standard     Home Equipment: Adaptive equipment;Cane - single point;Shower seat Adaptive Equipment: Long-handled shoe horn;Long-handled sponge        Prior Functioning/Environment Prior Level of Function : Independent/Modified Independent             Mobility Comments: use of cane ADLs Comments: ind        OT Problem List: Decreased strength;Decreased range of motion;Decreased activity tolerance;Impaired balance (sitting and/or standing)      OT Treatment/Interventions: Self-care/ADL training;Therapeutic exercise;Energy conservation;DME and/or AE instruction;Therapeutic activities;Patient/family education;Balance training    OT Goals(Current goals can be found in the care plan section) Acute Rehab OT Goals Patient Stated Goal: none stated OT Goal Formulation: With patient Time For Goal Achievement: 03/04/23 Potential to Achieve Goals: Good ADL Goals Pt Will Perform Lower Body Dressing: Independently;sit to/from stand Pt Will Transfer to Toilet: Independently;ambulating;regular height toilet Pt Will Perform Tub/Shower Transfer: Tub transfer;Shower transfer;Independently;ambulating;shower seat  OT Frequency: Min 2X/week    Co-evaluation              AM-PAC OT "6 Clicks" Daily Activity     Outcome Measure Help from another person eating  meals?: None Help from another person taking care of personal grooming?: None Help from another person toileting, which includes using toliet, bedpan, or urinal?: None Help from another person bathing (including washing, rinsing, drying)?: A Little Help from another person to put on and taking off regular upper body clothing?: None Help from another person to put on and taking off regular lower body clothing?: A Little 6 Click Score: 22   End of Session Equipment Utilized During Treatment: Other (comment) (cane) Nurse Communication: Mobility status  Activity Tolerance: Patient tolerated treatment well Patient left: in bed;with call bell/phone within reach  OT Visit Diagnosis: Unsteadiness on feet (R26.81);Other abnormalities of gait and mobility (R26.89);Muscle weakness (generalized) (M62.81)                Time: [id number] OT Time Calculation (min): 30 min Charges:  OT General Charges $OT Visit: 1 Visit OT Evaluation $OT Eval Low Complexity: 1 Low OT Treatments $Self Care/Home Management : 8-22 mins  Renaye Rakers, OTD, OTR/L SecureChat Preferred Acute Rehab [phone]   Ulla Gallo 02/18/2023, 8:53 AM

## 2023-02-18 NOTE — Discharge Summary (Signed)
Physician Discharge Summary  Patient ID: [name] MRN: [id number] DOB/AGE: 05/11/1941 82 y.o.  Admit date: 02/17/2023 Discharge date: 02/18/2023  Admission Diagnoses:  Lumbar spinal stenosis T12-L1 hernia nucleus pulposus T12-L1.    Discharge Diagnoses: same   Discharged Condition: good  Hospital Course: The patient was admitted on 02/17/2023 and taken to the operating room where the patient underwent T12-L1 lami and microdiskectomy. The patient tolerated the procedure well and was taken to the recovery room and then to the floor in stable condition. The hospital course was routine. There were no complications. The wound remained clean dry and intact. Pt had appropriate back soreness. No complaints of leg pain or new N/T/W. The patient remained afebrile with stable vital signs, and tolerated a regular diet. The patient continued to increase activities, and pain was well controlled with oral pain medications.   Consults: None  Significant Diagnostic Studies:  Results for orders placed or performed during the hospital encounter of 02/17/23  Surgical pcr screen   Specimen: Nasal Mucosa; Nasal Swab  Result Value Ref Range   MRSA, PCR NEGATIVE NEGATIVE   Staphylococcus aureus NEGATIVE NEGATIVE    DG Lumbar Spine 2-3 Views  Result Date: 02/17/2023 CLINICAL DATA:  Intraoperative localization EXAM: LUMBAR SPINE - 2-3 VIEW COMPARISON:  None Available. FINDINGS: Intraoperative lateral radiographs of the lumbar spine demonstrate marking instruments overlying the T12 vertebral body dorsally and subsequently of the L1 vertebral body. Lumbar fusion hardware of L2 through L5. IMPRESSION: Intraoperative lateral radiographs of the lumbar spine demonstrate marking instruments overlying the T12 vertebral body dorsally and subsequently of the L1 vertebral body. Electronically Signed   By: Delanna Ahmadi M.D.   On: 02/17/2023 12:33    Antibiotics:  Anti-infectives (From admission, onward)    Start      Dose/Rate Route Frequency Ordered Stop   02/17/23 2200  valACYclovir (VALTREX) tablet 1,000 mg       Note to Pharmacy: TAKE 2 TABLETS TWICE A DAY FOR 1 DAY WITH THE ONSET OF FEVER BLISTERS     1,000 mg Oral 2 times daily 02/17/23 1504     02/17/23 1800  ceFAZolin (ANCEF) IVPB 2g/100 mL premix        2 g 200 mL/hr over 30 Minutes Intravenous Every 8 hours 02/17/23 1506 02/18/23 0209   02/17/23 1500  ceFAZolin (ANCEF) IVPB 2g/100 mL premix  Status:  Discontinued        2 g 200 mL/hr over 30 Minutes Intravenous Every 8 hours 02/17/23 1446 02/17/23 1526   02/17/23 1023  ceFAZolin (ANCEF) 2-4 GM/100ML-% IVPB       Note to Pharmacy: Rennie Natter (: cabinet override      02/17/23 1023 02/17/23 2229       Discharge Exam: Blood pressure (!) 114/58, pulse 95, temperature 98.3 F (36.8 C), temperature source Oral, resp. rate 20, height '5\' 7"'$  (1.702 m), weight 80.3 kg, SpO2 94 %. Neurologic: Grossly normal Ambulating and voiding well incision cdi   Discharge Medications:   Allergies as of 02/18/2023       Reactions   Calcium Channel Blockers Other (See Comments)   REACTION: edema   Felodipine Other (See Comments)   REACTION: INCREASED HEART RATE   Hydrocodone-acetaminophen Other (See Comments)   Flashbacks   Other Other (See Comments)   Take all medication with apple sauce   Oxycodone-acetaminophen Other (See Comments)   Other reaction(s): Unknown   Penicillins Other (See Comments)   REACTION: HIVES   Tylenol [acetaminophen]  Other (See Comments)   Had a psychotic episode   Vioxx [rofecoxib] Other (See Comments)   Flash backs from war        Medication List     STOP taking these medications    traMADol 50 MG tablet Commonly known as: ULTRAM       TAKE these medications    atorvastatin 10 MG tablet Commonly known as: LIPITOR TAKE 1 TABLET DAILY   CALCIUM 1200 PO Take 1 tablet by mouth in the morning. Gel cap   carvedilol 3.125 MG tablet Commonly known as:  COREG TAKE 1 TABLET TWICE A DAY WITH MEALS (SCHEDULE APPOINTMENT FOR REFILL)   CHELATED MAGNESIUM PO Take 200 mg by mouth daily.   CINNAMON PO Take 4,000 mg by mouth daily.   ezetimibe 10 MG tablet Commonly known as: ZETIA Take 1 tablet (10 mg total) by mouth daily.   folic acid 1 MG tablet Commonly known as: FOLVITE Take 1 mg by mouth daily.   GLUCOSAMINE 1500 COMPLEX PO Take 1,200 mg by mouth every morning.   HM Vitamin D3 100 MCG (4000 UT) Caps Generic drug: Cholecalciferol Take 20,000 Units by mouth in the morning.   hydrochlorothiazide 25 MG tablet Commonly known as: HYDRODIURIL TAKE 1 TABLET DAILY   HYDROcodone-acetaminophen 5-325 MG tablet Commonly known as: NORCO/VICODIN Take 1 tablet by mouth every 4 (four) hours as needed for moderate pain.   loratadine 10 MG tablet Commonly known as: CLARITIN Take 10 mg by mouth daily.   methocarbamol 750 MG tablet Commonly known as: Robaxin-750 Take 1 tablet (750 mg total) by mouth 4 (four) times daily.   methotrexate 2.5 MG tablet Commonly known as: RHEUMATREX Take 20 mg by mouth every Thursday.   multivitamin per tablet Take 1 tablet by mouth every morning. Centrum 50+   OVER THE COUNTER MEDICATION Take 2 tablets by mouth 2 (two) times daily. Herp rescue   PreserVision AREDS 2 Caps Take 1 capsule by mouth 2 (two) times daily.   PROBIOTIC ACIDOPHILUS PO Take 3 capsules by mouth daily.   telmisartan 40 MG tablet Commonly known as: MICARDIS TAKE ONE-HALF (1/2) TABLET DAILY (AFTER CUTTING DISCARD THE UNUSED PARTIAL TABLET) What changed: See the new instructions.   topiramate 25 MG tablet Commonly known as: TOPAMAX Take 25 mg by mouth 2 (two) times daily.   valACYclovir 1000 MG tablet Commonly known as: VALTREX TAKE 2 TABLETS TWICE A DAY FOR 1 DAY WITH THE ONSET OF FEVER BLISTERS What changed:  how much to take how to take this when to take this additional instructions               Discharge  Care Instructions  (From admission, onward)           Start     Ordered   02/18/23 0000  If the dressing is still on your incision site when you go home, remove it on the third day after your surgery date. Remove dressing if it begins to fall off, or if it is dirty or damaged before the third day.        02/18/23 0746            Disposition: home   Final Dx: T12-L1 lami and microdiskectomy  Discharge Instructions     Call MD for:  difficulty breathing, headache or visual disturbances   Complete by: As directed    Call MD for:  hives   Complete by: As directed    Call MD for:  persistant dizziness or light-headedness   Complete by: As directed    Call MD for:  persistant nausea and vomiting   Complete by: As directed    Call MD for:  redness, tenderness, or signs of infection (pain, swelling, redness, odor or green/yellow discharge around incision site)   Complete by: As directed    Call MD for:  severe uncontrolled pain   Complete by: As directed    Call MD for:  temperature >100.4   Complete by: As directed    Diet - low sodium heart healthy   Complete by: As directed    Driving Restrictions   Complete by: As directed    No driving for 2 weeks, no riding in the car for 1 week   If the dressing is still on your incision site when you go home, remove it on the third day after your surgery date. Remove dressing if it begins to fall off, or if it is dirty or damaged before the third day.   Complete by: As directed    Increase activity slowly   Complete by: As directed    Lifting restrictions   Complete by: As directed    No lifting more than 8 lbs          Signed: Ocie Cornfield [facility] 02/18/2023, 7:46 AM

## 2023-02-18 NOTE — Plan of Care (Signed)
  Problem: Education: Goal: Ability to verbalize activity precautions or restrictions will improve Outcome: Completed/Met Goal: Knowledge of the prescribed therapeutic regimen will improve Outcome: Completed/Met Goal: Understanding of discharge needs will improve Outcome: Completed/Met   Problem: Activity: Goal: Ability to avoid complications of mobility impairment will improve Outcome: Completed/Met Goal: Ability to tolerate increased activity will improve Outcome: Completed/Met Goal: Will remain free from falls Outcome: Completed/Met   Problem: Bowel/Gastric: Goal: Gastrointestinal status for postoperative course will improve Outcome: Completed/Met   Problem: Clinical Measurements: Goal: Ability to maintain clinical measurements within normal limits will improve Outcome: Completed/Met Goal: Postoperative complications will be avoided or minimized Outcome: Completed/Met Goal: Diagnostic test results will improve Outcome: Completed/Met   Problem: Pain Management: Goal: Pain level will decrease Outcome: Completed/Met   Problem: Skin Integrity: Goal: Will show signs of wound healing Outcome: Completed/Met   Problem: Health Behavior/Discharge Planning: Goal: Identification of resources available to assist in meeting health care needs will improve Outcome: Completed/Met   Problem: Bladder/Genitourinary: Goal: Urinary functional status for postoperative course will improve Outcome: Completed/Met  Patient alert and oriented, void, ambulate, VSS. D/c instructions explain and given to the patient, all questions answered. Pt. D/c home per order.

## 2023-03-03 ENCOUNTER — Other Ambulatory Visit: Payer: Self-pay | Admitting: Cardiovascular Disease

## 2023-03-03 NOTE — Telephone Encounter (Signed)
Refill request

## 2023-03-04 DIAGNOSIS — M7062 Trochanteric bursitis, left hip: Secondary | ICD-10-CM | POA: Diagnosis not present

## 2023-03-17 ENCOUNTER — Encounter: Payer: Self-pay | Admitting: Internal Medicine

## 2023-03-17 ENCOUNTER — Ambulatory Visit (INDEPENDENT_AMBULATORY_CARE_PROVIDER_SITE_OTHER): Payer: Medicare Other | Admitting: Internal Medicine

## 2023-03-17 VITALS — BP 128/84 | HR 66 | Temp 98.0°F | Resp 16 | Ht 67.0 in | Wt 171.4 lb

## 2023-03-17 DIAGNOSIS — F32A Depression, unspecified: Secondary | ICD-10-CM

## 2023-03-17 DIAGNOSIS — I701 Atherosclerosis of renal artery: Secondary | ICD-10-CM | POA: Diagnosis not present

## 2023-03-17 DIAGNOSIS — I779 Disorder of arteries and arterioles, unspecified: Secondary | ICD-10-CM

## 2023-03-17 DIAGNOSIS — I10 Essential (primary) hypertension: Secondary | ICD-10-CM

## 2023-03-17 DIAGNOSIS — R739 Hyperglycemia, unspecified: Secondary | ICD-10-CM

## 2023-03-17 DIAGNOSIS — R399 Unspecified symptoms and signs involving the genitourinary system: Secondary | ICD-10-CM | POA: Diagnosis not present

## 2023-03-17 DIAGNOSIS — R634 Abnormal weight loss: Secondary | ICD-10-CM

## 2023-03-17 LAB — URINALYSIS, ROUTINE W REFLEX MICROSCOPIC
Bilirubin Urine: NEGATIVE
Hgb urine dipstick: NEGATIVE
Ketones, ur: NEGATIVE
Leukocytes,Ua: NEGATIVE
Nitrite: NEGATIVE
RBC / HPF: NONE SEEN (ref 0–?)
Specific Gravity, Urine: 1.025 (ref 1.000–1.030)
Total Protein, Urine: NEGATIVE
Urine Glucose: NEGATIVE
Urobilinogen, UA: 0.2 (ref 0.0–1.0)
pH: 6 (ref 5.0–8.0)

## 2023-03-17 LAB — BASIC METABOLIC PANEL
BUN: 20 mg/dL (ref 6–23)
CO2: 28 mEq/L (ref 19–32)
Calcium: 9.9 mg/dL (ref 8.4–10.5)
Chloride: 100 mEq/L (ref 96–112)
Creatinine, Ser: 0.99 mg/dL (ref 0.40–1.50)
GFR: 71.28 mL/min (ref >60.00)
Glucose, Bld: 121 mg/dL — ABNORMAL HIGH (ref 70–99)
Potassium: 4.3 mEq/L (ref 3.5–5.1)
Sodium: 135 mEq/L (ref 135–145)

## 2023-03-17 LAB — HEMOGLOBIN A1C: Hgb A1c MFr Bld: 6.5 % (ref 4.6–6.5)

## 2023-03-17 NOTE — Patient Instructions (Addendum)
Vaccines I recommend:  Covid booster RSV vaccine  Please let me know if you need physical therapy to help your stability.   Continue checking your blood pressures BP GOAL is between 110/65 and  135/85. If it is consistently higher or lower, let me know      GO TO THE LAB : Get the blood work     [place], [place] back for a checkup in 3 months

## 2023-03-17 NOTE — Assessment & Plan Note (Signed)
Prediabetes: Check A1c. HTN: Ambulatory BPs all within normal, continue present care.  Check BMP Depression: PHQ-9 --- 15, moderate depression, he is concerned about his wife's health, he has become a caregiver.  Medication?  Patient declines. MSK: 01-2023: Laminectomy T12-L1, low back pain essentially resolved but has  left leg numbness, symptoms managed by neurosurgery. Cardiovascular: Saw cardiology 01-2023. -Unilateral renal artery stenosis: BP noted to be controlled -Carotid artery disease, next carotid Doppler 08-2023. -Aortic stenosis: Echo was ordered Headaches: No headaches recently, not on topamax. Urinary frequency: New symptoms, no fever or chills, check UA urine culture. Decreased appetite, + weight loss: Patient developed anorexia after surgery last month, some weight loss noted.  Appetite is just coming back.  Check TSH.  Reassess on RTC Fall prevention, no recent falls but he stumbles . offered PT, declined for now. RTC 3 months
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
## 2023-03-18 LAB — TSH: TSH: 1.01 u[IU]/mL (ref 0.35–5.50)

## 2023-03-18 LAB — URINE CULTURE
MICRO NUMBER:: 14736271
Result:: NO GROWTH
SPECIMEN QUALITY:: ADEQUATE

## 2023-04-10 LAB — HM DIABETES EYE EXAM

## 2023-04-22 ENCOUNTER — Ambulatory Visit (HOSPITAL_COMMUNITY): Payer: Medicare Other | Attending: Cardiovascular Disease

## 2023-04-22 DIAGNOSIS — I350 Nonrheumatic aortic (valve) stenosis: Secondary | ICD-10-CM | POA: Insufficient documentation

## 2023-04-23 ENCOUNTER — Telehealth: Payer: Self-pay | Admitting: Internal Medicine

## 2023-04-23 LAB — ECHOCARDIOGRAM COMPLETE
AR max vel: 1.87 cm2
AV Area VTI: 2.02 cm2
AV Area mean vel: 1.81 cm2
AV Mean grad: 14 mmHg
AV Peak grad: 26.2 mmHg
Ao pk vel: 2.56 m/s
Area-P 1/2: 2.87 cm2
S' Lateral: 3 cm

## 2023-04-23 NOTE — Telephone Encounter (Signed)
Copied from CRM [id number]. Topic: Medicare AWV >> Apr 23, 2023 10:20 AM Payton Doughty wrote: Reason for CRM: Called patient to schedule Medicare Annual Wellness Visit (AWV). Left message for patient to call back and schedule Medicare Annual Wellness Visit (AWV).  Last date of AWV: 05/07/22  Please schedule an appointment at any time with Donne Anon, CMA  .  If any questions, please contact me.  Thank you ,  Verlee Rossetti; Care Guide Ambulatory Clinical Support [place] [facility] Direct Dial: [phone]

## 2023-04-23 NOTE — Telephone Encounter (Signed)
Copied from CRM [id number]. Topic: Medicare AWV >> Apr 23, 2023 10:16 AM Payton Doughty wrote: Reason for CRM: Called patient to schedule Medicare Annual Wellness Visit (AWV). Left message for patient to call back and schedule Medicare Annual Wellness Visit (AWV).  Last date of AWV: 05/07/22  Please schedule an appointment at any time with Donne Anon, CMA  .  If any questions, please contact me.  Thank you ,  Verlee Rossetti; Care Guide Ambulatory Clinical Support [place] [facility] Direct Dial: [phone]

## 2023-05-06 ENCOUNTER — Other Ambulatory Visit: Payer: Self-pay | Admitting: Cardiovascular Disease

## 2023-05-06 DIAGNOSIS — Z79899 Other long term (current) drug therapy: Secondary | ICD-10-CM | POA: Diagnosis not present

## 2023-05-06 DIAGNOSIS — E663 Overweight: Secondary | ICD-10-CM | POA: Diagnosis not present

## 2023-05-06 DIAGNOSIS — M5030 Other cervical disc degeneration, unspecified cervical region: Secondary | ICD-10-CM | POA: Diagnosis not present

## 2023-05-06 DIAGNOSIS — Z6827 Body mass index (BMI) 27.0-27.9, adult: Secondary | ICD-10-CM | POA: Diagnosis not present

## 2023-05-06 DIAGNOSIS — M0609 Rheumatoid arthritis without rheumatoid factor, multiple sites: Secondary | ICD-10-CM | POA: Diagnosis not present

## 2023-05-06 DIAGNOSIS — M5136 Other intervertebral disc degeneration, lumbar region: Secondary | ICD-10-CM | POA: Diagnosis not present

## 2023-05-06 NOTE — Telephone Encounter (Signed)
Refill request

## 2023-05-16 ENCOUNTER — Encounter: Payer: Self-pay | Admitting: Internal Medicine

## 2023-05-16 DIAGNOSIS — M7989 Other specified soft tissue disorders: Secondary | ICD-10-CM

## 2023-05-16 NOTE — Telephone Encounter (Signed)
STAT order placed, message sent by PCP to call imaging to schedule at [phone].

## 2023-05-16 NOTE — Telephone Encounter (Signed)
Left leg ultrasound

## 2023-05-18 ENCOUNTER — Ambulatory Visit (HOSPITAL_BASED_OUTPATIENT_CLINIC_OR_DEPARTMENT_OTHER)
Admission: RE | Admit: 2023-05-18 | Discharge: 2023-05-18 | Disposition: A | Discharge status: Home / Self Care | Payer: Medicare Other | Source: Ambulatory Visit | Attending: Internal Medicine | Admitting: Internal Medicine

## 2023-05-18 DIAGNOSIS — M7989 Other specified soft tissue disorders: Secondary | ICD-10-CM | POA: Diagnosis not present

## 2023-05-18 DIAGNOSIS — M79662 Pain in left lower leg: Secondary | ICD-10-CM | POA: Insufficient documentation

## 2023-05-27 ENCOUNTER — Encounter: Payer: Self-pay | Admitting: Cardiovascular Disease

## 2023-06-17 ENCOUNTER — Ambulatory Visit (INDEPENDENT_AMBULATORY_CARE_PROVIDER_SITE_OTHER): Payer: Medicare Other | Admitting: Internal Medicine

## 2023-06-17 ENCOUNTER — Encounter: Payer: Self-pay | Admitting: Internal Medicine

## 2023-06-17 VITALS — BP 134/76 | HR 78 | Temp 97.6°F | Resp 16 | Ht 67.0 in | Wt 169.0 lb

## 2023-06-17 DIAGNOSIS — I10 Essential (primary) hypertension: Secondary | ICD-10-CM | POA: Diagnosis not present

## 2023-06-17 DIAGNOSIS — M79662 Pain in left lower leg: Secondary | ICD-10-CM

## 2023-06-17 DIAGNOSIS — M7989 Other specified soft tissue disorders: Secondary | ICD-10-CM

## 2023-06-17 DIAGNOSIS — F32A Depression, unspecified: Secondary | ICD-10-CM

## 2023-06-17 DIAGNOSIS — R634 Abnormal weight loss: Secondary | ICD-10-CM

## 2023-06-17 MED ORDER — LIDOCAINE 5 % EX PTCH: 1.0000 | MEDICATED_PATCH | Freq: Every day | CUTANEOUS | 2 refills | Status: AC | PRN

## 2023-06-17 MED ORDER — TELMISARTAN 40 MG PO TABS: 40.0000 mg | ORAL_TABLET | Freq: Every day | ORAL | Status: DC

## 2023-06-17 NOTE — Assessment & Plan Note (Signed)
HTN: Ambulatory BPs WNL.  Last BMP okay, continue carvedilol, HCTZ, telmisartan. Decreased appetite, + weight loss: Has dropped only a couple pounds in the last 3 months. Depression: PHQ-9 improve compared to last visit. L lower extremity edema: Unchanged, recent ultrasound negative for DVT, this is a chronic issue, possibly related to varicose veins, recommend compression stockings or in the morning. MSK: Still has pain on the left leg in radiculopathy fashion.  Recommend to discuss with neurosurgery. RTC 4 months

## 2023-06-17 NOTE — Patient Instructions (Addendum)
Vaccines I recommend: Covid booster RSV vaccine Flu shot this fall  Continue the same medications  Continue checking your blood pressure BP GOAL is between 110/65 and  135/85. If it is consistently higher or lower, let me know      GO TO THE FRONT DESK, PLEASE SCHEDULE YOUR APPOINTMENTS Come back for checkup in 4 months
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
## 2023-07-07 ENCOUNTER — Other Ambulatory Visit: Payer: Self-pay | Admitting: Internal Medicine

## 2023-07-22 ENCOUNTER — Encounter: Payer: Self-pay | Admitting: Internal Medicine

## 2023-07-29 DIAGNOSIS — G959 Disease of spinal cord, unspecified: Secondary | ICD-10-CM | POA: Diagnosis not present

## 2023-07-29 DIAGNOSIS — M5440 Lumbago with sciatica, unspecified side: Secondary | ICD-10-CM | POA: Diagnosis not present

## 2023-08-05 ENCOUNTER — Other Ambulatory Visit: Payer: Self-pay | Admitting: Cardiovascular Disease

## 2023-08-05 NOTE — Telephone Encounter (Signed)
Refill Request.  

## 2023-08-13 DIAGNOSIS — M5416 Radiculopathy, lumbar region: Secondary | ICD-10-CM | POA: Diagnosis not present

## 2023-08-14 DIAGNOSIS — Z79899 Other long term (current) drug therapy: Secondary | ICD-10-CM | POA: Diagnosis not present

## 2023-08-14 DIAGNOSIS — M5030 Other cervical disc degeneration, unspecified cervical region: Secondary | ICD-10-CM | POA: Diagnosis not present

## 2023-08-14 DIAGNOSIS — E663 Overweight: Secondary | ICD-10-CM | POA: Diagnosis not present

## 2023-08-14 DIAGNOSIS — M0609 Rheumatoid arthritis without rheumatoid factor, multiple sites: Secondary | ICD-10-CM | POA: Diagnosis not present

## 2023-08-14 DIAGNOSIS — M5136 Other intervertebral disc degeneration, lumbar region: Secondary | ICD-10-CM | POA: Diagnosis not present

## 2023-08-14 DIAGNOSIS — Z6827 Body mass index (BMI) 27.0-27.9, adult: Secondary | ICD-10-CM | POA: Diagnosis not present

## 2023-09-01 ENCOUNTER — Other Ambulatory Visit: Payer: Self-pay | Admitting: Cardiovascular Disease

## 2023-09-02 ENCOUNTER — Encounter: Payer: Self-pay | Admitting: Cardiovascular Disease

## 2023-09-02 ENCOUNTER — Ambulatory Visit: Payer: Medicare Other | Attending: Cardiovascular Disease | Admitting: Cardiovascular Disease

## 2023-09-02 VITALS — BP 194/90 | HR 57 | Ht 67.0 in | Wt 170.4 lb

## 2023-09-02 DIAGNOSIS — I10 Essential (primary) hypertension: Secondary | ICD-10-CM | POA: Insufficient documentation

## 2023-09-02 DIAGNOSIS — E785 Hyperlipidemia, unspecified: Secondary | ICD-10-CM | POA: Diagnosis not present

## 2023-09-02 DIAGNOSIS — I701 Atherosclerosis of renal artery: Secondary | ICD-10-CM | POA: Diagnosis not present

## 2023-09-02 DIAGNOSIS — I872 Venous insufficiency (chronic) (peripheral): Secondary | ICD-10-CM | POA: Insufficient documentation

## 2023-09-02 DIAGNOSIS — I350 Nonrheumatic aortic (valve) stenosis: Secondary | ICD-10-CM | POA: Insufficient documentation

## 2023-09-02 DIAGNOSIS — I6521 Occlusion and stenosis of right carotid artery: Secondary | ICD-10-CM | POA: Diagnosis not present
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
## 2023-09-02 NOTE — Patient Instructions (Signed)
Medication Instructions:  No changes *If you need a refill on your cardiac medications before your next appointment, please call your pharmacy*   Lab Work: None ordered If you have labs (blood work) drawn today and your tests are completely normal, you will receive your results only by: MyChart Message (if you have MyChart) OR A paper copy in the mail If you have any lab test that is abnormal or we need to change your treatment, we will call you to review the results.   Testing/Procedures: Your physician has requested that you have a carotid duplex. This test is an ultrasound of the carotid arteries in your neck. It looks at blood flow through these arteries that supply the brain with blood. Allow one hour for this exam. There are no restrictions or special instructions. This will take place at [address], [address].  Your physician has requested that you have a renal artery duplex. During this test, an ultrasound is used to evaluate blood flow to the kidneys. Take your medications as you usually do. This will take place at [address], [address].  No food after 11PM the night before.  Water is OK. (Don't drink liquids if you have been instructed not to for ANOTHER test). Avoid foods that produce bowel gas, for 24 hours prior to exam (see below). No breakfast, no chewing gum, no smoking or carbonated beverages. Patient may take morning medications with water. Come in for test at least 15 minutes early to register.    Follow-Up: At [facility], you and your health needs are our priority.  As part of our continuing mission to provide you with exceptional heart care, we have created designated Provider Care Teams.  These Care Teams include your primary Cardiologist (physician) and Advanced Practice Providers (APPs -  Physician Assistants and Nurse Practitioners) who all work together to provide you with the care you need, when you need it.  We recommend signing up  for the patient portal called "MyChart".  Sign up information is provided on this After Visit Summary.  MyChart is used to connect with patients for Virtual Visits (Telemedicine).  Patients are able to view lab/test results, encounter notes, upcoming appointments, etc.  Non-urgent messages can be sent to your provider as well.   To learn more about what you can do with MyChart, go to [email or web address].    Your next appointment:   6 month(s)  Provider:   Lorine Bears, MD

## 2023-09-02 NOTE — Telephone Encounter (Signed)
Refill Request.  

## 2023-09-04 DIAGNOSIS — M48062 Spinal stenosis, lumbar region with neurogenic claudication: Secondary | ICD-10-CM | POA: Diagnosis not present

## 2023-09-04 DIAGNOSIS — M7062 Trochanteric bursitis, left hip: Secondary | ICD-10-CM | POA: Diagnosis not present

## 2023-09-10 DIAGNOSIS — M160 Bilateral primary osteoarthritis of hip: Secondary | ICD-10-CM | POA: Diagnosis not present

## 2023-09-10 DIAGNOSIS — M7062 Trochanteric bursitis, left hip: Secondary | ICD-10-CM | POA: Diagnosis not present

## 2023-09-10 DIAGNOSIS — S73192A Other sprain of left hip, initial encounter: Secondary | ICD-10-CM | POA: Diagnosis not present

## 2023-09-18 ENCOUNTER — Ambulatory Visit (HOSPITAL_COMMUNITY)
Admission: RE | Admit: 2023-09-18 | Discharge: 2023-09-18 | Disposition: A | Discharge status: Home / Self Care | Payer: Medicare Other | Source: Ambulatory Visit | Attending: Cardiology | Admitting: Cardiology

## 2023-09-18 ENCOUNTER — Ambulatory Visit (HOSPITAL_BASED_OUTPATIENT_CLINIC_OR_DEPARTMENT_OTHER)
Admission: RE | Admit: 2023-09-18 | Discharge: 2023-09-18 | Disposition: A | Discharge status: Home / Self Care | Payer: Medicare Other | Source: Ambulatory Visit | Attending: Cardiovascular Disease | Admitting: Cardiovascular Disease

## 2023-09-18 DIAGNOSIS — I6521 Occlusion and stenosis of right carotid artery: Secondary | ICD-10-CM | POA: Insufficient documentation

## 2023-09-18 DIAGNOSIS — I701 Atherosclerosis of renal artery: Secondary | ICD-10-CM | POA: Insufficient documentation

## 2023-09-25 ENCOUNTER — Other Ambulatory Visit (HOSPITAL_COMMUNITY): Payer: Self-pay | Admitting: *Deleted

## 2023-09-25 DIAGNOSIS — I6523 Occlusion and stenosis of bilateral carotid arteries: Secondary | ICD-10-CM

## 2023-10-02 DIAGNOSIS — M7061 Trochanteric bursitis, right hip: Secondary | ICD-10-CM | POA: Diagnosis not present

## 2023-10-02 DIAGNOSIS — M25551 Pain in right hip: Secondary | ICD-10-CM | POA: Diagnosis not present

## 2023-10-02 DIAGNOSIS — M5416 Radiculopathy, lumbar region: Secondary | ICD-10-CM | POA: Diagnosis not present

## 2023-10-02 DIAGNOSIS — M48062 Spinal stenosis, lumbar region with neurogenic claudication: Secondary | ICD-10-CM | POA: Diagnosis not present

## 2023-10-02 DIAGNOSIS — M7062 Trochanteric bursitis, left hip: Secondary | ICD-10-CM | POA: Diagnosis not present

## 2023-10-17 ENCOUNTER — Encounter: Payer: Self-pay | Admitting: Internal Medicine

## 2023-10-17 ENCOUNTER — Ambulatory Visit (INDEPENDENT_AMBULATORY_CARE_PROVIDER_SITE_OTHER): Payer: Medicare Other | Admitting: Internal Medicine

## 2023-10-17 VITALS — BP 146/92 | HR 62 | Temp 97.5°F | Resp 16 | Ht 67.0 in | Wt 168.5 lb

## 2023-10-17 DIAGNOSIS — I10 Essential (primary) hypertension: Secondary | ICD-10-CM

## 2023-10-17 DIAGNOSIS — E7800 Pure hypercholesterolemia, unspecified: Secondary | ICD-10-CM | POA: Diagnosis not present

## 2023-10-17 DIAGNOSIS — E119 Type 2 diabetes mellitus without complications: Secondary | ICD-10-CM

## 2023-10-17 DIAGNOSIS — Z23 Encounter for immunization: Secondary | ICD-10-CM

## 2023-10-17 DIAGNOSIS — R600 Localized edema: Secondary | ICD-10-CM

## 2023-10-17 DIAGNOSIS — R7303 Prediabetes: Secondary | ICD-10-CM

## 2023-10-17 LAB — BASIC METABOLIC PANEL
BUN: 14 mg/dL (ref 6–23)
CO2: 33 meq/L — ABNORMAL HIGH (ref 19–32)
Calcium: 10.5 mg/dL (ref 8.4–10.5)
Chloride: 97 meq/L (ref 96–112)
Creatinine, Ser: 1.08 mg/dL (ref 0.40–1.50)
GFR: 63.95 mL/min (ref >60.00)
Glucose, Bld: 92 mg/dL (ref 70–99)
Potassium: 4.1 meq/L (ref 3.5–5.1)
Sodium: 139 meq/L (ref 135–145)

## 2023-10-17 LAB — AST: AST: 26 U/L (ref 0–37)

## 2023-10-17 LAB — LIPID PANEL
Cholesterol: 122 mg/dL (ref 0–200)
HDL: 45.8 mg/dL (ref >39.00)
LDL Cholesterol: 56 mg/dL (ref 0–99)
NonHDL: 75.98
Total CHOL/HDL Ratio: 3
Triglycerides: 102 mg/dL (ref 0.0–149.0)
VLDL: 20.4 mg/dL (ref 0.0–40.0)

## 2023-10-17 LAB — MICROALBUMIN / CREATININE URINE RATIO
Creatinine,U: 53.9 mg/dL
Microalb Creat Ratio: 1.3 mg/g (ref 0.0–30.0)
Microalb, Ur: <0.7 mg/dL (ref 0.0–1.9)

## 2023-10-17 LAB — HEMOGLOBIN A1C: Hgb A1c MFr Bld: 6.1 % (ref 4.6–6.5)

## 2023-10-17 LAB — ALT: ALT: 28 U/L (ref 0–53)

## 2023-10-17 MED ORDER — HYDROCHLOROTHIAZIDE 25 MG PO TABS: ORAL_TABLET | ORAL | 1 refills | Status: DC

## 2023-10-17 NOTE — Patient Instructions (Addendum)
Hydrochlorothiazide: Alternate ONE tablet a day with  TWO tablets a day  vaccines I recommend: Covid booster RSV vaccine   Continue checking your blood pressure regularly Blood pressure goal:  between 110/65 and  135/85. If it is consistently higher or lower, let me know     GO TO THE LAB : Get the blood work     Next visit with me in 4 to 5 months. Please schedule it at the front desk
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
## 2023-10-17 NOTE — Assessment & Plan Note (Signed)
Prediabetes: Check A1c HTN: Ambulatory BP ranged from 106/59-130/64. For the last week, by mistake, has taken two HCTZs.  He also takes Micardis and carvedilol. Chronic L leg edema much improved. Plan: Decrease HCTZ: Alternate 1 every other day and 2 QOD.  Check BMP. L lower extremity edema: See above.  Hopefully, a extra HCTZ every other day will keep the swelling under better control.   High cholesterol: On atorvastatin, due for labs. MSK: Reportedly had a MRI, Dx with left hip labral tear, also having chronic back pain, had a local injection.  Managed elsewhere. Vaccine advised: Flu shot today, recommend a COVID-vaccine. RTC 4 to 5 months

## 2023-10-20 ENCOUNTER — Encounter: Payer: Self-pay | Admitting: Orthopaedic Surgery

## 2023-10-20 ENCOUNTER — Other Ambulatory Visit (INDEPENDENT_AMBULATORY_CARE_PROVIDER_SITE_OTHER): Payer: Medicare Other

## 2023-10-20 ENCOUNTER — Ambulatory Visit (INDEPENDENT_AMBULATORY_CARE_PROVIDER_SITE_OTHER): Payer: Medicare Other | Admitting: Orthopaedic Surgery

## 2023-10-20 DIAGNOSIS — M25552 Pain in left hip: Secondary | ICD-10-CM

## 2023-10-20 MED ORDER — LIDOCAINE HCL 1 % IJ SOLN
3.0000 mL | INTRAMUSCULAR | Status: AC | PRN
  Administered 2023-10-20: 3 mL

## 2023-10-20 MED ORDER — METHYLPREDNISOLONE ACETATE 40 MG/ML IJ SUSP
40.0000 mg | INTRAMUSCULAR | Status: AC | PRN
  Administered 2023-10-20: 40 mg via INTRA_ARTICULAR

## 2023-10-20 NOTE — Progress Notes (Signed)
The patient is well-known to me.  He is 82 years old and we have treated him before for left hip trochanteric bursitis.  He has had significant issues as a relates to his spine and he is a regular patient of Dr. Donalee Citrin with neurosurgery.  He has had several lumbar spine surgeries.  He did have an MRI recently of his pelvis and left hip and it did show mild arthritis of the left hip with a small superior labral tear.  It did also show inflammation along the greater trochanteric area consistent with trochanteric bursitis.  His pain is not in the groin on the left side and its only hurts on the lateral aspect of his hip  On exam today I can put his left hip easily through internal and external rotation with no pain in the groin at all and no blocks to rotation.  However he has significant pain to palpation along the tip and proximal trochanteric area on the lateral side of his left hip.  I did review the MRI with him and showed him the imaging studies.  Also obtain new plain films today that showed a normal-appearing hips bilaterally.  I do feel that this is a severe inflammation that is associated with trochanteric bursitis of her left hip.  I did recommend a steroid injection which agreed to and tolerated well over the trochanteric area of his left hip.  We certainly could consider a bursectomy in this area if he continues to have significant problems right now he does not need a hip replacement from my standpoint.  He agrees with this as well.  He tolerated the steroid injection today and I will see him back in 2 weeks to see how he is doing overall.  All questions and concerns were answered and addressed.     Procedure Note  Patient: [name]             Date of Birth: 1941-05-20           MRN: [id number]             Visit Date: 10/20/2023  Procedures: Visit Diagnoses:  1. Pain of left hip     Large Joint Inj: L greater trochanter on 10/20/2023 2:01 PM Indications: pain and diagnostic  evaluation Details: 22 G 1.5 in needle, lateral approach  Arthrogram: No  Medications: 3 mL lidocaine 1 %; 40 mg methylPREDNISolone acetate 40 MG/ML Outcome: tolerated well, no immediate complications Procedure, treatment alternatives, risks and benefits explained, specific risks discussed. Consent was given by the patient. Immediately prior to procedure a time out was called to verify the correct patient, procedure, equipment, support staff and site/side marked as required. Patient was prepped and draped in the usual sterile fashion.

## 2023-10-22 ENCOUNTER — Ambulatory Visit: Payer: Medicare Other | Admitting: Orthopaedic Surgery

## 2023-11-10 ENCOUNTER — Encounter: Payer: Self-pay | Admitting: Orthopaedic Surgery

## 2023-11-10 ENCOUNTER — Ambulatory Visit (INDEPENDENT_AMBULATORY_CARE_PROVIDER_SITE_OTHER): Payer: Medicare Other | Admitting: Orthopaedic Surgery

## 2023-11-10 DIAGNOSIS — M7062 Trochanteric bursitis, left hip: Secondary | ICD-10-CM

## 2023-11-10 DIAGNOSIS — M25552 Pain in left hip: Secondary | ICD-10-CM | POA: Diagnosis not present

## 2023-11-10 NOTE — Progress Notes (Signed)
The patient is an 82 year old gentleman with chronic trochanteric bursitis of his left hip.  We did place a steroid injection about hip about 2 weeks ago and has had several days it was very helpful but he has the same pain and back again in that area.  It does not hurt in the groin.  It only hurts after he has done working and standing all day.  It does not wake him up at night.  He does have a lumbar spine fusion.  He is let me know that he does have an epidural steroid injection in his lumbar spine coming up soon.  He has not injured any type of surgical intervention.  I did review his previous MRI again today and plain films of his pelvis and left hip.  It still does not look like this is a hip that needs replacing but potentially could benefit from a bursectomy over the left hip.  Exam his left hip moves smoothly and fluidly.  There is some pain over the trochanteric area but is not severe today.  We both agreed that he should come back in about 2 months for repeat steroid injection over left hip trochanteric area.  That way we can see how the epidural does for him as well.  We will see him in 2 months for that repeat injection of his left hip trochanteric area.

## 2023-11-17 DIAGNOSIS — M5416 Radiculopathy, lumbar region: Secondary | ICD-10-CM | POA: Diagnosis not present

## 2023-11-20 ENCOUNTER — Other Ambulatory Visit: Payer: Self-pay | Admitting: Internal Medicine

## 2023-11-24 DIAGNOSIS — M0609 Rheumatoid arthritis without rheumatoid factor, multiple sites: Secondary | ICD-10-CM | POA: Diagnosis not present

## 2023-11-24 DIAGNOSIS — Z6826 Body mass index (BMI) 26.0-26.9, adult: Secondary | ICD-10-CM | POA: Diagnosis not present

## 2023-11-24 DIAGNOSIS — M5136 Other intervertebral disc degeneration, lumbar region with discogenic back pain only: Secondary | ICD-10-CM | POA: Diagnosis not present

## 2023-11-24 DIAGNOSIS — M5030 Other cervical disc degeneration, unspecified cervical region: Secondary | ICD-10-CM | POA: Diagnosis not present

## 2023-11-24 DIAGNOSIS — E663 Overweight: Secondary | ICD-10-CM | POA: Diagnosis not present

## 2023-11-24 DIAGNOSIS — Z79899 Other long term (current) drug therapy: Secondary | ICD-10-CM | POA: Diagnosis not present

## 2023-12-10 ENCOUNTER — Other Ambulatory Visit: Payer: Self-pay

## 2023-12-10 MED ORDER — HYDROCHLOROTHIAZIDE 25 MG PO TABS: ORAL_TABLET | ORAL | 1 refills | Status: DC

## 2024-01-01 DIAGNOSIS — M48062 Spinal stenosis, lumbar region with neurogenic claudication: Secondary | ICD-10-CM | POA: Diagnosis not present

## 2024-01-01 DIAGNOSIS — G959 Disease of spinal cord, unspecified: Secondary | ICD-10-CM | POA: Diagnosis not present

## 2024-01-01 DIAGNOSIS — M4316 Spondylolisthesis, lumbar region: Secondary | ICD-10-CM | POA: Diagnosis not present

## 2024-01-01 DIAGNOSIS — M5440 Lumbago with sciatica, unspecified side: Secondary | ICD-10-CM | POA: Diagnosis not present

## 2024-01-05 ENCOUNTER — Ambulatory Visit: Payer: Medicare Other | Admitting: Orthopaedic Surgery

## 2024-01-07 DIAGNOSIS — M48062 Spinal stenosis, lumbar region with neurogenic claudication: Secondary | ICD-10-CM | POA: Diagnosis not present

## 2024-01-09 ENCOUNTER — Other Ambulatory Visit: Payer: Self-pay | Admitting: Cardiovascular Disease

## 2024-01-13 DIAGNOSIS — M48062 Spinal stenosis, lumbar region with neurogenic claudication: Secondary | ICD-10-CM | POA: Diagnosis not present

## 2024-01-14 ENCOUNTER — Ambulatory Visit (INDEPENDENT_AMBULATORY_CARE_PROVIDER_SITE_OTHER): Payer: Medicare Other | Admitting: Orthopaedic Surgery

## 2024-01-14 DIAGNOSIS — M25552 Pain in left hip: Secondary | ICD-10-CM | POA: Diagnosis not present

## 2024-01-14 DIAGNOSIS — M7062 Trochanteric bursitis, left hip: Secondary | ICD-10-CM | POA: Diagnosis not present

## 2024-01-14 NOTE — Progress Notes (Signed)
The patient is well-known to me.  He is 83 years old and has chronic left hip trochanteric bursitis and tendinosis.  He hurts always over the same area of his left hip and it has become debilitating.  He is also followed by Dr. Donalee Citrin from a standpoint of neurosurgery.  He had lumbar spine surgery.  He is currently in physical therapy at [facility].  He said yesterday they did try some heat over this area and he got some of the better sleep that has gotten a while but he still has chronic and daily pain with his left hip.  He was originally coming in today to consider a steroid injection over his left hip but he has decided he does not want that type of injection.  He wants to talk about potential surgical options.  Again plain films of his pelvis and left hip have been unremarkable in terms of the need for any type of hip replacement surgery.  His left and right hips move smoothly and fluidly.  He is incredibly point tender to palpation over the tip of the trochanteric area of his left hip.  At this point I would like to send him to my partner Dr. Steward Drone for his assessment of the patient's left hip and determine whether or not he would benefit from some type of surgical intervention in this area involving the tendons or even a bursectomy.  The patient is also requesting this referral.

## 2024-01-15 DIAGNOSIS — M48062 Spinal stenosis, lumbar region with neurogenic claudication: Secondary | ICD-10-CM | POA: Diagnosis not present

## 2024-01-20 DIAGNOSIS — M48062 Spinal stenosis, lumbar region with neurogenic claudication: Secondary | ICD-10-CM | POA: Diagnosis not present

## 2024-01-22 DIAGNOSIS — M48062 Spinal stenosis, lumbar region with neurogenic claudication: Secondary | ICD-10-CM | POA: Diagnosis not present

## 2024-01-23 ENCOUNTER — Ambulatory Visit (HOSPITAL_BASED_OUTPATIENT_CLINIC_OR_DEPARTMENT_OTHER): Payer: Medicare Other | Admitting: Orthopaedic Surgery

## 2024-01-23 DIAGNOSIS — M25552 Pain in left hip: Secondary | ICD-10-CM | POA: Diagnosis not present

## 2024-01-23 DIAGNOSIS — M25551 Pain in right hip: Secondary | ICD-10-CM
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
## 2024-01-29 ENCOUNTER — Other Ambulatory Visit: Payer: Self-pay

## 2024-02-10 ENCOUNTER — Ambulatory Visit: Payer: Medicare Other | Attending: Cardiovascular Disease | Admitting: Cardiovascular Disease

## 2024-02-10 ENCOUNTER — Encounter: Payer: Self-pay | Admitting: Cardiovascular Disease

## 2024-02-10 VITALS — BP 204/69 | HR 64 | Ht 66.0 in | Wt 168.8 lb

## 2024-02-10 DIAGNOSIS — I872 Venous insufficiency (chronic) (peripheral): Secondary | ICD-10-CM | POA: Insufficient documentation

## 2024-02-10 DIAGNOSIS — I701 Atherosclerosis of renal artery: Secondary | ICD-10-CM | POA: Diagnosis not present

## 2024-02-10 DIAGNOSIS — I350 Nonrheumatic aortic (valve) stenosis: Secondary | ICD-10-CM | POA: Insufficient documentation

## 2024-02-10 DIAGNOSIS — E785 Hyperlipidemia, unspecified: Secondary | ICD-10-CM | POA: Diagnosis not present

## 2024-02-10 DIAGNOSIS — I10 Essential (primary) hypertension: Secondary | ICD-10-CM | POA: Diagnosis not present

## 2024-02-10 DIAGNOSIS — I6523 Occlusion and stenosis of bilateral carotid arteries: Secondary | ICD-10-CM | POA: Diagnosis not present

## 2024-02-10 NOTE — Patient Instructions (Signed)
 Medication Instructions:  No changes *If you need a refill on your cardiac medications before your next appointment, please call your pharmacy*   Lab Work: None ordered If you have labs (blood work) drawn today and your tests are completely normal, you will receive your results only by: MyChart Message (if you have MyChart) OR A paper copy in the mail If you have any lab test that is abnormal or we need to change your treatment, we will call you to review the results.   Testing/Procedures: None ordered   Follow-Up: At [facility], you and your health needs are our priority.  As part of our continuing mission to provide you with exceptional heart care, we have created designated Provider Care Teams.  These Care Teams include your primary Cardiologist (physician) and Advanced Practice Providers (APPs -  Physician Assistants and Nurse Practitioners) who all work together to provide you with the care you need, when you need it.  We recommend signing up for the patient portal called "MyChart".  Sign up information is provided on this After Visit Summary.  MyChart is used to connect with patients for Virtual Visits (Telemedicine).  Patients are able to view lab/test results, encounter notes, upcoming appointments, etc.  Non-urgent messages can be sent to your provider as well.   To learn more about what you can do with MyChart, go to [email or web address].    Your next appointment:   6 month(s)  Provider:   Lorine Bears, MD
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
## 2024-02-11 ENCOUNTER — Telehealth: Payer: Self-pay | Admitting: Neurology

## 2024-02-11 NOTE — Telephone Encounter (Signed)
FYI   Copied from CRM [id number]. Topic: General - Other >> Feb 11, 2024  3:10 PM Adele Barthel wrote: Reason for CRM: Patient called to cancel his 02/25 appt with Dr. Drue Novel, due to recent orthopedic issues. He reported that he is having a bilateral MRI on his hips tomorrow and depending on results will most likely require surgery. He does not have any definitive dates for surgery but wanted to update provider on the situation and to let provider know why he canceled. He did not reschedule his appointment, and is waiting for more answers about surgery before he schedules.

## 2024-02-12 ENCOUNTER — Telehealth (HOSPITAL_BASED_OUTPATIENT_CLINIC_OR_DEPARTMENT_OTHER): Payer: Self-pay | Admitting: Orthopaedic Surgery

## 2024-02-12 ENCOUNTER — Other Ambulatory Visit: Payer: Medicare Other

## 2024-02-12 NOTE — Telephone Encounter (Signed)
Patient wants [name] MRI script sent to [facility] imaging because [facility] imaging cx [name] appointment and he feels he can get in quicker to [facility] imaging

## 2024-02-13 NOTE — Telephone Encounter (Signed)
Left vm with pt letting him know I have changed the location for MRI and that someone there will be contacting him to schedule.

## 2024-02-15 ENCOUNTER — Ambulatory Visit (HOSPITAL_BASED_OUTPATIENT_CLINIC_OR_DEPARTMENT_OTHER)
Admission: RE | Admit: 2024-02-15 | Discharge: 2024-02-15 | Disposition: A | Discharge status: Home / Self Care | Payer: Medicare Other | Source: Ambulatory Visit | Attending: Orthopaedic Surgery | Admitting: Orthopaedic Surgery

## 2024-02-15 DIAGNOSIS — M25552 Pain in left hip: Secondary | ICD-10-CM | POA: Insufficient documentation

## 2024-02-15 DIAGNOSIS — S73191A Other sprain of right hip, initial encounter: Secondary | ICD-10-CM | POA: Diagnosis not present

## 2024-02-15 DIAGNOSIS — M160 Bilateral primary osteoarthritis of hip: Secondary | ICD-10-CM | POA: Diagnosis not present

## 2024-02-15 DIAGNOSIS — M25551 Pain in right hip: Secondary | ICD-10-CM

## 2024-02-15 DIAGNOSIS — S73192A Other sprain of left hip, initial encounter: Secondary | ICD-10-CM | POA: Diagnosis not present

## 2024-02-17 ENCOUNTER — Ambulatory Visit: Payer: Medicare Other | Admitting: Internal Medicine

## 2024-02-20 ENCOUNTER — Ambulatory Visit (HOSPITAL_BASED_OUTPATIENT_CLINIC_OR_DEPARTMENT_OTHER): Payer: Self-pay | Admitting: Orthopaedic Surgery

## 2024-02-20 ENCOUNTER — Ambulatory Visit (HOSPITAL_BASED_OUTPATIENT_CLINIC_OR_DEPARTMENT_OTHER): Payer: Medicare Other | Admitting: Orthopaedic Surgery

## 2024-02-20 DIAGNOSIS — M7062 Trochanteric bursitis, left hip: Secondary | ICD-10-CM
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
## 2024-02-23 ENCOUNTER — Telehealth: Payer: Self-pay

## 2024-02-23 DIAGNOSIS — M0609 Rheumatoid arthritis without rheumatoid factor, multiple sites: Secondary | ICD-10-CM | POA: Diagnosis not present

## 2024-02-23 NOTE — Telephone Encounter (Signed)
 Completed form faxed to ATTN: April at [facility] at [phone]. Form sent for scanning.

## 2024-02-23 NOTE — Telephone Encounter (Signed)
 Received surgery clearance form from [facility], Pt is needing a L gluteus maximus tendon transfer w/ collagen patch augmentation w/ Dr. Steward Drone. Pt was last seen on 10/17/23, an EKG was done at his OV w/ cardiology on 02/10/24, please advise if appt is needed.

## 2024-02-23 NOTE — Telephone Encounter (Signed)
 Received fax confirmation

## 2024-02-23 NOTE — Telephone Encounter (Signed)
 Needs clearance for orthopedic procedure. Reach out to patient, if his blood pressure is well-controlled and he feels well he is clear from my side. Also needs clearance from cardiology.

## 2024-02-24 ENCOUNTER — Encounter (HOSPITAL_BASED_OUTPATIENT_CLINIC_OR_DEPARTMENT_OTHER): Payer: Self-pay | Admitting: Orthopaedic Surgery

## 2024-02-24 ENCOUNTER — Telehealth: Payer: Self-pay | Admitting: Cardiovascular Disease

## 2024-02-24 NOTE — Telephone Encounter (Signed)
   Pre-operative Risk Assessment    Patient Name: [name]  DOB: 1941/05/01 MRN: [id number]   Date of last office visit: 02/10/24 Date of next office visit: n/a   Request for Surgical Clearance    Procedure:  Left Gluteus Maximus Tendon transfer, Collagen Patch Augmentation  Date of Surgery:  Clearance TBD                                Surgeon:  [name], MD Surgeon's Group or Practice Name:  [facility] Phone number:  [phone] Fax number:  [phone]   Type of Clearance Requested:   - Medical    Type of Anesthesia:  General    Additional requests/questions:    Signed, Narda Amber   02/24/2024, 8:55 AM

## 2024-02-25 ENCOUNTER — Encounter: Payer: Self-pay | Admitting: Internal Medicine

## 2024-02-25 ENCOUNTER — Other Ambulatory Visit (HOSPITAL_BASED_OUTPATIENT_CLINIC_OR_DEPARTMENT_OTHER): Payer: Medicare Other

## 2024-02-25 NOTE — Telephone Encounter (Signed)
He is at low risk from a cardiac standpoint.

## 2024-02-25 NOTE — Telephone Encounter (Signed)
   Patient Name: [name]  DOB: 05-25-1941 MRN: [id number]  Primary Cardiologist: Lorine Bears, MD  Chart reviewed as part of pre-operative protocol coverage. Given past medical history and time since last visit, based on ACC/AHA guidelines, [place] is at acceptable risk for the planned procedure without further cardiovascular testing.   The patient was advised that if he develops new symptoms prior to surgery to contact our office to arrange for a follow-up visit, and he verbalized understanding.  I will route this recommendation to the requesting party via Epic fax function and remove from pre-op pool.  Please call with questions.  Napoleon Form, Leodis Rains, NP 02/25/2024, 2:21 PM

## 2024-03-02 ENCOUNTER — Other Ambulatory Visit: Payer: Self-pay | Admitting: Orthopaedic Surgery

## 2024-03-02 DIAGNOSIS — M7062 Trochanteric bursitis, left hip: Secondary | ICD-10-CM

## 2024-03-23 ENCOUNTER — Other Ambulatory Visit: Payer: Self-pay

## 2024-03-23 ENCOUNTER — Encounter (HOSPITAL_BASED_OUTPATIENT_CLINIC_OR_DEPARTMENT_OTHER): Payer: Self-pay | Admitting: Orthopaedic Surgery

## 2024-03-23 NOTE — Progress Notes (Signed)
   03/23/24 1134  Pre-op Phone Call  Surgery Date Verified 03/30/24  Arrival Time Verified 0815  Surgery Location Verified [facility] [place]  Medical History Reviewed Yes  Is the patient taking a GLP-1 receptor agonist? No  Does the patient have diabetes? No diagnosis of diabetes  Do you have a history of heart problems? Yes  Cardiologist Name Lorine Bears  Have you ever had tests on your heart? Yes  What cardiac tests were performed? Echo;EKG  Results viewable: [facility] Media Tab  Does patient have other implanted devices? No  Patient Teaching Enhanced Recovery;Pre / Post Procedure  Patient educated about smoking cessation 24 hours prior to surgery. N/A Non-Smoker  Patient verbalizes understanding of bowel prep? N/A  THA/TKA patients only:  By your surgery date, will you have been taking narcotics for 90 days or greater? No  Med Rec Completed Yes  Take the Following Meds the Morning of Surgery lipitor, claritin, aetia, coreg  Recent  Lab Work, EKG, CXR? No  NPO (Including gum & candy) After midnight  Allowed clear liquids Water;Black Coffee Only (no creamer, milk or cream including half and half);Gatorade  (diabetics please choose diet or no sugar options)  Patient instructed to stop clear liquids including Carb loading drink at: 0630  Stop Solids, Milk, Candy, and Gum STARTING AT MIDNIGHT  Did patient view EMMI videos? No  Responsible adult to drive and be with you for 24 hours? Yes  Name & Phone Number for Ride/Caregiver Daughter  No Jewelry, money, nail polish or make-up.  No lotions, powders, perfumes. No shaving  48 hrs. prior to surgery. Yes  Contacts, Dentures & Glasses Will Have to be Removed Before OR. Yes  Please bring your ID and Insurance Card the morning of your surgery. (Surgery Centers Only) Yes  Bring any papers or x-rays with you that your surgeon gave you. Yes  Instructed to contact the location of procedure/ provider if they or anyone in their household develops symptoms or  tests positive for COVID-19, has close contact with someone who tests positive for COVID, or has known exposure to any contagious illness. Yes  Call this number the morning of surgery  with any problems that may cancel your surgery. [phone]  Covid-19 Assessment  Have you had a positive COVID-19 test within the previous 90 days? No  COVID Testing Guidance Proceed with the additional questions.  Patient's surgery required a COVID-19 test (cardiothoracic, complex ENT, and bronchoscopies/ EBUS) No  Have you been unmasked and in close contact with anyone with COVID-19 or COVID-19 symptoms within the past 10 days? No  Do you or anyone in your household currently have any COVID-19 symptoms? No   Discussed with Dr. Renold Don and should be okay for MCDS

## 2024-03-25 ENCOUNTER — Encounter (HOSPITAL_BASED_OUTPATIENT_CLINIC_OR_DEPARTMENT_OTHER)
Admission: RE | Admit: 2024-03-25 | Discharge: 2024-03-25 | Disposition: A | Discharge status: Home / Self Care | Source: Ambulatory Visit | Attending: Orthopaedic Surgery | Admitting: Orthopaedic Surgery

## 2024-03-25 DIAGNOSIS — Z01812 Encounter for preprocedural laboratory examination: Secondary | ICD-10-CM | POA: Insufficient documentation

## 2024-03-25 LAB — BASIC METABOLIC PANEL WITH GFR
Anion gap: 8 (ref 5–15)
BUN: 15 mg/dL (ref 8–23)
CO2: 27 mmol/L (ref 22–32)
Calcium: 9.9 mg/dL (ref 8.9–10.3)
Chloride: 102 mmol/L (ref 98–111)
Creatinine, Ser: 1.12 mg/dL (ref 0.61–1.24)
GFR, Estimated: >60 mL/min (ref >60)
Glucose, Bld: 89 mg/dL (ref 70–99)
Potassium: 4 mmol/L (ref 3.5–5.1)
Sodium: 137 mmol/L (ref 135–145)

## 2024-03-25 NOTE — Progress Notes (Signed)

## 2024-03-28 NOTE — Anesthesia Preprocedure Evaluation (Signed)
 Anesthesia Evaluation  Patient identified by MRN, date of birth, ID band Patient awake    Reviewed: Allergy & Precautions, NPO status , Patient's Chart, lab work & pertinent test results  History of Anesthesia Complications Negative for: history of anesthetic complications  Airway Mallampati: III  TM Distance: >3 FB Neck ROM: Limited   Comment: Previous grade I view with Miller 2, easy mask Dental  (+) Dental Advisory Given   Pulmonary neg shortness of breath, sleep apnea (does not use CPAP) , neg COPD, neg recent URI, former smoker   Pulmonary exam normal breath sounds clear to auscultation       Cardiovascular hypertension (carvedilol, HCTZ, telmisartan), Pt. on home beta blockers (-) angina + Past MI (2002, patient denies)  (-) Cardiac Stents and (-) CABG (-) dysrhythmias + Valvular Problems/Murmurs (mild)  Rhythm:Regular Rate:Normal + Systolic murmurs HLD, bilateral carotid disease  TTE 04/23/2023: IMPRESSIONS    1. Left ventricular ejection fraction, by estimation, is 70 to 75%. The  left ventricle has hyperdynamic function. The left ventricle has no  regional wall motion abnormalities. There is mild left ventricular  hypertrophy. Left ventricular diastolic  parameters are consistent with Grade I diastolic dysfunction (impaired  relaxation).   2. Right ventricular systolic function is normal. The right ventricular  size is normal. Tricuspid regurgitation signal is inadequate for assessing  PA pressure.   3. The mitral valve is degenerative. No evidence of mitral valve  regurgitation. No evidence of mitral stenosis. The mean mitral valve  gradient is 2.5 mmHg with average heart rate of 71 bpm.   4. The aortic valve is abnormal. There is moderate calcification of the  aortic valve. Aortic valve regurgitation is not visualized. Mild aortic  valve stenosis. Aortic valve area, by VTI measures 2.02 cm. Aortic valve  mean  gradient measures 14.0 mmHg.  Aortic valve Vmax measures 2.56 m/s.   5. The inferior vena cava is normal in size with greater than 50%  respiratory variability, suggesting right atrial pressure of 3 mmHg.     Neuro/Psych  Headaches, neg Seizures PSYCHIATRIC DISORDERS (PTSD, ADD) Anxiety Depression   Dementia Sleep paralysis in 2002 TIA (x2) Neuromuscular disease (cervical spondylosis s/p fusion, s/p lumbar fusion)    GI/Hepatic Neg liver ROS, hiatal hernia,GERD (s/p Nissen fundoplication)  ,,Diverticulosis    Endo/Other  negative endocrine ROS    Renal/GU CRFRenal disease (right renal artery stenosis)     Musculoskeletal  (+) Arthritis , Rheumatoid disorders,  Osteoporosis, scoliosis    Abdominal   Peds  Hematology negative hematology ROS (+) Lab Results      Component                Value               Date                      WBC                      12.5 (H)            02/11/2023                HGB                      14.7                02/11/2023  HCT                      42.0                02/11/2023                MCV                      93.3                02/11/2023                PLT                      281                 02/11/2023              Anesthesia Other Findings   Reproductive/Obstetrics                             Anesthesia Physical Anesthesia Plan  ASA: 3  Anesthesia Plan: General   Post-op Pain Management:    Induction: Intravenous  PONV Risk Score and Plan: 2 and Ondansetron, Dexamethasone and Treatment may vary due to age or medical condition  Airway Management Planned: Oral ETT  Additional Equipment:   Intra-op Plan:   Post-operative Plan: Extubation in OR  Informed Consent: I have reviewed the patients History and Physical, chart, labs and discussed the procedure including the risks, benefits and alternatives for the proposed anesthesia with the patient or authorized representative who  has indicated his/her understanding and acceptance.     Dental advisory given  Plan Discussed with: CRNA and Anesthesiologist  Anesthesia Plan Comments: (Risks of general anesthesia discussed including, but not limited to, sore throat, hoarse voice, chipped/damaged teeth, injury to vocal cords, nausea and vomiting, allergic reactions, lung infection, heart attack, stroke, and death. All questions answered. )        Anesthesia Quick Evaluation

## 2024-03-30 ENCOUNTER — Ambulatory Visit (HOSPITAL_BASED_OUTPATIENT_CLINIC_OR_DEPARTMENT_OTHER)
Admission: RE | Admit: 2024-03-30 | Discharge: 2024-03-30 | Disposition: A | Discharge status: Home / Self Care | Attending: Orthopaedic Surgery | Admitting: Orthopaedic Surgery

## 2024-03-30 ENCOUNTER — Encounter (HOSPITAL_BASED_OUTPATIENT_CLINIC_OR_DEPARTMENT_OTHER)
Admission: RE | Disposition: A | Discharge status: Home / Self Care | Payer: Self-pay | Source: Home / Self Care | Attending: Orthopaedic Surgery

## 2024-03-30 ENCOUNTER — Other Ambulatory Visit: Payer: Self-pay

## 2024-03-30 ENCOUNTER — Ambulatory Visit (HOSPITAL_BASED_OUTPATIENT_CLINIC_OR_DEPARTMENT_OTHER): Payer: Self-pay | Admitting: Anesthesiology

## 2024-03-30 ENCOUNTER — Encounter (HOSPITAL_BASED_OUTPATIENT_CLINIC_OR_DEPARTMENT_OTHER): Payer: Self-pay | Admitting: Orthopaedic Surgery

## 2024-03-30 DIAGNOSIS — K219 Gastro-esophageal reflux disease without esophagitis: Secondary | ICD-10-CM | POA: Insufficient documentation

## 2024-03-30 DIAGNOSIS — G4730 Sleep apnea, unspecified: Secondary | ICD-10-CM | POA: Insufficient documentation

## 2024-03-30 DIAGNOSIS — M1990 Unspecified osteoarthritis, unspecified site: Secondary | ICD-10-CM | POA: Diagnosis not present

## 2024-03-30 DIAGNOSIS — G309 Alzheimer's disease, unspecified: Secondary | ICD-10-CM | POA: Diagnosis not present

## 2024-03-30 DIAGNOSIS — I252 Old myocardial infarction: Secondary | ICD-10-CM

## 2024-03-30 DIAGNOSIS — Z87891 Personal history of nicotine dependence: Secondary | ICD-10-CM

## 2024-03-30 DIAGNOSIS — S76012A Strain of muscle, fascia and tendon of left hip, initial encounter: Secondary | ICD-10-CM

## 2024-03-30 DIAGNOSIS — Z01818 Encounter for other preprocedural examination: Secondary | ICD-10-CM

## 2024-03-30 DIAGNOSIS — I129 Hypertensive chronic kidney disease with stage 1 through stage 4 chronic kidney disease, or unspecified chronic kidney disease: Secondary | ICD-10-CM | POA: Insufficient documentation

## 2024-03-30 DIAGNOSIS — F0280 Dementia in other diseases classified elsewhere without behavioral disturbance: Secondary | ICD-10-CM | POA: Diagnosis not present

## 2024-03-30 DIAGNOSIS — N189 Chronic kidney disease, unspecified: Secondary | ICD-10-CM | POA: Diagnosis not present

## 2024-03-30 DIAGNOSIS — G2581 Restless legs syndrome: Secondary | ICD-10-CM | POA: Diagnosis not present

## 2024-03-30 DIAGNOSIS — S76312D Strain of muscle, fascia and tendon of the posterior muscle group at thigh level, left thigh, subsequent encounter: Secondary | ICD-10-CM | POA: Diagnosis present

## 2024-03-30 DIAGNOSIS — I10 Essential (primary) hypertension: Secondary | ICD-10-CM

## 2024-03-30 DIAGNOSIS — X58XXXA Exposure to other specified factors, initial encounter: Secondary | ICD-10-CM | POA: Diagnosis not present

## 2024-03-30 DIAGNOSIS — M7062 Trochanteric bursitis, left hip: Secondary | ICD-10-CM

## 2024-03-30 DIAGNOSIS — F418 Other specified anxiety disorders: Secondary | ICD-10-CM | POA: Diagnosis not present

## 2024-03-30 HISTORY — PX: GLUTEUS MINIMUS REPAIR: SHX5843

## 2024-03-30 SURGERY — REPAIR, TENDON, GLUTEUS MINIMUS
Anesthesia: General | Site: Hip | Laterality: Left

## 2024-03-30 MED ORDER — PROPOFOL 10 MG/ML IV BOLUS
INTRAVENOUS | Status: DC | PRN
  Administered 2024-03-30: 100 mg via INTRAVENOUS

## 2024-03-30 MED ORDER — CEFAZOLIN SODIUM-DEXTROSE 2-4 GM/100ML-% IV SOLN
INTRAVENOUS | Status: AC
  Filled 2024-03-30: qty 100

## 2024-03-30 MED ORDER — AMISULPRIDE (ANTIEMETIC) 5 MG/2ML IV SOLN: 10.0000 mg | Freq: Once | INTRAVENOUS | Status: DC | PRN

## 2024-03-30 MED ORDER — ROCURONIUM BROMIDE 100 MG/10ML IV SOLN
INTRAVENOUS | Status: DC | PRN
  Administered 2024-03-30: 60 mg via INTRAVENOUS

## 2024-03-30 MED ORDER — TRAMADOL HCL 50 MG PO TABS
50.0000 mg | ORAL_TABLET | Freq: Once | ORAL | Status: AC
  Administered 2024-03-30: 50 mg via ORAL

## 2024-03-30 MED ORDER — ROCURONIUM BROMIDE 10 MG/ML (PF) SYRINGE
PREFILLED_SYRINGE | INTRAVENOUS | Status: AC
  Filled 2024-03-30: qty 30

## 2024-03-30 MED ORDER — PROPOFOL 500 MG/50ML IV EMUL
INTRAVENOUS | Status: AC
  Filled 2024-03-30: qty 200

## 2024-03-30 MED ORDER — HYDROMORPHONE HCL 1 MG/ML IJ SOLN
INTRAMUSCULAR | Status: AC
  Filled 2024-03-30: qty 0.5

## 2024-03-30 MED ORDER — FENTANYL CITRATE (PF) 100 MCG/2ML IJ SOLN
INTRAMUSCULAR | Status: DC | PRN
  Administered 2024-03-30 (×2): 25 ug via INTRAVENOUS
  Administered 2024-03-30: 50 ug via INTRAVENOUS

## 2024-03-30 MED ORDER — 0.9 % SODIUM CHLORIDE (POUR BTL) OPTIME
TOPICAL | Status: DC | PRN
  Administered 2024-03-30: 500 mL

## 2024-03-30 MED ORDER — HYDROMORPHONE HCL 1 MG/ML IJ SOLN
0.2500 mg | INTRAMUSCULAR | Status: DC | PRN
  Administered 2024-03-30 (×3): 0.5 mg via INTRAVENOUS

## 2024-03-30 MED ORDER — BUPIVACAINE HCL 0.25 % IJ SOLN
INTRAMUSCULAR | Status: DC | PRN
  Administered 2024-03-30: 20 mL

## 2024-03-30 MED ORDER — TRANEXAMIC ACID-NACL 1000-0.7 MG/100ML-% IV SOLN
INTRAVENOUS | Status: AC
Start: 2024-03-30 — End: ?
  Filled 2024-03-30: qty 100

## 2024-03-30 MED ORDER — CEFAZOLIN SODIUM-DEXTROSE 2-4 GM/100ML-% IV SOLN
2.0000 g | INTRAVENOUS | Status: AC
  Administered 2024-03-30: 2 g via INTRAVENOUS

## 2024-03-30 MED ORDER — ACETAMINOPHEN 500 MG PO TABS
ORAL_TABLET | ORAL | Status: AC
  Filled 2024-03-30: qty 2

## 2024-03-30 MED ORDER — TRANEXAMIC ACID-NACL 1000-0.7 MG/100ML-% IV SOLN
1000.0000 mg | INTRAVENOUS | Status: AC
  Administered 2024-03-30: 1000 mg via INTRAVENOUS

## 2024-03-30 MED ORDER — TRAMADOL HCL 50 MG PO TABS
ORAL_TABLET | ORAL | Status: AC
  Filled 2024-03-30: qty 1

## 2024-03-30 MED ORDER — ONDANSETRON HCL 4 MG/2ML IJ SOLN
INTRAMUSCULAR | Status: DC | PRN
  Administered 2024-03-30: 4 mg via INTRAVENOUS

## 2024-03-30 MED ORDER — ACETAMINOPHEN 500 MG PO TABS: 1000.0000 mg | ORAL_TABLET | Freq: Once | ORAL | Status: DC

## 2024-03-30 MED ORDER — DEXAMETHASONE SODIUM PHOSPHATE 4 MG/ML IJ SOLN
INTRAMUSCULAR | Status: DC | PRN
  Administered 2024-03-30: 5 mg via INTRAVENOUS

## 2024-03-30 MED ORDER — PHENYLEPHRINE 80 MCG/ML (10ML) SYRINGE FOR IV PUSH (FOR BLOOD PRESSURE SUPPORT)
PREFILLED_SYRINGE | INTRAVENOUS | Status: AC
  Filled 2024-03-30: qty 20

## 2024-03-30 MED ORDER — VANCOMYCIN HCL 1000 MG IV SOLR
INTRAVENOUS | Status: DC | PRN
  Administered 2024-03-30: 1000 mg via TOPICAL

## 2024-03-30 MED ORDER — LACTATED RINGERS IV SOLN: INTRAVENOUS | Status: DC

## 2024-03-30 MED ORDER — PROPOFOL 500 MG/50ML IV EMUL
INTRAVENOUS | Status: DC | PRN
  Administered 2024-03-30: 50 ug/kg/min via INTRAVENOUS

## 2024-03-30 MED ORDER — LIDOCAINE 2% (20 MG/ML) 5 ML SYRINGE
INTRAMUSCULAR | Status: AC
  Filled 2024-03-30: qty 15

## 2024-03-30 MED ORDER — FENTANYL CITRATE (PF) 100 MCG/2ML IJ SOLN
INTRAMUSCULAR | Status: AC
  Filled 2024-03-30: qty 2

## 2024-03-30 MED ORDER — ASPIRIN 325 MG PO TBEC: 325.0000 mg | DELAYED_RELEASE_TABLET | Freq: Every day | ORAL | 0 refills | Status: DC

## 2024-03-30 MED ORDER — GABAPENTIN 300 MG PO CAPS: 300.0000 mg | ORAL_CAPSULE | Freq: Once | ORAL | Status: DC

## 2024-03-30 MED ORDER — PHENYLEPHRINE HCL-NACL 20-0.9 MG/250ML-% IV SOLN
INTRAVENOUS | Status: AC
  Filled 2024-03-30: qty 250

## 2024-03-30 MED ORDER — LIDOCAINE HCL (CARDIAC) PF 100 MG/5ML IV SOSY
PREFILLED_SYRINGE | INTRAVENOUS | Status: DC | PRN
  Administered 2024-03-30: 100 mg via INTRAVENOUS

## 2024-03-30 MED ORDER — OXYCODONE HCL 5 MG PO TABS
5.0000 mg | ORAL_TABLET | ORAL | 0 refills | Status: AC | PRN
Start: 2024-03-30 — End: ?

## 2024-03-30 MED ORDER — FENTANYL CITRATE (PF) 100 MCG/2ML IJ SOLN: 25.0000 ug | INTRAMUSCULAR | Status: DC | PRN

## 2024-03-30 MED ORDER — GABAPENTIN 300 MG PO CAPS
ORAL_CAPSULE | ORAL | Status: AC
  Filled 2024-03-30: qty 1

## 2024-03-30 MED ORDER — EPHEDRINE 5 MG/ML INJ
INTRAVENOUS | Status: AC
  Filled 2024-03-30: qty 15

## 2024-03-30 MED ORDER — IBUPROFEN 800 MG PO TABS: 800.0000 mg | ORAL_TABLET | Freq: Three times a day (TID) | ORAL | 0 refills | Status: AC

## 2024-03-30 SURGICAL SUPPLY — 39 items
ANCHOR JUGGERKNOT SOFT 1.5 (Anchor) ×2 IMPLANT
ANCHOR JUGGERKNOT SOFT 2.9 (Anchor) IMPLANT
ANCHOR SUT QUATTRO KNTLS 4.5 (Anchor) IMPLANT
BLADE SURG 10 STRL SS (BLADE) ×2 IMPLANT
BLADE SURG 15 STRL LF DISP TIS (BLADE) ×2 IMPLANT
CHLORAPREP W/TINT 26 (MISCELLANEOUS) ×2 IMPLANT
COOLER ICEMAN CLASSIC (MISCELLANEOUS) ×2 IMPLANT
COVER BACK TABLE 60X90IN (DRAPES) ×2 IMPLANT
COVER MAYO STAND STRL (DRAPES) ×2 IMPLANT
DRAPE STERI IOBAN 125X83 (DRAPES) ×2 IMPLANT
DRAPE U-SHAPE 47X51 STRL (DRAPES) ×4 IMPLANT
DRSG AQUACEL AG ADV 3.5X 6 (GAUZE/BANDAGES/DRESSINGS) ×2 IMPLANT
ELECT REM PT RETURN 9FT ADLT (ELECTROSURGICAL) ×1 IMPLANT
ELECTRODE REM PT RTRN 9FT ADLT (ELECTROSURGICAL) ×2 IMPLANT
GAUZE XEROFORM 1X8 LF (GAUZE/BANDAGES/DRESSINGS) IMPLANT
GLOVE BIO SURGEON STRL SZ 6 (GLOVE) ×4 IMPLANT
GLOVE BIO SURGEON STRL SZ7.5 (GLOVE) ×4 IMPLANT
GLOVE BIOGEL PI IND STRL 6.5 (GLOVE) ×2 IMPLANT
GLOVE BIOGEL PI IND STRL 8 (GLOVE) ×2 IMPLANT
GOWN STRL REUS W/ TWL LRG LVL3 (GOWN DISPOSABLE) ×4 IMPLANT
GOWN STRL REUS W/TWL XL LVL3 (GOWN DISPOSABLE) ×2 IMPLANT
MANIFOLD NEPTUNE II (INSTRUMENTS) ×2 IMPLANT
NDL HYPO 22X1.5 SAFETY MO (MISCELLANEOUS) ×2 IMPLANT
NEEDLE HYPO 22X1.5 SAFETY MO (MISCELLANEOUS) ×1 IMPLANT
NS IRRIG 1000ML POUR BTL (IV SOLUTION) ×2 IMPLANT
PACK BASIN DAY SURGERY FS (CUSTOM PROCEDURE TRAY) ×2 IMPLANT
PAD COLD SHLDR WRAP-ON (PAD) ×2 IMPLANT
PENCIL SMOKE EVACUATOR (MISCELLANEOUS) ×2 IMPLANT
SLEEVE SCD COMPRESS KNEE MED (STOCKING) ×2 IMPLANT
SPONGE T-LAP 18X18 ~~LOC~~+RFID (SPONGE) ×2 IMPLANT
SUCTION TUBE FRAZIER 10FR DISP (SUCTIONS) ×2 IMPLANT
SUT ETHILON 3 0 PS 1 (SUTURE) IMPLANT
SUT VIC AB 0 CT1 27XBRD ANBCTR (SUTURE) ×2 IMPLANT
SUT VIC AB 2-0 CT1 TAPERPNT 27 (SUTURE) ×2 IMPLANT
SYR 20ML LL LF (SYRINGE) ×2 IMPLANT
SYR BULB EAR ULCER 3OZ GRN STR (SYRINGE) ×2 IMPLANT
TOWEL GREEN STERILE FF (TOWEL DISPOSABLE) ×4 IMPLANT
TUBE CONNECTING 20X1/4 (TUBING) ×2 IMPLANT
YANKAUER SUCT BULB TIP NO VENT (SUCTIONS) ×2 IMPLANT

## 2024-03-30 NOTE — Transfer of Care (Signed)
 Immediate Anesthesia Transfer of Care Note  Patient: [name]  Procedure(s) Performed: LEFT GLUTEUS MINIMUS TENDON REPAIR (Left: Hip)  Patient Location: PACU  Anesthesia Type:General  Level of Consciousness: awake, alert , and patient cooperative  Airway & Oxygen Therapy: Patient Spontanous Breathing and Patient connected to nasal cannula oxygen  Post-op Assessment: Report given to RN and Post -op Vital signs reviewed and stable  Post vital signs: Reviewed and stable  Last Vitals:  Vitals Value Taken Time  BP 172/65 03/30/24 1100  Temp    Pulse 61 03/30/24 1101  Resp 20 03/30/24 1101  SpO2 96 % 03/30/24 1101  Vitals shown include unfiled device data.  Last Pain:  Vitals:   03/30/24 0845  TempSrc: Temporal  PainSc: 8       Patients Stated Pain Goal: 5 (03/30/24 0845)  Complications: No notable events documented.
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
## 2024-03-30 NOTE — Discharge Instructions (Addendum)
 Discharge Instructions    Attending Surgeon: [name], MD Office Phone Number: [phone]   Diagnosis and Procedures:    Surgeries Performed: Left hip gluteus maximus tendon transfer  Discharge Plan:    Diet: Resume usual diet. Begin with light or bland foods.  Drink plenty of fluids.  Activity:  Weight bearing as tolerated left leg. You are advised to go home directly from the hospital or surgical center. Restrict your activities.  GENERAL INSTRUCTIONS: 1.  Please apply ice to your wound to help with swelling and inflammation. This will improve your comfort and your overall recovery following surgery.     2. Please call Dr. Serena Croissant office at [phone] with questions Monday-Friday during business hours. If no one answers, please leave a message and someone should get back to the patient within 24 hours. For emergencies please call 911 or proceed to the emergency room.   3. Patient to notify surgical team if experiences any of the following: Bowel/Bladder dysfunction, uncontrolled pain, nerve/muscle weakness, incision with increased drainage or redness, nausea/vomiting and Fever greater than 101.0 F.  Be alert for signs of infection including redness, streaking, odor, fever or chills. Be alert for excessive pain or bleeding and notify your surgeon immediately.  WOUND INSTRUCTIONS:   Leave your dressing, cast, or splint in place until your post operative visit.  Keep it clean and dry.  Always keep the incision clean and dry until the staples/sutures are removed. If there is no drainage from the incision you should keep it open to air. If there is drainage from the incision you must keep it covered at all times until the drainage stops  Do not soak in a bath tub, hot tub, pool, lake or other body of water until 21 days after your surgery and your incision is completely dry and healed.  If you have removable sutures (or staples) they must be removed 10-14 days  (unless otherwise instructed) from the day of your surgery.     1)  Elevate the extremity as much as possible.  2)  Keep the dressing clean and dry.  3)  Please call us if the dressing becomes wet or dirty.  4)  If you are experiencing worsening pain or worsening swelling, please call.     MEDICATIONS: Resume all previous home medications at the previous prescribed dose and frequency unless otherwise noted Start taking the  pain medications on an as-needed basis as prescribed  Please taper down pain medication over the next week following surgery.  Ideally you should not require a refill of any narcotic pain medication.  Take pain medication with food to minimize nausea. In addition to the prescribed pain medication, you may take over-the-counter pain relievers such as Tylenol.  Do NOT take additional tylenol if your pain medication already has tylenol in it.  Aspirin 325mg  daily per instructions on bottle. Narcotic policy: Per [facility] clinic policy, our goal is ensure optimal postoperative pain control with a multimodal pain management strategy. For all [facility] patients, our goal is to wean post-operative narcotic medications by 6 weeks post-operatively, and many times sooner. If this is not possible due to utilization of pain medication prior to surgery, your [facility] doctor will support your acute post-operative pain control for the first 6 weeks postoperatively, with a plan to transition you back to your primary pain team following that. Cyndia Skeeters will work to ensure a Therapist, occupational.       FOLLOWUP INSTRUCTIONS: 1. Follow up at the  Physical Therapy Clinic 3-4 days following surgery. This appointment should be scheduled unless other arrangements have been made.The Physical Therapy scheduling number is [phone] if an appointment has not already been arranged.  2. Contact Dr. Serena Croissant office during office hours at [phone] or the practice after hours line at [phone] for  non-emergencies. For medical emergencies call 911.   Discharge Location: Home   Post Anesthesia Home Care Instructions  Activity: Get plenty of rest for the remainder of the day. A responsible individual must stay with you for 24 hours following the procedure.  For the next 24 hours, DO NOT: -Drive a car -Advertising copywriter -Drink alcoholic beverages -Take any medication unless instructed by your physician -Make any legal decisions or sign important papers.  Meals: Start with liquid foods such as gelatin or soup. Progress to regular foods as tolerated. Avoid greasy, spicy, heavy foods. If nausea and/or vomiting occur, drink only clear liquids until the nausea and/or vomiting subsides. Call your physician if vomiting continues.  Special Instructions/Symptoms: Your throat may feel dry or sore from the anesthesia or the breathing tube placed in your throat during surgery. If this causes discomfort, gargle with warm salt water. The discomfort should disappear within 24 hours.  If you had a scopolamine patch placed behind your ear for the management of post- operative nausea and/or vomiting:  1. The medication in the patch is effective for 72 hours, after which it should be removed.  Wrap patch in a tissue and discard in the trash. Wash hands thoroughly with soap and water. 2. You may remove the patch earlier than 72 hours if you experience unpleasant side effects which may include dry mouth, dizziness or visual disturbances. 3. Avoid touching the patch. Wash your hands with soap and water after contact with the patch.

## 2024-03-30 NOTE — Op Note (Signed)
 Date of Surgery: 03/30/2024  INDICATIONS: Mr. Bifulco is a 83 y.o.-year-old male with left hip gluteus medius tear.  The risk and benefits of the procedure were discussed in detail and documented in the pre-operative evaluation.   PREOPERATIVE DIAGNOSIS: 1. Left hip gluteus medius tear  POSTOPERATIVE DIAGNOSIS: Same.  PROCEDURE: 1. Left hip gluteus maximus tendon transfer 2. Left hip trochanteric bursectomy  SURGEON: Benancio Deeds MD  ASSISTANT: Ardeen Fillers, ATC  ANESTHESIA:  general  IV FLUIDS AND URINE: See anesthesia record.  ANTIBIOTICS: Ancef  ESTIMATED BLOOD LOSS: 10 mL.  IMPLANTS:  Implant Name Type Inv. Item Serial No. Manufacturer Lot No. LRB No. Used Action  ANCHOR SUT QUATTRO KNTLS 4.5 - [id number] Anchor ANCHOR SUT QUATTRO KNTLS 4.5  [employer](ORTH,TRAU,BIO,SG) [id number] Left 1 Implanted  ANCHOR SUT QUATTRO KNTLS 4.5 - [id number] Anchor ANCHOR SUT QUATTRO KNTLS 4.5  [employer](ORTH,TRAU,BIO,SG) [id number] Left 1 Implanted  ANCHOR JUGGERKNOT SOFT 1.5 - [id number] Anchor ANCHOR JUGGERKNOT SOFT 1.5  [employer]ON(ORTH,TRAU,BIO,SG) [id number] Left 1 Implanted  ANCHOR JUGGERKNOT SOFT 1.5 - [id number] Anchor ANCHOR JUGGERKNOT SOFT 1.5  [employer]ON(ORTH,TRAU,BIO,SG) [id number] Left 1 Implanted    DRAINS: None  CULTURES: None  COMPLICATIONS: none  DESCRIPTION OF PROCEDURE:  The patient was identified in the preoperative holding area.  Antibiotics were given 1 hour prior to skin incision.  Timeout was performed and surgical site was marked with nursing.  The patient was subsequently taken back to the operating room.  Anesthesia was induced. The patient was placed in the lateral decubitus position.  Axillary roll and down peroneal nerve was padded well   At this time I began with an approach to the lateral trochanter centered at the vastus ridge.  15 blade was used to incise skin.  This was done down to the level of the IT band.  Electrocautery was utilized to achieve  hemostasis.  At this time the IT band was marked in such a way as to preserve an anterior flap.  This was incised completely with a of an abduction.  Gluteus medius was completely torn and found to be nonviable with significant fatty infiltration.  At this time a flap was mobilized anteriorly from the gluteus maximus in a triangular fashion.  This mobilized well over to the trochanter. The trochanteric bursectomy was seen and excises with Metzenbaums.   A double row construct was utilized with 2 medial row all suture anchors.  These were brought up through the remaining gluteus medius tendon and into the flap.  2 of these were tied to provisionally stabilize the flap.  These were then brought up to the distal most aspect of the flap and brought into 2 lateral row anchors over the native footprint of the gluteus medius.      This time the wound was thoroughly irrigated.  The posterior aspect of the IT band was sutured to the posterior limb of the flap.  This was done with 0 Vicryl.  This was done in an interrupted fashion.  The wound was again irrigated and closed in layers of 0 Vicryl, 2-0 Vicryl and 3-0 nylon.  Xeroform, gauze, Aquacel dressing were applied.   Instrument, sponge, and needle counts were correct prior to wound closure and at the conclusion of the case.  The patient was taken to the PACU without complication         POSTOPERATIVE PLAN: The patient will be wight bearing as tolerated. He will be placed on Aspirin for blood clot prevention.  I will  see the patient back in 2 weeks.   Benancio Deeds, MD 10:42 AM

## 2024-03-30 NOTE — Anesthesia Postprocedure Evaluation (Signed)
 Anesthesia Post Note  Patient: [name]  Procedure(s) Performed: LEFT GLUTEUS MINIMUS TENDON REPAIR (Left: Hip)     Patient location during evaluation: PACU Anesthesia Type: General Level of consciousness: awake Pain management: pain level controlled Vital Signs Assessment: post-procedure vital signs reviewed and stable Respiratory status: spontaneous breathing, nonlabored ventilation and respiratory function stable Cardiovascular status: blood pressure returned to baseline and stable Postop Assessment: no apparent nausea or vomiting Anesthetic complications: no   No notable events documented.  Last Vitals:  Vitals:   03/30/24 1230 03/30/24 1312  BP: (!) 185/70 113/65  Pulse: 63 66  Resp: 16 16  Temp:  (!) 36.3 C  SpO2: 95% 97%    Last Pain:  Vitals:   03/30/24 1312  TempSrc: Temporal  PainSc: 0-No pain                 Linton Rump

## 2024-03-30 NOTE — Interval H&P Note (Signed)
 History and Physical Interval Note:  03/30/2024 8:58 AM  [name]  has presented today for surgery, with the diagnosis of LEFT GLUTEUS MEDIUS TEAR.  The various methods of treatment have been discussed with the patient and family. After consideration of risks, benefits and other options for treatment, the patient has consented to  Procedure(s) with comments: REPAIR, TENDON, GLUTEUS MINIMUS (Left) - LEFT GLUTEUS MAXIMUS TENDON TRANSFER WITH COLLAGEN PATCH AUGMENTATION as a surgical intervention.  The patient's history has been reviewed, patient examined, no change in status, stable for surgery.  I have reviewed the patient's chart and labs.  Questions were answered to the patient's satisfaction.     [name]

## 2024-03-30 NOTE — Brief Op Note (Signed)
   Brief Op Note  Date of Surgery: 03/30/2024  Preoperative Diagnosis: LEFT GLUTEUS MEDIUS TEAR  Postoperative Diagnosis: same  Procedure: Procedure(s): LEFT GLUTEUS MINIMUS TENDON REPAIR  Implants: Implant Name Type Inv. Item Serial No. Manufacturer Lot No. LRB No. Used Action  ANCHOR SUT QUATTRO KNTLS 4.5 - [id number] Anchor ANCHOR SUT QUATTRO KNTLS 4.5  [employer](ORTH,TRAU,BIO,SG) [id number] Left 1 Implanted  ANCHOR SUT QUATTRO KNTLS 4.5 - [id number] Anchor ANCHOR SUT QUATTRO KNTLS 4.5  [employer](ORTH,TRAU,BIO,SG) [id number] Left 1 Implanted  ANCHOR JUGGERKNOT SOFT 1.5 - [id number] Anchor ANCHOR JUGGERKNOT SOFT 1.5  [employer](ORTH,TRAU,BIO,SG) [id number] Left 1 Implanted  ANCHOR JUGGERKNOT SOFT 1.5 - [id number] Anchor ANCHOR JUGGERKNOT SOFT 1.5  [employer](ORTH,TRAU,BIO,SG) [id number] Left 1 Implanted    Surgeons: Surgeon(s): [name], MD  Anesthesia: General    Estimated Blood Loss: See anesthesia record  Complications: None  Condition to PACU: Stable  Benancio Deeds, MD 03/30/2024 10:42 AM

## 2024-03-30 NOTE — Anesthesia Procedure Notes (Signed)
 Procedure Name: Intubation Date/Time: 03/30/2024 10:00 AM  Performed by: Earmon Phoenix, CRNAPre-anesthesia Checklist: Patient identified, Emergency Drugs available, Suction available, Patient being monitored and Timeout performed Patient Re-evaluated:Patient Re-evaluated prior to induction Oxygen Delivery Method: Circle system utilized Preoxygenation: Pre-oxygenation with 100% oxygen Induction Type: IV induction Ventilation: Oral airway inserted - appropriate to patient size Laryngoscope Size: Glidescope and 3 Grade View: Grade I Tube type: Oral Tube size: 7.0 mm Number of attempts: 1 Airway Equipment and Method: Stylet Placement Confirmation: ETT inserted through vocal cords under direct vision, positive ETCO2 and breath sounds checked- equal and bilateral Secured at: 22 cm Tube secured with: Tape Dental Injury: Teeth and Oropharynx as per pre-operative assessment

## 2024-03-31 ENCOUNTER — Telehealth (HOSPITAL_BASED_OUTPATIENT_CLINIC_OR_DEPARTMENT_OTHER): Payer: Self-pay | Admitting: Physical Therapy

## 2024-03-31 NOTE — Telephone Encounter (Signed)
 Called and LVM to remind patient of upcoming physical therapy evaluation appointment. Requested call back to confirm appointment attendance.

## 2024-04-01 ENCOUNTER — Encounter (HOSPITAL_BASED_OUTPATIENT_CLINIC_OR_DEPARTMENT_OTHER): Payer: Self-pay | Admitting: Orthopaedic Surgery

## 2024-04-01 DIAGNOSIS — M4316 Spondylolisthesis, lumbar region: Secondary | ICD-10-CM | POA: Diagnosis not present

## 2024-04-02 ENCOUNTER — Other Ambulatory Visit: Payer: Self-pay

## 2024-04-02 ENCOUNTER — Encounter (HOSPITAL_BASED_OUTPATIENT_CLINIC_OR_DEPARTMENT_OTHER): Payer: Self-pay | Admitting: Physical Therapy

## 2024-04-02 ENCOUNTER — Ambulatory Visit (HOSPITAL_BASED_OUTPATIENT_CLINIC_OR_DEPARTMENT_OTHER): Attending: Orthopaedic Surgery | Admitting: Physical Therapy

## 2024-04-02 DIAGNOSIS — M6281 Muscle weakness (generalized): Secondary | ICD-10-CM | POA: Diagnosis not present

## 2024-04-02 DIAGNOSIS — R2689 Other abnormalities of gait and mobility: Secondary | ICD-10-CM | POA: Diagnosis not present

## 2024-04-02 DIAGNOSIS — M25552 Pain in left hip: Secondary | ICD-10-CM | POA: Diagnosis not present

## 2024-04-02 DIAGNOSIS — M7062 Trochanteric bursitis, left hip: Secondary | ICD-10-CM | POA: Diagnosis not present

## 2024-04-02 DIAGNOSIS — R29898 Other symptoms and signs involving the musculoskeletal system: Secondary | ICD-10-CM | POA: Diagnosis not present
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
## 2024-04-05 ENCOUNTER — Encounter (HOSPITAL_BASED_OUTPATIENT_CLINIC_OR_DEPARTMENT_OTHER): Payer: Self-pay | Admitting: Physical Therapy

## 2024-04-05 ENCOUNTER — Ambulatory Visit (HOSPITAL_BASED_OUTPATIENT_CLINIC_OR_DEPARTMENT_OTHER): Admitting: Physical Therapy

## 2024-04-05 DIAGNOSIS — R2689 Other abnormalities of gait and mobility: Secondary | ICD-10-CM

## 2024-04-05 DIAGNOSIS — M25552 Pain in left hip: Secondary | ICD-10-CM | POA: Diagnosis not present

## 2024-04-05 DIAGNOSIS — M6281 Muscle weakness (generalized): Secondary | ICD-10-CM | POA: Diagnosis not present

## 2024-04-05 DIAGNOSIS — R29898 Other symptoms and signs involving the musculoskeletal system: Secondary | ICD-10-CM | POA: Diagnosis not present

## 2024-04-05 DIAGNOSIS — M7062 Trochanteric bursitis, left hip: Secondary | ICD-10-CM | POA: Diagnosis not present

## 2024-04-07 LAB — HM DIABETES EYE EXAM
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
## 2024-04-12 ENCOUNTER — Ambulatory Visit (HOSPITAL_BASED_OUTPATIENT_CLINIC_OR_DEPARTMENT_OTHER): Payer: Self-pay | Admitting: Physical Therapy

## 2024-04-12 ENCOUNTER — Encounter (HOSPITAL_BASED_OUTPATIENT_CLINIC_OR_DEPARTMENT_OTHER): Admitting: Orthopaedic Surgery

## 2024-04-12 ENCOUNTER — Ambulatory Visit (HOSPITAL_BASED_OUTPATIENT_CLINIC_OR_DEPARTMENT_OTHER): Admitting: Orthopaedic Surgery

## 2024-04-12 ENCOUNTER — Encounter (HOSPITAL_BASED_OUTPATIENT_CLINIC_OR_DEPARTMENT_OTHER): Payer: Self-pay | Admitting: Physical Therapy

## 2024-04-12 DIAGNOSIS — M6281 Muscle weakness (generalized): Secondary | ICD-10-CM

## 2024-04-12 DIAGNOSIS — R2689 Other abnormalities of gait and mobility: Secondary | ICD-10-CM

## 2024-04-12 DIAGNOSIS — R29898 Other symptoms and signs involving the musculoskeletal system: Secondary | ICD-10-CM

## 2024-04-12 DIAGNOSIS — M7062 Trochanteric bursitis, left hip: Secondary | ICD-10-CM

## 2024-04-12 DIAGNOSIS — M25552 Pain in left hip: Secondary | ICD-10-CM
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
## 2024-04-15 ENCOUNTER — Encounter: Payer: Self-pay | Admitting: Internal Medicine

## 2024-04-19 ENCOUNTER — Ambulatory Visit (HOSPITAL_BASED_OUTPATIENT_CLINIC_OR_DEPARTMENT_OTHER): Payer: Self-pay | Admitting: Physical Therapy

## 2024-04-19 ENCOUNTER — Encounter (HOSPITAL_BASED_OUTPATIENT_CLINIC_OR_DEPARTMENT_OTHER): Payer: Self-pay | Admitting: Physical Therapy

## 2024-04-19 DIAGNOSIS — M6281 Muscle weakness (generalized): Secondary | ICD-10-CM | POA: Diagnosis not present

## 2024-04-19 DIAGNOSIS — M25552 Pain in left hip: Secondary | ICD-10-CM

## 2024-04-19 DIAGNOSIS — R29898 Other symptoms and signs involving the musculoskeletal system: Secondary | ICD-10-CM | POA: Diagnosis not present

## 2024-04-19 DIAGNOSIS — R2689 Other abnormalities of gait and mobility: Secondary | ICD-10-CM

## 2024-04-19 DIAGNOSIS — M7062 Trochanteric bursitis, left hip: Secondary | ICD-10-CM | POA: Diagnosis not present
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
## 2024-04-20 DIAGNOSIS — M48062 Spinal stenosis, lumbar region with neurogenic claudication: Secondary | ICD-10-CM | POA: Diagnosis not present
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
## 2024-04-26 ENCOUNTER — Encounter (HOSPITAL_BASED_OUTPATIENT_CLINIC_OR_DEPARTMENT_OTHER): Payer: Self-pay | Admitting: Physical Therapy

## 2024-04-26 ENCOUNTER — Ambulatory Visit (HOSPITAL_BASED_OUTPATIENT_CLINIC_OR_DEPARTMENT_OTHER): Payer: Self-pay | Attending: Orthopaedic Surgery | Admitting: Physical Therapy

## 2024-04-26 DIAGNOSIS — M6281 Muscle weakness (generalized): Secondary | ICD-10-CM | POA: Insufficient documentation

## 2024-04-26 DIAGNOSIS — M25552 Pain in left hip: Secondary | ICD-10-CM | POA: Diagnosis not present

## 2024-04-26 DIAGNOSIS — R2689 Other abnormalities of gait and mobility: Secondary | ICD-10-CM | POA: Insufficient documentation

## 2024-04-26 DIAGNOSIS — R29898 Other symptoms and signs involving the musculoskeletal system: Secondary | ICD-10-CM | POA: Diagnosis not present

## 2024-05-03 ENCOUNTER — Ambulatory Visit (HOSPITAL_BASED_OUTPATIENT_CLINIC_OR_DEPARTMENT_OTHER): Payer: Self-pay

## 2024-05-03 ENCOUNTER — Encounter (HOSPITAL_BASED_OUTPATIENT_CLINIC_OR_DEPARTMENT_OTHER): Payer: Self-pay

## 2024-05-03 DIAGNOSIS — R2689 Other abnormalities of gait and mobility: Secondary | ICD-10-CM | POA: Diagnosis not present

## 2024-05-03 DIAGNOSIS — M6281 Muscle weakness (generalized): Secondary | ICD-10-CM

## 2024-05-03 DIAGNOSIS — R29898 Other symptoms and signs involving the musculoskeletal system: Secondary | ICD-10-CM | POA: Diagnosis not present

## 2024-05-03 DIAGNOSIS — M25552 Pain in left hip: Secondary | ICD-10-CM | POA: Diagnosis not present
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
## 2024-05-05 ENCOUNTER — Ambulatory Visit (HOSPITAL_BASED_OUTPATIENT_CLINIC_OR_DEPARTMENT_OTHER): Payer: Self-pay

## 2024-05-05 ENCOUNTER — Encounter (HOSPITAL_BASED_OUTPATIENT_CLINIC_OR_DEPARTMENT_OTHER): Payer: Self-pay

## 2024-05-05 DIAGNOSIS — R29898 Other symptoms and signs involving the musculoskeletal system: Secondary | ICD-10-CM | POA: Diagnosis not present

## 2024-05-05 DIAGNOSIS — M25552 Pain in left hip: Secondary | ICD-10-CM

## 2024-05-05 DIAGNOSIS — R2689 Other abnormalities of gait and mobility: Secondary | ICD-10-CM | POA: Diagnosis not present

## 2024-05-05 DIAGNOSIS — M6281 Muscle weakness (generalized): Secondary | ICD-10-CM | POA: Diagnosis not present

## 2024-05-05 NOTE — Therapy (Signed)
 PT note was deleted and unable to reach IT in attempt to retrieve. Marking as erroneous encounter.  Aaronmichael Brumbaugh C. Boy Delamater PT, DPT 05/05/24 8:30 AM
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
## 2024-05-10 ENCOUNTER — Ambulatory Visit (HOSPITAL_BASED_OUTPATIENT_CLINIC_OR_DEPARTMENT_OTHER): Payer: Self-pay | Admitting: Physical Therapy

## 2024-05-10 ENCOUNTER — Encounter (HOSPITAL_BASED_OUTPATIENT_CLINIC_OR_DEPARTMENT_OTHER): Payer: Self-pay | Admitting: Physical Therapy

## 2024-05-10 DIAGNOSIS — M6281 Muscle weakness (generalized): Secondary | ICD-10-CM

## 2024-05-10 DIAGNOSIS — R29898 Other symptoms and signs involving the musculoskeletal system: Secondary | ICD-10-CM | POA: Diagnosis not present

## 2024-05-10 DIAGNOSIS — M25552 Pain in left hip: Secondary | ICD-10-CM

## 2024-05-10 DIAGNOSIS — R2689 Other abnormalities of gait and mobility: Secondary | ICD-10-CM
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
## 2024-05-12 ENCOUNTER — Encounter (HOSPITAL_BASED_OUTPATIENT_CLINIC_OR_DEPARTMENT_OTHER): Payer: Self-pay

## 2024-05-12 ENCOUNTER — Ambulatory Visit (HOSPITAL_BASED_OUTPATIENT_CLINIC_OR_DEPARTMENT_OTHER): Payer: Self-pay

## 2024-05-12 DIAGNOSIS — R29898 Other symptoms and signs involving the musculoskeletal system: Secondary | ICD-10-CM | POA: Diagnosis not present

## 2024-05-12 DIAGNOSIS — R2689 Other abnormalities of gait and mobility: Secondary | ICD-10-CM

## 2024-05-12 DIAGNOSIS — M25552 Pain in left hip: Secondary | ICD-10-CM

## 2024-05-12 DIAGNOSIS — M6281 Muscle weakness (generalized): Secondary | ICD-10-CM
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
## 2024-05-13 ENCOUNTER — Encounter: Payer: Self-pay | Admitting: Internal Medicine

## 2024-05-13 ENCOUNTER — Ambulatory Visit (INDEPENDENT_AMBULATORY_CARE_PROVIDER_SITE_OTHER): Admitting: Orthopaedic Surgery

## 2024-05-13 DIAGNOSIS — M7062 Trochanteric bursitis, left hip: Secondary | ICD-10-CM

## 2024-05-13 MED ORDER — OXYCODONE HCL 5 MG PO TABS: 5.0000 mg | ORAL_TABLET | ORAL | 0 refills | Status: AC | PRN
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
## 2024-05-18 ENCOUNTER — Ambulatory Visit (HOSPITAL_BASED_OUTPATIENT_CLINIC_OR_DEPARTMENT_OTHER): Admitting: Physical Therapy

## 2024-05-18 DIAGNOSIS — R2689 Other abnormalities of gait and mobility: Secondary | ICD-10-CM

## 2024-05-18 DIAGNOSIS — M25552 Pain in left hip: Secondary | ICD-10-CM

## 2024-05-18 DIAGNOSIS — R29898 Other symptoms and signs involving the musculoskeletal system: Secondary | ICD-10-CM

## 2024-05-18 DIAGNOSIS — M6281 Muscle weakness (generalized): Secondary | ICD-10-CM | POA: Diagnosis not present

## 2024-05-19 ENCOUNTER — Encounter (HOSPITAL_BASED_OUTPATIENT_CLINIC_OR_DEPARTMENT_OTHER): Payer: Self-pay | Admitting: Physical Therapy

## 2024-05-19 DIAGNOSIS — M5440 Lumbago with sciatica, unspecified side: Secondary | ICD-10-CM | POA: Diagnosis not present

## 2024-05-19 DIAGNOSIS — Z6826 Body mass index (BMI) 26.0-26.9, adult: Secondary | ICD-10-CM | POA: Diagnosis not present

## 2024-05-19 DIAGNOSIS — M48062 Spinal stenosis, lumbar region with neurogenic claudication: Secondary | ICD-10-CM | POA: Diagnosis not present

## 2024-05-20 ENCOUNTER — Encounter (HOSPITAL_BASED_OUTPATIENT_CLINIC_OR_DEPARTMENT_OTHER): Payer: Self-pay | Admitting: Physical Therapy

## 2024-05-20 ENCOUNTER — Ambulatory Visit (HOSPITAL_BASED_OUTPATIENT_CLINIC_OR_DEPARTMENT_OTHER): Admitting: Physical Therapy

## 2024-05-20 DIAGNOSIS — R2689 Other abnormalities of gait and mobility: Secondary | ICD-10-CM | POA: Diagnosis not present

## 2024-05-20 DIAGNOSIS — M25552 Pain in left hip: Secondary | ICD-10-CM

## 2024-05-20 DIAGNOSIS — M6281 Muscle weakness (generalized): Secondary | ICD-10-CM

## 2024-05-20 DIAGNOSIS — R29898 Other symptoms and signs involving the musculoskeletal system: Secondary | ICD-10-CM | POA: Diagnosis not present
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
## 2024-05-24 ENCOUNTER — Ambulatory Visit (HOSPITAL_BASED_OUTPATIENT_CLINIC_OR_DEPARTMENT_OTHER): Attending: Orthopaedic Surgery | Admitting: Physical Therapy

## 2024-05-24 ENCOUNTER — Encounter (HOSPITAL_BASED_OUTPATIENT_CLINIC_OR_DEPARTMENT_OTHER): Payer: Self-pay | Admitting: Physical Therapy

## 2024-05-24 DIAGNOSIS — M25552 Pain in left hip: Secondary | ICD-10-CM | POA: Diagnosis not present

## 2024-05-24 DIAGNOSIS — R2689 Other abnormalities of gait and mobility: Secondary | ICD-10-CM | POA: Diagnosis not present

## 2024-05-24 DIAGNOSIS — R600 Localized edema: Secondary | ICD-10-CM | POA: Diagnosis not present

## 2024-05-24 DIAGNOSIS — M6281 Muscle weakness (generalized): Secondary | ICD-10-CM | POA: Diagnosis not present

## 2024-05-24 DIAGNOSIS — E663 Overweight: Secondary | ICD-10-CM | POA: Diagnosis not present

## 2024-05-24 DIAGNOSIS — Z6825 Body mass index (BMI) 25.0-25.9, adult: Secondary | ICD-10-CM | POA: Diagnosis not present

## 2024-05-24 DIAGNOSIS — Z79899 Other long term (current) drug therapy: Secondary | ICD-10-CM | POA: Diagnosis not present

## 2024-05-24 DIAGNOSIS — M0609 Rheumatoid arthritis without rheumatoid factor, multiple sites: Secondary | ICD-10-CM | POA: Diagnosis not present

## 2024-05-24 DIAGNOSIS — M5136 Other intervertebral disc degeneration, lumbar region with discogenic back pain only: Secondary | ICD-10-CM | POA: Diagnosis not present

## 2024-05-24 DIAGNOSIS — R29898 Other symptoms and signs involving the musculoskeletal system: Secondary | ICD-10-CM | POA: Diagnosis not present

## 2024-05-24 DIAGNOSIS — M5030 Other cervical disc degeneration, unspecified cervical region: Secondary | ICD-10-CM | POA: Diagnosis not present

## 2024-05-26 ENCOUNTER — Encounter (HOSPITAL_BASED_OUTPATIENT_CLINIC_OR_DEPARTMENT_OTHER): Payer: Self-pay

## 2024-05-26 ENCOUNTER — Ambulatory Visit (HOSPITAL_BASED_OUTPATIENT_CLINIC_OR_DEPARTMENT_OTHER)

## 2024-05-26 DIAGNOSIS — M25552 Pain in left hip: Secondary | ICD-10-CM | POA: Diagnosis not present

## 2024-05-26 DIAGNOSIS — R2689 Other abnormalities of gait and mobility: Secondary | ICD-10-CM

## 2024-05-26 DIAGNOSIS — R29898 Other symptoms and signs involving the musculoskeletal system: Secondary | ICD-10-CM | POA: Diagnosis not present

## 2024-05-26 DIAGNOSIS — M6281 Muscle weakness (generalized): Secondary | ICD-10-CM
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
## 2024-05-31 ENCOUNTER — Encounter (HOSPITAL_BASED_OUTPATIENT_CLINIC_OR_DEPARTMENT_OTHER): Payer: Self-pay

## 2024-05-31 ENCOUNTER — Ambulatory Visit (HOSPITAL_BASED_OUTPATIENT_CLINIC_OR_DEPARTMENT_OTHER)

## 2024-05-31 DIAGNOSIS — M25552 Pain in left hip: Secondary | ICD-10-CM

## 2024-05-31 DIAGNOSIS — R2689 Other abnormalities of gait and mobility: Secondary | ICD-10-CM | POA: Diagnosis not present

## 2024-05-31 DIAGNOSIS — M6281 Muscle weakness (generalized): Secondary | ICD-10-CM | POA: Diagnosis not present

## 2024-05-31 DIAGNOSIS — R29898 Other symptoms and signs involving the musculoskeletal system: Secondary | ICD-10-CM | POA: Diagnosis not present
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
## 2024-06-02 ENCOUNTER — Ambulatory Visit (HOSPITAL_BASED_OUTPATIENT_CLINIC_OR_DEPARTMENT_OTHER): Admitting: Physical Therapy

## 2024-06-02 DIAGNOSIS — R2689 Other abnormalities of gait and mobility: Secondary | ICD-10-CM | POA: Diagnosis not present

## 2024-06-02 DIAGNOSIS — R29898 Other symptoms and signs involving the musculoskeletal system: Secondary | ICD-10-CM | POA: Diagnosis not present

## 2024-06-02 DIAGNOSIS — M6281 Muscle weakness (generalized): Secondary | ICD-10-CM

## 2024-06-02 DIAGNOSIS — M25552 Pain in left hip: Secondary | ICD-10-CM
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
## 2024-06-03 ENCOUNTER — Encounter (HOSPITAL_BASED_OUTPATIENT_CLINIC_OR_DEPARTMENT_OTHER): Payer: Self-pay | Admitting: Physical Therapy

## 2024-06-07 ENCOUNTER — Encounter (HOSPITAL_BASED_OUTPATIENT_CLINIC_OR_DEPARTMENT_OTHER): Payer: Self-pay

## 2024-06-07 ENCOUNTER — Ambulatory Visit (HOSPITAL_BASED_OUTPATIENT_CLINIC_OR_DEPARTMENT_OTHER)

## 2024-06-07 DIAGNOSIS — M6281 Muscle weakness (generalized): Secondary | ICD-10-CM

## 2024-06-07 DIAGNOSIS — R29898 Other symptoms and signs involving the musculoskeletal system: Secondary | ICD-10-CM

## 2024-06-07 DIAGNOSIS — R2689 Other abnormalities of gait and mobility: Secondary | ICD-10-CM

## 2024-06-07 DIAGNOSIS — M25552 Pain in left hip: Secondary | ICD-10-CM
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
## 2024-06-08 ENCOUNTER — Ambulatory Visit (INDEPENDENT_AMBULATORY_CARE_PROVIDER_SITE_OTHER): Admitting: Internal Medicine

## 2024-06-08 ENCOUNTER — Encounter: Payer: Self-pay | Admitting: Internal Medicine

## 2024-06-08 VITALS — BP 170/80 | HR 62 | Temp 97.9°F | Resp 12 | Ht 66.0 in | Wt 163.0 lb

## 2024-06-08 DIAGNOSIS — R600 Localized edema: Secondary | ICD-10-CM

## 2024-06-08 DIAGNOSIS — E119 Type 2 diabetes mellitus without complications: Secondary | ICD-10-CM | POA: Diagnosis not present

## 2024-06-08 DIAGNOSIS — R739 Hyperglycemia, unspecified: Secondary | ICD-10-CM

## 2024-06-08 DIAGNOSIS — I10 Essential (primary) hypertension: Secondary | ICD-10-CM
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
## 2024-06-08 NOTE — Assessment & Plan Note (Signed)
 Diabetes: Diet controlled, check A1c, micro. HTN: BP today elevated, at home BPs are consistently normal.  Continue carvedilol , HCTZ, Micardis , check BMP. Hyperlipidemia: Well-controlled on Zetia  plus a low-dose of atorvastatin . Cardiology visit 02/10/2024, reviewed. BP well-controlled at home.  No changes made. Next carotid Doppler September 2025. Aortic stenosis: Next echo 2026 MSK: Surgery 03/2024  for a L hip gluteus medius tear.  Had a tendon transfer. Pain better.  Doing PT. Pain management: Taking tramadol  and oxycodone , alternates, denies feeling excessively sleepy. Venous insufficiency: Patient mention this issue again, exam confirms that the left leg is larger, no obvious cellulitis or phlebitis.  Observation.  RTC 6 months

## 2024-06-08 NOTE — Patient Instructions (Signed)
 Continue checking your blood pressure regularly Blood pressure goal:  between 110/65 and  135/85. If it is consistently higher or lower, let me know     GO TO THE LAB :  Get the blood work   Your results will be posted on MyChart with my comments  Next office visit for a checkup in 6 months

## 2024-06-09 ENCOUNTER — Ambulatory Visit (HOSPITAL_BASED_OUTPATIENT_CLINIC_OR_DEPARTMENT_OTHER): Admitting: Physical Therapy

## 2024-06-09 ENCOUNTER — Encounter (HOSPITAL_BASED_OUTPATIENT_CLINIC_OR_DEPARTMENT_OTHER): Payer: Self-pay | Admitting: Physical Therapy

## 2024-06-09 DIAGNOSIS — R2689 Other abnormalities of gait and mobility: Secondary | ICD-10-CM | POA: Diagnosis not present

## 2024-06-09 DIAGNOSIS — R29898 Other symptoms and signs involving the musculoskeletal system: Secondary | ICD-10-CM | POA: Diagnosis not present

## 2024-06-09 DIAGNOSIS — M25552 Pain in left hip: Secondary | ICD-10-CM

## 2024-06-09 DIAGNOSIS — M6281 Muscle weakness (generalized): Secondary | ICD-10-CM | POA: Diagnosis not present

## 2024-06-09 LAB — BASIC METABOLIC PANEL WITH GFR
BUN: 12 mg/dL (ref 6–23)
CO2: 33 meq/L — ABNORMAL HIGH (ref 19–32)
Calcium: 10.6 mg/dL — ABNORMAL HIGH (ref 8.4–10.5)
Chloride: 101 meq/L (ref 96–112)
Creatinine, Ser: 0.91 mg/dL (ref 0.40–1.50)
GFR: 78.19 mL/min (ref >60.00)
Glucose, Bld: 98 mg/dL (ref 70–99)
Potassium: 3.9 meq/L (ref 3.5–5.1)
Sodium: 140 meq/L (ref 135–145)

## 2024-06-09 LAB — MICROALBUMIN / CREATININE URINE RATIO
Creatinine,U: 86.4 mg/dL
Microalb Creat Ratio: 11 mg/g (ref 0.0–30.0)
Microalb, Ur: 1 mg/dL (ref 0.0–1.9)

## 2024-06-09 LAB — HEMOGLOBIN A1C: Hgb A1c MFr Bld: 6 % (ref 4.6–6.5)
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
## 2024-06-10 ENCOUNTER — Ambulatory Visit: Payer: Self-pay | Admitting: Internal Medicine

## 2024-06-14 ENCOUNTER — Ambulatory Visit (HOSPITAL_BASED_OUTPATIENT_CLINIC_OR_DEPARTMENT_OTHER)

## 2024-06-14 ENCOUNTER — Encounter (HOSPITAL_BASED_OUTPATIENT_CLINIC_OR_DEPARTMENT_OTHER): Payer: Self-pay

## 2024-06-14 DIAGNOSIS — R29898 Other symptoms and signs involving the musculoskeletal system: Secondary | ICD-10-CM

## 2024-06-14 DIAGNOSIS — M25552 Pain in left hip: Secondary | ICD-10-CM | POA: Diagnosis not present

## 2024-06-14 DIAGNOSIS — M6281 Muscle weakness (generalized): Secondary | ICD-10-CM | POA: Diagnosis not present

## 2024-06-14 DIAGNOSIS — R2689 Other abnormalities of gait and mobility: Secondary | ICD-10-CM
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
## 2024-06-16 ENCOUNTER — Ambulatory Visit (HOSPITAL_BASED_OUTPATIENT_CLINIC_OR_DEPARTMENT_OTHER)

## 2024-06-16 ENCOUNTER — Encounter (HOSPITAL_BASED_OUTPATIENT_CLINIC_OR_DEPARTMENT_OTHER): Payer: Self-pay

## 2024-06-16 DIAGNOSIS — M25552 Pain in left hip: Secondary | ICD-10-CM | POA: Diagnosis not present

## 2024-06-16 DIAGNOSIS — M6281 Muscle weakness (generalized): Secondary | ICD-10-CM

## 2024-06-16 DIAGNOSIS — R2689 Other abnormalities of gait and mobility: Secondary | ICD-10-CM

## 2024-06-16 DIAGNOSIS — R29898 Other symptoms and signs involving the musculoskeletal system: Secondary | ICD-10-CM | POA: Diagnosis not present
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
## 2024-06-21 ENCOUNTER — Encounter (HOSPITAL_BASED_OUTPATIENT_CLINIC_OR_DEPARTMENT_OTHER): Payer: Self-pay

## 2024-06-21 ENCOUNTER — Ambulatory Visit (HOSPITAL_BASED_OUTPATIENT_CLINIC_OR_DEPARTMENT_OTHER)

## 2024-06-21 DIAGNOSIS — M25552 Pain in left hip: Secondary | ICD-10-CM

## 2024-06-21 DIAGNOSIS — R29898 Other symptoms and signs involving the musculoskeletal system: Secondary | ICD-10-CM

## 2024-06-21 DIAGNOSIS — R2689 Other abnormalities of gait and mobility: Secondary | ICD-10-CM

## 2024-06-21 DIAGNOSIS — M6281 Muscle weakness (generalized): Secondary | ICD-10-CM
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
## 2024-06-23 ENCOUNTER — Ambulatory Visit (HOSPITAL_BASED_OUTPATIENT_CLINIC_OR_DEPARTMENT_OTHER): Admitting: Orthopaedic Surgery

## 2024-06-23 ENCOUNTER — Emergency Department (HOSPITAL_BASED_OUTPATIENT_CLINIC_OR_DEPARTMENT_OTHER): Admitting: Radiology

## 2024-06-23 ENCOUNTER — Emergency Department (HOSPITAL_BASED_OUTPATIENT_CLINIC_OR_DEPARTMENT_OTHER)

## 2024-06-23 ENCOUNTER — Emergency Department (HOSPITAL_BASED_OUTPATIENT_CLINIC_OR_DEPARTMENT_OTHER)
Admission: EM | Admit: 2024-06-23 | Discharge: 2024-06-23 | Disposition: A | Discharge status: Home / Self Care | Attending: Emergency Medicine | Admitting: Emergency Medicine

## 2024-06-23 ENCOUNTER — Ambulatory Visit (HOSPITAL_BASED_OUTPATIENT_CLINIC_OR_DEPARTMENT_OTHER): Attending: Orthopaedic Surgery | Admitting: Physical Therapy

## 2024-06-23 ENCOUNTER — Other Ambulatory Visit: Payer: Self-pay

## 2024-06-23 ENCOUNTER — Encounter (HOSPITAL_BASED_OUTPATIENT_CLINIC_OR_DEPARTMENT_OTHER): Payer: Self-pay | Admitting: Emergency Medicine

## 2024-06-23 DIAGNOSIS — Z8673 Personal history of transient ischemic attack (TIA), and cerebral infarction without residual deficits: Secondary | ICD-10-CM | POA: Diagnosis not present

## 2024-06-23 DIAGNOSIS — I2510 Atherosclerotic heart disease of native coronary artery without angina pectoris: Secondary | ICD-10-CM | POA: Diagnosis not present

## 2024-06-23 DIAGNOSIS — R600 Localized edema: Secondary | ICD-10-CM | POA: Diagnosis not present

## 2024-06-23 DIAGNOSIS — Z79899 Other long term (current) drug therapy: Secondary | ICD-10-CM | POA: Insufficient documentation

## 2024-06-23 DIAGNOSIS — N189 Chronic kidney disease, unspecified: Secondary | ICD-10-CM | POA: Diagnosis not present

## 2024-06-23 DIAGNOSIS — R011 Cardiac murmur, unspecified: Secondary | ICD-10-CM | POA: Insufficient documentation

## 2024-06-23 DIAGNOSIS — M6281 Muscle weakness (generalized): Secondary | ICD-10-CM | POA: Insufficient documentation

## 2024-06-23 DIAGNOSIS — J029 Acute pharyngitis, unspecified: Secondary | ICD-10-CM | POA: Diagnosis not present

## 2024-06-23 DIAGNOSIS — F0280 Dementia in other diseases classified elsewhere without behavioral disturbance: Secondary | ICD-10-CM | POA: Insufficient documentation

## 2024-06-23 DIAGNOSIS — I129 Hypertensive chronic kidney disease with stage 1 through stage 4 chronic kidney disease, or unspecified chronic kidney disease: Secondary | ICD-10-CM | POA: Insufficient documentation

## 2024-06-23 DIAGNOSIS — Z87891 Personal history of nicotine dependence: Secondary | ICD-10-CM | POA: Insufficient documentation

## 2024-06-23 DIAGNOSIS — G309 Alzheimer's disease, unspecified: Secondary | ICD-10-CM | POA: Insufficient documentation

## 2024-06-23 DIAGNOSIS — R051 Acute cough: Secondary | ICD-10-CM | POA: Diagnosis not present

## 2024-06-23 DIAGNOSIS — R052 Subacute cough: Secondary | ICD-10-CM

## 2024-06-23 DIAGNOSIS — R29898 Other symptoms and signs involving the musculoskeletal system: Secondary | ICD-10-CM | POA: Insufficient documentation

## 2024-06-23 DIAGNOSIS — M7989 Other specified soft tissue disorders: Secondary | ICD-10-CM | POA: Diagnosis not present

## 2024-06-23 DIAGNOSIS — M25552 Pain in left hip: Secondary | ICD-10-CM | POA: Insufficient documentation

## 2024-06-23 DIAGNOSIS — R059 Cough, unspecified: Secondary | ICD-10-CM | POA: Insufficient documentation

## 2024-06-23 DIAGNOSIS — Z981 Arthrodesis status: Secondary | ICD-10-CM | POA: Diagnosis not present

## 2024-06-23 DIAGNOSIS — R2689 Other abnormalities of gait and mobility: Secondary | ICD-10-CM | POA: Insufficient documentation

## 2024-06-23 DIAGNOSIS — D696 Thrombocytopenia, unspecified: Secondary | ICD-10-CM | POA: Insufficient documentation

## 2024-06-23 DIAGNOSIS — M7062 Trochanteric bursitis, left hip: Secondary | ICD-10-CM

## 2024-06-23 LAB — COMPREHENSIVE METABOLIC PANEL WITH GFR
ALT: 15 U/L (ref 0–44)
AST: 24 U/L (ref 15–41)
Albumin: 3.9 g/dL (ref 3.5–5.0)
Alkaline Phosphatase: 79 U/L (ref 38–126)
Anion gap: 10 (ref 5–15)
BUN: 15 mg/dL (ref 8–23)
CO2: 27 mmol/L (ref 22–32)
Calcium: 9.3 mg/dL (ref 8.9–10.3)
Chloride: 99 mmol/L (ref 98–111)
Creatinine, Ser: 0.84 mg/dL (ref 0.61–1.24)
GFR, Estimated: >60 mL/min (ref >60)
Glucose, Bld: 103 mg/dL — ABNORMAL HIGH (ref 70–99)
Potassium: 3.7 mmol/L (ref 3.5–5.1)
Sodium: 136 mmol/L (ref 135–145)
Total Bilirubin: 0.4 mg/dL (ref 0.0–1.2)
Total Protein: 6.1 g/dL — ABNORMAL LOW (ref 6.5–8.1)

## 2024-06-23 LAB — CBC
HCT: 39.5 % (ref 39.0–52.0)
Hemoglobin: 13.5 g/dL (ref 13.0–17.0)
MCH: 31.6 pg (ref 26.0–34.0)
MCHC: 34.2 g/dL (ref 30.0–36.0)
MCV: 92.5 fL (ref 80.0–100.0)
Platelets: 149 10*3/uL — ABNORMAL LOW (ref 150–400)
RBC: 4.27 MIL/uL (ref 4.22–5.81)
RDW: 12.7 % (ref 11.5–15.5)
WBC: 5.9 10*3/uL (ref 4.0–10.5)
nRBC: 0 % (ref 0.0–0.2)

## 2024-06-23 LAB — RESP PANEL BY RT-PCR (RSV, FLU A&B, COVID)  RVPGX2
Influenza A by PCR: NEGATIVE
Influenza B by PCR: NEGATIVE
Resp Syncytial Virus by PCR: NEGATIVE
SARS Coronavirus 2 by RT PCR: NEGATIVE

## 2024-06-23 LAB — GROUP A STREP BY PCR: Group A Strep by PCR: NOT DETECTED

## 2024-06-23 MED ORDER — LIDOCAINE VISCOUS HCL 2 % MT SOLN
15.0000 mL | Freq: Once | OROMUCOSAL | Status: AC
  Administered 2024-06-23: 15 mL via OROMUCOSAL
  Filled 2024-06-23: qty 15

## 2024-06-23 MED ORDER — ALUM & MAG HYDROXIDE-SIMETH 200-200-20 MG/5ML PO SUSP
30.0000 mL | Freq: Once | ORAL | Status: AC
  Administered 2024-06-23: 30 mL via ORAL
  Filled 2024-06-23: qty 30

## 2024-06-23 MED ORDER — PANTOPRAZOLE SODIUM 20 MG PO TBEC: 20.0000 mg | DELAYED_RELEASE_TABLET | Freq: Every day | ORAL | 1 refills | Status: AC

## 2024-06-23 MED ORDER — BENZONATATE 100 MG PO CAPS
100.0000 mg | ORAL_CAPSULE | Freq: Three times a day (TID) | ORAL | 0 refills | Status: AC | PRN
Start: 2024-06-23 — End: ?

## 2024-06-23 MED ORDER — IOHEXOL 350 MG/ML SOLN
75.0000 mL | Freq: Once | INTRAVENOUS | Status: AC | PRN
Start: 2024-06-23 — End: 2024-06-23
  Administered 2024-06-23: 75 mL via INTRAVENOUS

## 2024-06-23 MED ORDER — MAALOX MAX 400-400-40 MG/5ML PO SUSP: 15.0000 mL | Freq: Four times a day (QID) | ORAL | 0 refills | Status: AC | PRN

## 2024-06-23 NOTE — ED Notes (Signed)
Reviewed discharge instructions, medications, and home care with pt. Pt verbalized understanding and had no further questions. Pt exited ED without complications.

## 2024-06-23 NOTE — ED Notes (Signed)
 Patient transported to CT

## 2024-06-23 NOTE — Discharge Instructions (Addendum)
 Your workup today was reassuring.  Your strep testing was negative.  Your COVID, flu, RSV testing was also negative.  The CT scan of your chest did not show obvious pneumonia, fluid consolidation or other acute abnormality as cause of your cough or sore throat.  We will try a couple different medications to see if you find some relief including reflux medicine as well as Maalox to use as needed to see if this is related to GERD at all.  Will also send in a cough suppressant to use as needed.  Recommend close follow-up with primary care to reassess your symptoms.  Please do not hesitate to return to the emergency department if the worrisome signs and symptoms we discussed become apparent.
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
## 2024-06-23 NOTE — ED Triage Notes (Signed)
 Pt caox4, ambulatory c/o sore throat and non-productive cough x 2wks.

## 2024-06-23 NOTE — ED Notes (Signed)
 ED Provider at bedside.

## 2024-06-23 NOTE — ED Provider Notes (Signed)
 [place] EMERGENCY DEPARTMENT AT [facility] Provider Note   CSN: [id number] Arrival date & time: 06/23/24  1108     Patient presents with: Sore Throat   [name] is a 83 y.o. male.    Sore Throat   83 year old male presents emergency department complaints of cough, sore throat.  Symptoms present past 2 weeks.  Reports cough persistent without acute worsening.  States that over the past 2 days, has noticed spots of red in his coughing without gross hemoptysis.  Denies any fevers, chills, chest pain, shortness of breath.  Has been trying over-the-counter medications without significant improvement of symptoms.  Presents emergency department for further assessment/evaluation.  Past medical history significant for Alzheimer's disease, MI, CKD, GERD, hypertension, hyperlipidemia, restless leg syndrome, OSA, CVA, esophageal stricture  Prior to Admission medications   Medication Sig Start Date End Date Taking? Authorizing Provider  atorvastatin  (LIPITOR) 10 MG tablet TAKE 1 TABLET DAILY 08/05/23   Darron Deatrice LABOR, MD  Calcium  Carbonate-Vit D-Min (CALCIUM  1200 PO) Take 1 tablet by mouth in the morning. Gel cap    [provider]  carvedilol  (COREG ) 3.125 MG tablet Take 1 tablet (3.125 mg total) by mouth 2 (two) times daily with a meal. 09/09/23   Darron Deatrice LABOR, MD  CHELATED MAGNESIUM  PO Take 200 mg by mouth daily.    [provider]  Cholecalciferol  (HM VITAMIN D3) 100 MCG (4000 UT) CAPS Take 20,000 Units by mouth in the morning.    [provider]  CINNAMON  PO Take 4,000 mg by mouth daily.    [provider]  ezetimibe  (ZETIA ) 10 MG tablet Take 1 tablet (10 mg total) by mouth daily. Please call the office and schedule your 6 month follow up [phone] 01/09/24   Darron Deatrice LABOR, MD  folic acid  (FOLVITE ) 1 MG tablet Take 1 mg by mouth daily. 05/16/22   [provider]  Glucosamine-Chondroit-Vit C-Mn (GLUCOSAMINE 1500 COMPLEX  PO) Take 1,200 mg by mouth every morning.    [provider]  hydrochlorothiazide  (HYDRODIURIL ) 25 MG tablet Alternate: 1 tab a day with 2 tabs a day 12/10/23   Paz, Jose E, MD  Lactobacillus (PROBIOTIC ACIDOPHILUS PO) Take 3 capsules by mouth daily.    [provider]  lidocaine  (LIDODERM ) 5 % Place 1 patch onto the skin daily as needed (moderate pain). Remove & Discard patch within 12 hours or as directed by MD 06/17/23   Amon Aloysius BRAVO, MD  loratadine  (CLARITIN ) 10 MG tablet Take 10 mg by mouth daily.    [provider]  methotrexate  2.5 MG tablet Take 20 mg by mouth every Thursday. 05/16/22   [provider]  Multiple Vitamins-Minerals (PRESERVISION AREDS 2) CAPS Take 1 capsule by mouth 2 (two) times daily.    [provider]  multivitamin [facility]) per tablet Take 1 tablet by mouth every morning. Centrum 50+    [provider]  OVER THE COUNTER MEDICATION Take 2 tablets by mouth 2 (two) times daily. Herp rescue    [provider]  oxyCODONE  (ROXICODONE ) 5 MG immediate release tablet Take 1 tablet (5 mg total) by mouth every 4 (four) hours as needed for severe pain (pain score 7-10) or breakthrough pain. 03/30/24   Genelle Standing, MD  oxyCODONE  (ROXICODONE ) 5 MG immediate release tablet Take 1 tablet (5 mg total) by mouth every 4 (four) hours as needed. 05/13/24   Genelle Standing, MD  telmisartan  (MICARDIS ) 40 MG tablet TAKE ONE-HALF (1/2) TABLET DAILY (  AFTER CUTTING DISCARD THE UNUSED PARTIAL TABLET) 11/22/23   Paz, Jose E, MD  traMADol  (ULTRAM ) 50 MG tablet  03/04/23   [provider]  valACYclovir  (VALTREX ) 1000 MG tablet TAKE 2 TABLETS TWICE A DAY FOR 1 DAY WITH THE ONSET OF FEVER BLISTERS Patient taking differently: Take 1,000 mg by mouth 2 (two) times daily. 06/04/22   Amon Aloysius BRAVO, MD    Allergies: Calcium  channel blockers, Felodipine, Hydrocodone -acetaminophen , Other, Oxycodone -acetaminophen , Penicillins, Tylenol   [acetaminophen ], and Vioxx [rofecoxib]    Review of Systems  All other systems reviewed and are negative.   Updated Vital Signs BP (!) 184/74   Pulse (!) 57   Temp 98.1 F (36.7 C) (Oral)   Resp 20   Ht 5' 6 (1.676 m)   SpO2 99%   BMI 26.31 kg/m   Physical Exam Vitals and nursing note reviewed.  Constitutional:      General: He is not in acute distress.    Appearance: He is well-developed.  HENT:     Head: Normocephalic and atraumatic.     Mouth/Throat:     Comments: Moderate posterior pharyngeal erythema.  Uvula midline rises symmetrical phonation.  No sublingual or similar swelling.  Tonsils 0+ bilaterally without exudate. Eyes:     Conjunctiva/sclera: Conjunctivae normal.  Cardiovascular:     Rate and Rhythm: Normal rate and regular rhythm.     Heart sounds: Murmur heard.  Pulmonary:     Effort: Pulmonary effort is normal. No respiratory distress.     Breath sounds: Normal breath sounds. No wheezing, rhonchi or rales.  Abdominal:     Palpations: Abdomen is soft.     Tenderness: There is no abdominal tenderness.  Musculoskeletal:        General: No swelling.     Cervical back: Neck supple.     Comments: 1+ pitting edema left lower extremity.  Skin:    General: Skin is warm and dry.     Capillary Refill: Capillary refill takes less than 2 seconds.  Neurological:     Mental Status: He is alert.  Psychiatric:        Mood and Affect: Mood normal.     (all labs ordered are listed, but only abnormal results are displayed) Labs Reviewed  GROUP A STREP BY PCR  RESP PANEL BY RT-PCR (RSV, FLU A&B, COVID)  RVPGX2  CBC    EKG: None  Radiology: No results found.   Procedures   Medications Ordered in the ED - No data to display                                  Medical Decision Making Amount and/or Complexity of Data Reviewed Labs: ordered. Radiology: ordered.  Risk OTC drugs. Prescription drug management.   This patient presents to the ED for  concern of throat, cough, this involves an extensive number of treatment options, and is a complaint that carries with it a high risk of complications and morbidity.  The differential diagnosis includ sore es viral URI, COVID, flu, RSV, pneumonia, PE, ACS, TB, CHF, asthma, COPD, other   Co morbidities that complicate the patient evaluation  See HPI   Additional history obtained:  Additional history obtained from EMR External records from outside source obtained and reviewed including hospital records   Lab Tests:  I Ordered, and personally interpreted labs.  The pertinent results include: No leukocytosis.  No evidence of anemia.  Thrombocytopenia  of 149.  No Electra abnormalities.  No transaminitis.  No initial history.  Patient negative.  Viral testing negative.   Imaging Studies ordered:  I ordered imaging studies including left lower extremity ultrasound, chest x-ray, CT angio chest PE I independently visualized and interpreted imaging which showed  Left lower extremity ultrasound: No DVT Chest x-ray: No acute pulm abnormality CT angio chest PE: No PE.  Coronary artery calcifications I agree with the radiologist interpretation   Cardiac Monitoring: / EKG:  The patient was maintained on a cardiac monitor.  I personally viewed and interpreted the cardiac monitored which showed an underlying rhythm of: Sinus rhythm   Consultations Obtained:  N/a   Problem List / ED Course / Critical interventions / Medication management  Cough, sore throat I ordered medication including Maalox, lidocaine    Reevaluation of the patient after these medicines showed that the patient improved I have reviewed the patients home medicines and have made adjustments as needed   Social Determinants of Health:  Former cigarette use.  Denies illicit drug use.   Test / Admission - Considered:  Cough, sore throat Vitals signs significant for hypertension blood pressure 144/99. Otherwise within  normal range and stable throughout visit. Laboratory/imaging studies significant for: See above 83 year old male presents emergency department complaints of cough, sore throat.  Symptoms present past 2 weeks.  Reports cough persistent without acute worsening.  States that over the past 2 days, has noticed spots of red in his coughing without gross hemoptysis.  Denies any fevers, chills, chest pain, shortness of breath.  Has been trying over-the-counter medications without significant improvement of symptoms.  Presents emergency department for further assessment/evaluation. On exam, lungs clear to oscillation bilaterally.  No abdominal tenderness.  Posterior pharyngeal erythema present but without evidence clinical evidence of PTA, Ludwig angina, angioedema, auscultatory stridor.  Given patient's continued cough for the past couple of weeks, chest x-ray and subsequently CT imaging of patient's chest was obtained which did not show evidence of acute abnormality.  Patient did have unilateral leg swelling of which he states been present for the last year or so waxing and waning in and severity so DVT ultrasound was obtained which did not show evidence of DVT.  Unsure of exact etiology of patient's 2-week of cough and sore throat.  Patient does have history of GERD but not on any reflux medication at this time after Nissen fundoplication years ago.  Patient did note some improvement of his symptoms with lidocaine /Maalox orally.  Will trial cough suppressant, PPI to see if this gives any relief to patient's symptoms.  Will recommend follow-up with PCP/GI in the outpatient setting for reassessment.  Treatment plan discussed with patient and he acknowledged understanding was agreeable to said plan.  Patient overall well-appearing, afebrile in no acute distress. Worrisome signs and symptoms were discussed with the patient, and the patient acknowledged understanding to return to the ED if noticed. Patient was stable  upon discharge.       Final diagnoses:  None    ED Discharge Orders     None          Silver Wonda LABOR, [place] 06/23/24 1610    Tegeler, Lonni PARAS, MD 06/28/24 [phone]

## 2024-06-24 NOTE — Therapy (Signed)
 [facility] Outpatient Rehabilitation at [facility] [address] [place], [place], [address] Phone: [phone]   Fax:  [phone]  Patient Details  Name: [name] MRN: [id number] Date of Birth: 1941/07/27 Referring Provider:  Genelle Standing, MD  Encounter Date: 06/23/2024  Pt arrived to treatment today and stated he was feeling worse.  Pt has been sick recently though states he feels worse today.  Pt is hoarse and states he can't get rid of this cough.  Pt stated he thought he would go to the ER later today.  PT withheld treatment today due to pt not feeling well and instructed pt to see MD.    Leigh Minerva III PT, DPT 06/24/24 2:04 PM   [facility] at [facility] [address] [place], [place], [address] Phone: [phone]   Fax:  [phone]

## 2024-07-08 ENCOUNTER — Other Ambulatory Visit: Payer: Self-pay | Admitting: Internal Medicine

## 2024-07-21 DIAGNOSIS — M48062 Spinal stenosis, lumbar region with neurogenic claudication: Secondary | ICD-10-CM | POA: Diagnosis not present

## 2024-08-18 ENCOUNTER — Telehealth: Payer: Self-pay | Admitting: Internal Medicine

## 2024-08-18 NOTE — Telephone Encounter (Signed)
 Copied from CRM [id number]. Topic: Medicare AWV >> Aug 18, 2024  1:48 PM Nathanel DEL wrote: Reason for CRM: Called LVM 08/18/2024 to schedule AWV. Please schedule office or virtual visits.  Nathanel Paschal; Care Guide Ambulatory Clinical Support [facility] [facility] Direct Dial: [phone]

## 2024-08-19 DIAGNOSIS — M5440 Lumbago with sciatica, unspecified side: Secondary | ICD-10-CM | POA: Diagnosis not present

## 2024-08-24 ENCOUNTER — Other Ambulatory Visit: Payer: Self-pay | Admitting: Cardiovascular Disease

## 2024-08-24 DIAGNOSIS — M0609 Rheumatoid arthritis without rheumatoid factor, multiple sites: Secondary | ICD-10-CM | POA: Diagnosis not present

## 2024-09-17 ENCOUNTER — Ambulatory Visit (HOSPITAL_COMMUNITY)
Admission: RE | Admit: 2024-09-17 | Discharge: 2024-09-17 | Disposition: A | Discharge status: Home / Self Care | Source: Ambulatory Visit | Attending: Cardiovascular Disease | Admitting: Cardiovascular Disease

## 2024-09-17 DIAGNOSIS — I6523 Occlusion and stenosis of bilateral carotid arteries: Secondary | ICD-10-CM | POA: Insufficient documentation

## 2024-09-21 ENCOUNTER — Ambulatory Visit: Attending: Cardiovascular Disease | Admitting: Cardiovascular Disease

## 2024-09-21 VITALS — BP 168/74 | HR 68 | Ht 66.0 in | Wt 157.0 lb

## 2024-09-21 DIAGNOSIS — E785 Hyperlipidemia, unspecified: Secondary | ICD-10-CM | POA: Diagnosis not present

## 2024-09-21 DIAGNOSIS — I6523 Occlusion and stenosis of bilateral carotid arteries: Secondary | ICD-10-CM | POA: Insufficient documentation

## 2024-09-21 DIAGNOSIS — I350 Nonrheumatic aortic (valve) stenosis: Secondary | ICD-10-CM | POA: Diagnosis not present

## 2024-09-21 DIAGNOSIS — I701 Atherosclerosis of renal artery: Secondary | ICD-10-CM | POA: Diagnosis not present

## 2024-09-21 DIAGNOSIS — I872 Venous insufficiency (chronic) (peripheral): Secondary | ICD-10-CM | POA: Insufficient documentation

## 2024-09-21 DIAGNOSIS — I10 Essential (primary) hypertension: Secondary | ICD-10-CM | POA: Diagnosis not present

## 2024-09-21 NOTE — Patient Instructions (Signed)
 Medication Instructions:  No changes *If you need a refill on your cardiac medications before your next appointment, please call your pharmacy*  Lab Work: None ordered If you have labs (blood work) drawn today and your tests are completely normal, you will receive your results only by: MyChart Message (if you have MyChart) OR A paper copy in the mail If you have any lab test that is abnormal or we need to change your treatment, we will call you to review the results.  Testing/Procedures: Your physician has requested that you have an echocardiogram in 6 months. Echocardiography is a painless test that uses sound waves to create images of your heart. It provides your doctor with information about the size and shape of your heart and how well your heart's chambers and valves are working. You may receive an ultrasound enhancing agent through an IV if needed to better visualize your heart during the echo.This procedure takes approximately one hour. There are no restrictions for this procedure.  This will take place at [address] [facility], 2nd floor  Please note: We ask at that you not bring children with you during ultrasound (echo/ vascular) testing. Due to room size and safety concerns, children are not allowed in the ultrasound rooms during exams. Our front office staff cannot provide observation of children in our lobby area while testing is being conducted. An adult accompanying a patient to their appointment will only be allowed in the ultrasound room at the discretion of the ultrasound technician under special circumstances. We apologize for any inconvenience.   Follow-Up: At [facility], you and your health needs are our priority.  As part of our continuing mission to provide you with exceptional heart care, our providers are all part of one team.  This team includes your primary Cardiologist (physician) and Advanced Practice Providers or APPs (Physician Assistants and Nurse  Practitioners) who all work together to provide you with the care you need, when you need it.  Your next appointment:   6 month(s)  Provider:   Deatrice Cage, MD    We recommend signing up for the patient portal called MyChart.  Sign up information is provided on this After Visit Summary.  MyChart is used to connect with patients for Virtual Visits (Telemedicine).  Patients are able to view lab/test results, encounter notes, upcoming appointments, etc.  Non-urgent messages can be sent to your provider as well.   To learn more about what you can do with MyChart, go to [email or web address].
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
## 2024-09-23 ENCOUNTER — Ambulatory Visit (INDEPENDENT_AMBULATORY_CARE_PROVIDER_SITE_OTHER): Admitting: Orthopaedic Surgery

## 2024-09-23 DIAGNOSIS — M7062 Trochanteric bursitis, left hip: Secondary | ICD-10-CM
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
## 2024-09-24 ENCOUNTER — Ambulatory Visit: Payer: Self-pay | Admitting: Cardiovascular Disease

## 2024-09-24 DIAGNOSIS — I6523 Occlusion and stenosis of bilateral carotid arteries: Secondary | ICD-10-CM

## 2024-10-25 ENCOUNTER — Encounter: Payer: Self-pay | Admitting: Radiology

## 2024-11-15 ENCOUNTER — Other Ambulatory Visit: Payer: Self-pay | Admitting: Internal Medicine

## 2024-11-17 ENCOUNTER — Other Ambulatory Visit: Payer: Self-pay | Admitting: Internal Medicine

## 2024-11-22 DIAGNOSIS — M5030 Other cervical disc degeneration, unspecified cervical region: Secondary | ICD-10-CM | POA: Diagnosis not present

## 2024-11-22 DIAGNOSIS — Z79899 Other long term (current) drug therapy: Secondary | ICD-10-CM | POA: Diagnosis not present

## 2024-11-22 DIAGNOSIS — M5136 Other intervertebral disc degeneration, lumbar region with discogenic back pain only: Secondary | ICD-10-CM | POA: Diagnosis not present

## 2024-11-22 DIAGNOSIS — Z6826 Body mass index (BMI) 26.0-26.9, adult: Secondary | ICD-10-CM | POA: Diagnosis not present

## 2024-11-22 DIAGNOSIS — E663 Overweight: Secondary | ICD-10-CM | POA: Diagnosis not present

## 2024-11-22 DIAGNOSIS — R600 Localized edema: Secondary | ICD-10-CM | POA: Diagnosis not present

## 2024-11-22 DIAGNOSIS — M0609 Rheumatoid arthritis without rheumatoid factor, multiple sites: Secondary | ICD-10-CM | POA: Diagnosis not present

## 2024-11-23 DIAGNOSIS — M5440 Lumbago with sciatica, unspecified side: Secondary | ICD-10-CM | POA: Diagnosis not present

## 2024-12-08 ENCOUNTER — Ambulatory Visit: Admitting: Internal Medicine

## 2024-12-31 ENCOUNTER — Ambulatory Visit: Admitting: Internal Medicine

## 2025-02-09 ENCOUNTER — Ambulatory Visit: Admitting: Internal Medicine

## 2025-03-21 ENCOUNTER — Other Ambulatory Visit (HOSPITAL_COMMUNITY)
